# Patient Record
Sex: Female | Born: 1990 | Race: Black or African American | Hispanic: No | Marital: Single | Smoking: Never smoker
Health system: Southern US, Community
[De-identification: ages and names within clinical notes are randomized; demographics above are authoritative.]

## PROBLEM LIST (undated history)

## (undated) ENCOUNTER — Inpatient Hospital Stay (HOSPITAL_COMMUNITY): Payer: Self-pay

## (undated) DIAGNOSIS — Z8614 Personal history of Methicillin resistant Staphylococcus aureus infection: Secondary | ICD-10-CM

## (undated) DIAGNOSIS — R87629 Unspecified abnormal cytological findings in specimens from vagina: Secondary | ICD-10-CM

## (undated) DIAGNOSIS — B999 Unspecified infectious disease: Secondary | ICD-10-CM

## (undated) DIAGNOSIS — J45909 Unspecified asthma, uncomplicated: Secondary | ICD-10-CM

## (undated) DIAGNOSIS — A599 Trichomoniasis, unspecified: Secondary | ICD-10-CM

## (undated) DIAGNOSIS — R55 Syncope and collapse: Secondary | ICD-10-CM

## (undated) HISTORY — PX: ELBOW SURGERY: SHX618

## (undated) HISTORY — PX: OTHER SURGICAL HISTORY: SHX169

## (undated) HISTORY — PX: GYNECOLOGIC CRYOSURGERY: SHX857

## (undated) HISTORY — PX: FOOT SURGERY: SHX648

---
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
## 2012-03-21 ENCOUNTER — Encounter (HOSPITAL_COMMUNITY): Payer: Self-pay | Admitting: *Deleted

## 2012-03-21 ENCOUNTER — Inpatient Hospital Stay (HOSPITAL_COMMUNITY)
Admission: AD | Admit: 2012-03-21 | Discharge: 2012-03-21 | Disposition: A | Discharge status: Home / Self Care | Payer: Self-pay | Source: Ambulatory Visit | Attending: Obstetrics | Admitting: Obstetrics

## 2012-03-21 DIAGNOSIS — N912 Amenorrhea, unspecified: Secondary | ICD-10-CM | POA: Insufficient documentation

## 2012-03-21 DIAGNOSIS — Z3202 Encounter for pregnancy test, result negative: Secondary | ICD-10-CM | POA: Insufficient documentation

## 2012-03-21 LAB — URINALYSIS, ROUTINE W REFLEX MICROSCOPIC
Bilirubin Urine: NEGATIVE
Leukocytes, UA: NEGATIVE
Nitrite: NEGATIVE
Specific Gravity, Urine: 1.025 (ref 1.005–1.030)
pH: 6 (ref 5.0–8.0)

## 2012-03-21 LAB — POCT PREGNANCY, URINE: Preg Test, Ur: NEGATIVE

## 2012-03-21 NOTE — Discharge Instructions (Signed)
Condoms always for intercourse Make an appointment with Dr. Gaynell Face for additional contraception.

## 2012-03-21 NOTE — MAU Note (Signed)
Patient states she had her last Depo in July, 2012 and has not had a period since that time. States she had vomiting last night after eating shrimp and has been having abdominal pain off and on.

## 2012-03-21 NOTE — MAU Provider Note (Signed)
History     CSN: [id number]  Arrival date and time: 03/21/12 1357   First Provider Initiated Contact with Patient 03/21/12 1455      Chief Complaint  Patient presents with  . Possible Pregnancy   HPI Rachael Oliver 21 y.o. Comes to MAU with no menses for several months.  Stopped Depo in 2012 and has not yet started her menses.  Wondering if she was pregnant.  Does not use condoms.  Had vomiting yesterday after eating shrimp.  Had pancakes today and has had no vomiting today.  Having some lower abdominal tightening but is in no distress.  OB History    Grav Para Term Preterm Abortions TAB SAB Ect Mult Living   1 1 1       1       Past Medical History  Diagnosis Date  . No pertinent past medical history     Past Surgical History  Procedure Date  . Foot surgery     History reviewed. No pertinent family history.  History  Substance Use Topics  . Smoking status: Never Smoker   . Smokeless tobacco: Not on file  . Alcohol Use: No    Allergies: No Known Allergies  Prescriptions prior to admission  Medication Sig Dispense Refill  . ALBUTEROL IN Inhale 2 puffs into the lungs daily as needed. For shortness of breath.        Review of Systems  Gastrointestinal: Negative for nausea, vomiting and abdominal pain.  Genitourinary: Negative for dysuria.   Physical Exam   Blood pressure 139/72, pulse 77, temperature 98.2 F (36.8 C), temperature source Oral, resp. rate 16, height 5\' 4"  (1.626 m), weight 129 lb 12.8 oz (58.877 kg), SpO2 100.00%.  Physical Exam  Nursing note and vitals reviewed. Constitutional: She is oriented to person, place, and time. She appears well-developed and well-nourished.  HENT:  Head: Normocephalic.  Eyes: EOM are normal.  Neck: Neck supple.  Musculoskeletal: Normal range of motion.  Neurological: She is alert and oriented to person, place, and time.  Skin: Skin is warm and dry.  Psychiatric: She has a normal mood and affect.    MAU  Course  Procedures  MDM Results for orders placed during the hospital encounter of 03/21/12 (from the past 24 hour(s))  URINALYSIS, ROUTINE W REFLEX MICROSCOPIC     Status: Abnormal   Collection Time   03/21/12  2:20 PM      Component Value Range   Color, Urine YELLOW  YELLOW    APPearance HAZY (*) CLEAR    Specific Gravity, Urine 1.025  1.005 - 1.030    pH 6.0  5.0 - 8.0    Glucose, UA NEGATIVE  NEGATIVE (mg/dL)   Hgb urine dipstick NEGATIVE  NEGATIVE    Bilirubin Urine NEGATIVE  NEGATIVE    Ketones, ur NEGATIVE  NEGATIVE (mg/dL)   Protein, ur NEGATIVE  NEGATIVE (mg/dL)   Urobilinogen, UA 0.2  0.0 - 1.0 (mg/dL)   Nitrite NEGATIVE  NEGATIVE    Leukocytes, UA NEGATIVE  NEGATIVE   POCT PREGNANCY, URINE     Status: Normal   Collection Time   03/21/12  2:34 PM      Component Value Range   Preg Test, Ur NEGATIVE  NEGATIVE    Client wanted only a pregnancy test.  Is satisfied with negative results.  No vomiting today.  Assessment and Plan  Negative pregnancy test  Plan Condoms always for intercourse Make an appointment with Dr. Gaynell Face for additional  contraception.   Rachael Oliver 03/21/2012, 3:02 PM

## 2012-05-18 ENCOUNTER — Inpatient Hospital Stay (HOSPITAL_COMMUNITY)
Admission: AD | Admit: 2012-05-18 | Discharge: 2012-05-19 | Disposition: A | Discharge status: Home / Self Care | Payer: Medicaid Other | Source: Ambulatory Visit | Attending: Obstetrics | Admitting: Obstetrics

## 2012-05-18 DIAGNOSIS — R109 Unspecified abdominal pain: Secondary | ICD-10-CM

## 2012-05-18 DIAGNOSIS — B9689 Other specified bacterial agents as the cause of diseases classified elsewhere: Secondary | ICD-10-CM | POA: Insufficient documentation

## 2012-05-18 DIAGNOSIS — O26899 Other specified pregnancy related conditions, unspecified trimester: Secondary | ICD-10-CM

## 2012-05-18 DIAGNOSIS — O2390 Unspecified genitourinary tract infection in pregnancy, unspecified trimester: Secondary | ICD-10-CM | POA: Insufficient documentation

## 2012-05-18 DIAGNOSIS — A499 Bacterial infection, unspecified: Secondary | ICD-10-CM | POA: Insufficient documentation

## 2012-05-18 DIAGNOSIS — N760 Acute vaginitis: Secondary | ICD-10-CM | POA: Insufficient documentation

## 2012-05-18 HISTORY — DX: Unspecified asthma, uncomplicated: J45.909

## 2012-05-18 LAB — POCT PREGNANCY, URINE: Preg Test, Ur: POSITIVE — AB

## 2012-05-18 NOTE — MAU Note (Signed)
Pt states, " I miss my June period and now I started having a pulling sensation in my low abdomen for two days."

## 2012-05-19 ENCOUNTER — Inpatient Hospital Stay (HOSPITAL_COMMUNITY): Payer: Medicaid Other

## 2012-05-19 ENCOUNTER — Encounter (HOSPITAL_COMMUNITY): Payer: Self-pay

## 2012-05-19 LAB — WET PREP, GENITAL

## 2012-05-19 LAB — URINALYSIS, ROUTINE W REFLEX MICROSCOPIC
Glucose, UA: NEGATIVE mg/dL
Ketones, ur: 15 mg/dL — AB
Nitrite: NEGATIVE
Protein, ur: NEGATIVE mg/dL
Urobilinogen, UA: 0.2 mg/dL (ref 0.0–1.0)

## 2012-05-19 LAB — CBC
HCT: 34.8 % — ABNORMAL LOW (ref 36.0–46.0)
Hemoglobin: 11.6 g/dL — ABNORMAL LOW (ref 12.0–15.0)
MCH: 26.5 pg (ref 26.0–34.0)
MCHC: 33.3 g/dL (ref 30.0–36.0)
MCV: 79.6 fL (ref 78.0–100.0)
RBC: 4.37 MIL/uL (ref 3.87–5.11)

## 2012-05-19 MED ORDER — METRONIDAZOLE 500 MG PO TABS: 500.0000 mg | ORAL_TABLET | Freq: Two times a day (BID) | ORAL | Status: AC

## 2012-05-19 NOTE — MAU Provider Note (Signed)
History     CSN: [id number]  Arrival date and time: 05/18/12 2318   First Provider Initiated Contact with Patient 05/19/12 0022      Chief Complaint  Patient presents with  . Abdominal Pain  . Amenorrhea   HPI  Pt is [redacted]w[redacted]d and complains of lower abdominal pain that comes and goes.  The pain hurts all day.  The pt has not taken anything for pain.  The pain is 9/10 at its worst then subsides.  The pain lasts for about 3 minutes when it comes.  Pt was on Depo last year with resuming of menses in February.  She denies vaginal discharge, spotting or bleeding, constipation or diarrhea.She has a throbbing headache in the back of her head. She is sensitive to light but no nausea or vomiting, denies blurred vision.  She is hungry all the time.  She has not taken anything for the pain.  Past Medical History  Diagnosis Date  . Asthma     Past Surgical History  Procedure Date  . Foot surgery     History reviewed. No pertinent family history.  History  Substance Use Topics  . Smoking status: Never Smoker   . Smokeless tobacco: Not on file  . Alcohol Use: No    Allergies: No Known Allergies  Prescriptions prior to admission  Medication Sig Dispense Refill  . ALBUTEROL IN Inhale 2 puffs into the lungs daily as needed. For shortness of breath.        Review of Systems  Constitutional: Negative for fever and chills.  Eyes: Negative for blurred vision.  Gastrointestinal: Positive for abdominal pain. Negative for nausea and vomiting.  Genitourinary: Negative for dysuria, urgency and frequency.  Neurological: Positive for headaches.   Physical Exam   Blood pressure 100/76, pulse 94, temperature 98.7 F (37.1 C), temperature source Oral, resp. rate 16, height 5' 4.5" (1.638 m), weight 130 lb (58.968 kg), last menstrual period 03/25/2012.  Physical Exam  Nursing note and vitals reviewed. Constitutional: She is oriented to person, place, and time. She appears well-developed and  well-nourished.  HENT:  Head: Normocephalic.  Eyes: Pupils are equal, round, and reactive to light.  Neck: Normal range of motion. Neck supple.  Cardiovascular: Normal rate and regular rhythm.   Respiratory: Effort normal.  GI: Soft. Bowel sounds are normal. She exhibits no distension. There is tenderness. There is no rebound and no guarding.       Mild left lower quadrant tenderness with palpation-no rebound  Genitourinary:       Mod amount of frothy white discharge in vault; uterus soft ~6week size, nontender; adnexal without palpable enlargement or tenderness  Musculoskeletal: Normal range of motion.  Neurological: She is alert and oriented to person, place, and time.  Skin: Skin is warm and dry.  Psychiatric: She has a normal mood and affect.    MAU Course  Procedures Results for orders placed during the hospital encounter of 05/18/12 (from the past 24 hour(s))  URINALYSIS, ROUTINE W REFLEX MICROSCOPIC     Status: Abnormal   Collection Time   05/18/12 11:54 PM      Component Value Range   Color, Urine YELLOW  YELLOW   APPearance CLEAR  CLEAR   Specific Gravity, Urine >1.030 (*) 1.005 - 1.030   pH 6.0  5.0 - 8.0   Glucose, UA NEGATIVE  NEGATIVE mg/dL   Hgb urine dipstick NEGATIVE  NEGATIVE   Bilirubin Urine NEGATIVE  NEGATIVE   Ketones, ur 15 (*)  NEGATIVE mg/dL   Protein, ur NEGATIVE  NEGATIVE mg/dL   Urobilinogen, UA 0.2  0.0 - 1.0 mg/dL   Nitrite NEGATIVE  NEGATIVE   Leukocytes, UA SMALL (*) NEGATIVE  URINE MICROSCOPIC-ADD ON     Status: Abnormal   Collection Time   05/18/12 11:54 PM      Component Value Range   Squamous Epithelial / LPF RARE  RARE   WBC, UA 3-6  <3 WBC/hpf   RBC / HPF 3-6  <3 RBC/hpf   Bacteria, UA FEW (*) RARE  POCT PREGNANCY, URINE     Status: Abnormal   Collection Time   05/19/12 12:00 AM      Component Value Range   Preg Test, Ur POSITIVE (*) NEGATIVE  CBC     Status: Abnormal   Collection Time   05/19/12 12:31 AM      Component Value Range     WBC 5.5  4.0 - 10.5 K/uL   RBC 4.37  3.87 - 5.11 MIL/uL   Hemoglobin 11.6 (*) 12.0 - 15.0 g/dL   HCT 04.5 (*) 40.9 - 81.1 %   MCV 79.6  78.0 - 100.0 fL   MCH 26.5  26.0 - 34.0 pg   MCHC 33.3  30.0 - 36.0 g/dL   RDW 91.4  78.2 - 95.6 %   Platelets 216  150 - 400 K/uL  HCG, QUANTITATIVE, PREGNANCY     Status: Abnormal   Collection Time   05/19/12 12:31 AM      Component Value Range   hCG, Beta Chain, Quant, S 21308 (*) <5 mIU/mL  WET PREP, GENITAL     Status: Abnormal   Collection Time   05/19/12  1:52 AM      Component Value Range   Yeast Wet Prep HPF POC NONE SEEN  NONE SEEN   Trich, Wet Prep NONE SEEN  NONE SEEN   Clue Cells Wet Prep HPF POC FEW (*) NONE SEEN   WBC, Wet Prep HPF POC MANY (*) NONE SEEN   Technique: Transabdominal ultrasound was performed for evaluation  of the gestation as well as the maternal uterus and adnexal  regions.  Comparison: None.  Intrauterine gestational sac: Visualized/normal in shape.  Yolk sac: Identified  Embryo: Identified  Cardiac Activity: Identified  Heart Rate: 149 bpm  CRL: 7.5 mm six w five d [place] EDC: 01/07/2013  Maternal uterus/Adnexae:  Normal sonographic appearance. No subchorionic hemorrhage  identified. No free fluid.  IMPRESSION:  Single intrauterine gestation with cardiac activity documented.  Estimated age of 6 weeks 5 days by crown-rump length.  Original Report Authenticated By: Waneta Martins, M.D.    Assessment and Plan  Abd pain in pregnancy IUP [redacted]w[redacted]d Bacterial vaginosis-Flagyl 500mg  BID for 7 days F/u with Dr. Gaynell Face for [facility] care  [facility] 05/19/2012, 12:22 AM

## 2012-05-21 LAB — GC/CHLAMYDIA PROBE AMP, GENITAL: GC Probe Amp, Genital: NEGATIVE

## 2012-05-24 ENCOUNTER — Telehealth: Payer: Self-pay | Admitting: Physician Assistant

## 2012-05-24 NOTE — Telephone Encounter (Signed)
na

## 2012-07-28 ENCOUNTER — Encounter (HOSPITAL_COMMUNITY): Payer: Self-pay | Admitting: *Deleted

## 2012-07-28 ENCOUNTER — Emergency Department (HOSPITAL_COMMUNITY)
Admission: EM | Admit: 2012-07-28 | Discharge: 2012-07-28 | Disposition: A | Discharge status: Home / Self Care | Payer: Medicaid Other | Source: Home / Self Care

## 2012-07-28 DIAGNOSIS — B86 Scabies: Secondary | ICD-10-CM

## 2012-07-28 MED ORDER — PERMETHRIN 5 % EX CREA: TOPICAL_CREAM | CUTANEOUS | Status: DC

## 2012-07-28 NOTE — ED Provider Notes (Signed)
History     CSN: [id number]  Arrival date & time 07/28/12  1313   None     Chief Complaint  Patient presents with  . Pruritis    (Consider location/radiation/quality/duration/timing/severity/associated sxs/prior treatment) HPI Comments: 21 year old female that presents  with her child up from both her experiencing itching. The mother states that the father of the child was diagnosed with scabies at the urgent care center a few days ago. He has since been in contact with both the mother of the child and the child. The mother states she is scratching all over and has little small red bumps scattered over her body. Some of them were in the web spaces of the hand.   Past Medical History  Diagnosis Date  . Asthma     Past Surgical History  Procedure Date  . Foot surgery     History reviewed. No pertinent family history.  History  Substance Use Topics  . Smoking status: Never Smoker   . Smokeless tobacco: Not on file  . Alcohol Use: No    OB History    Grav Para Term Preterm Abortions TAB SAB Ect Mult Living   2 1 1       1       Review of Systems  Constitutional: Negative for fever, chills and activity change.  HENT: Negative.   Respiratory: Negative.   Cardiovascular: Negative.   Musculoskeletal: Negative.        As per HPI  Skin: Positive for rash. Negative for color change and pallor.  Neurological: Negative.     Allergies  Review of patient's allergies indicates no known allergies.  Home Medications   Current Outpatient Rx  Name Route Sig Dispense Refill  . ALBUTEROL IN Inhalation Inhale 2 puffs into the lungs daily as needed. For shortness of breath.    Marland Kitchen PERMETHRIN 5 % EX CREA  Apply from chin down, leave on for 8-14 hours, rinse. Repeat in 1 week 60 g 0    BP 113/60  Pulse 70  Temp 98.2 F (36.8 C) (Oral)  Resp 17  SpO2 100%  LMP 03/25/2012  Physical Exam  Constitutional: She is oriented to person, place, and time. She appears well-developed  and well-nourished. She appears distressed.  Neck: Normal range of motion. Neck supple.  Musculoskeletal: Normal range of motion. She exhibits no edema and no tenderness.  Neurological: She is alert and oriented to person, place, and time. No cranial nerve deficit.  Skin: Skin is warm and dry.        2 small red papules to the trunk and extremities. There are 2 burrowing-type lesions on the left hand. She is scratching during the exam.     ED Course  Procedures (including critical care time)  Labs Reviewed - No data to display No results found.   1. Scabies       MDM  elimite as directed        Hayden Rasmussen, NP 07/28/12 1441

## 2012-07-28 NOTE — ED Notes (Signed)
Pt with onset of itching no visible rash last night

## 2012-07-29 NOTE — ED Provider Notes (Signed)
Medical screening examination/treatment/procedure(s) were performed by non-physician practitioner and as supervising physician I was immediately available for consultation/collaboration.  Lucelia Lacey, M.D.   Zykerria Tanton C Gustavo Dispenza, MD 07/29/12 0829 

## 2013-06-18 ENCOUNTER — Emergency Department (HOSPITAL_COMMUNITY)
Admission: EM | Admit: 2013-06-18 | Discharge: 2013-06-18 | Disposition: A | Discharge status: Home / Self Care | Payer: Medicaid Other | Attending: Emergency Medicine | Admitting: Emergency Medicine

## 2013-06-18 ENCOUNTER — Encounter (HOSPITAL_COMMUNITY): Payer: Self-pay | Admitting: Emergency Medicine

## 2013-06-18 ENCOUNTER — Emergency Department (HOSPITAL_COMMUNITY): Payer: Medicaid Other

## 2013-06-18 DIAGNOSIS — Y929 Unspecified place or not applicable: Secondary | ICD-10-CM | POA: Insufficient documentation

## 2013-06-18 DIAGNOSIS — Z79899 Other long term (current) drug therapy: Secondary | ICD-10-CM | POA: Insufficient documentation

## 2013-06-18 DIAGNOSIS — S60051A Contusion of right little finger without damage to nail, initial encounter: Secondary | ICD-10-CM

## 2013-06-18 DIAGNOSIS — W2209XA Striking against other stationary object, initial encounter: Secondary | ICD-10-CM | POA: Insufficient documentation

## 2013-06-18 DIAGNOSIS — S6000XA Contusion of unspecified finger without damage to nail, initial encounter: Secondary | ICD-10-CM | POA: Insufficient documentation

## 2013-06-18 DIAGNOSIS — Y939 Activity, unspecified: Secondary | ICD-10-CM | POA: Insufficient documentation

## 2013-06-18 DIAGNOSIS — J45909 Unspecified asthma, uncomplicated: Secondary | ICD-10-CM | POA: Insufficient documentation

## 2013-06-18 MED ORDER — IBUPROFEN 800 MG PO TABS: 800.0000 mg | ORAL_TABLET | Freq: Three times a day (TID) | ORAL | Status: DC

## 2013-06-18 MED ORDER — IBUPROFEN 400 MG PO TABS
800.0000 mg | ORAL_TABLET | Freq: Once | ORAL | Status: AC
  Administered 2013-06-18: 800 mg via ORAL
  Filled 2013-06-18: qty 2

## 2013-06-18 NOTE — Progress Notes (Signed)
Orthopedic Tech Progress Note Patient Details:  Rachael Oliver Sep 04, 1991 [id number]  Ortho Devices Type of Ortho Device: Finger splint Ortho Device/Splint Location: RUE Ortho Device/Splint Interventions: Ordered;Application   [name] 06/18/2013, 4:44 PM

## 2013-06-18 NOTE — ED Notes (Signed)
Pt c/o right pinky finger injury after hitting on furniture

## 2013-06-18 NOTE — ED Notes (Signed)
Patient transported to X-ray 

## 2013-06-18 NOTE — ED Notes (Signed)
Pt. Hit rt. Little finger about 2-3 hours ago.

## 2013-06-18 NOTE — ED Provider Notes (Signed)
CSN: [id number]     Arrival date & time 06/18/13  1447 History  This chart was scribed for non-physician practitioner, Rachael Forth, PA-C working with Rachael Hait, MD by Rachael Oliver, ED scribe. This patient was seen in room TR11C/TR11C and the patient's care was started at 3:23 PM.   Chief Complaint  Patient presents with  . Finger Injury   The history is provided by the patient. No language interpreter was used.    HPI Comments: Rachael Oliver is a 22 y.o. female who presents to the Emergency Department complaining of sudden onset right pinky finger injury with associated swelling and bruising that started 3 hours ago after she dropped a wooden bench onto her finger. She states there is tingling in her pinky, but that she can move it.  Pt denies fever and chills as associated symptoms.    Past Medical History  Diagnosis Date  . Asthma    Past Surgical History  Procedure Laterality Date  . Foot surgery     History reviewed. No pertinent family history. History  Substance Use Topics  . Smoking status: Never Smoker   . Smokeless tobacco: Not on file  . Alcohol Use: No   OB History   Grav Para Term Preterm Abortions TAB SAB Ect Mult Living   2 1 1       1      Review of Systems  Constitutional: Negative for fever, diaphoresis, appetite change, fatigue and unexpected weight change.  HENT: Negative for mouth sores and neck stiffness.   Eyes: Negative for visual disturbance.  Respiratory: Negative for cough, chest tightness, shortness of breath and wheezing.   Cardiovascular: Negative for chest pain.  Gastrointestinal: Negative for nausea, vomiting, abdominal pain, diarrhea and constipation.  Endocrine: Negative for polydipsia, polyphagia and polyuria.  Genitourinary: Negative for dysuria, urgency, frequency and hematuria.  Musculoskeletal: Positive for arthralgias. Negative for back pain.  Skin: Negative for rash.  Allergic/Immunologic: Negative for  immunocompromised state.  Neurological: Negative for syncope, light-headedness, numbness and headaches.  Hematological: Does not bruise/bleed easily.  Psychiatric/Behavioral: Negative for sleep disturbance. The patient is not nervous/anxious.   All other systems reviewed and are negative.    Allergies  Review of patient's allergies indicates no known allergies.  Home Medications   Current Outpatient Rx  Name  Oliver  Sig  Dispense  Refill  . albuterol (PROVENTIL HFA;VENTOLIN HFA) 108 (90 BASE) MCG/ACT inhaler   Inhalation   Inhale 1 puff into the lungs every 6 (six) hours as needed for wheezing.         Marland Kitchen ibuprofen (ADVIL,MOTRIN) 800 MG tablet   Oral   Take 1 tablet (800 mg total) by mouth 3 (three) times daily.   21 tablet   0    BP 124/71  Pulse 72  Temp(Src) 98.1 F (36.7 C) (Oral)  Resp 18  SpO2 99%  Breastfeeding? Unknown  Physical Exam  Nursing note and vitals reviewed. Constitutional: She is oriented to person, place, and time. She appears well-developed and well-nourished. No distress.  HENT:  Head: Normocephalic and atraumatic.  Eyes: Conjunctivae are normal.  Neck: Normal range of motion.  Cardiovascular: Normal rate, regular rhythm, normal heart sounds and intact distal pulses.   No murmur heard. Capillary refill < 3 seconds  Pulmonary/Chest: Effort normal and breath sounds normal. No respiratory distress. She has no wheezes.  Musculoskeletal: She exhibits tenderness. She exhibits no edema.  ROM: Full ROM of PIP and mild decreased ROM of MCP secondary  to pain.  Tenderness beginning at PIP joint and extending down to MCP of right little finger.   Neurological: She is alert and oriented to person, place, and time. Coordination normal.  Sensation intact Strength 5/5 including resisted flexion and extension of the right little finger  Skin: Skin is warm and dry. She is not diaphoretic. No erythema.  No tenting of the skin Ecchymosis of the dorsal aspect of  the right little finger over extending from the MCP joint to the PCP joint  Psychiatric: She has a normal mood and affect.    ED Course   Procedures (including critical care time)  DIAGNOSTIC STUDIES: Oxygen Saturation is 99% on RA, normal by my interpretation.    COORDINATION OF CARE: 4:32 PM-Discussed treatment plan which includes splint with pt at bedside and pt agreed to plan.   Labs Reviewed - No data to display Dg Finger Little Right  06/18/2013   *RADIOLOGY REPORT*  Clinical Data: Pain post trauma  RIGHT FIFTH FINGER 2+V  Comparison: None.  Findings: Frontal, oblique, and lateral views were obtained.  There is no demonstrable fracture or dislocation.  Joint spaces appear intact.  No erosive change.  IMPRESSION: No abnormality noted.   Original Report Authenticated By: Rachael Oliver, M.D.   1. Contusion of right little finger without damage to nail, initial encounter     MDM  Rachael Oliver presents with contusion to the right little finger involving the PIP and MCP joints.  Patient X-Ray negative for obvious fracture or dislocation. I personally reviewed the imaging tests through PACS system.  I reviewed available ER/hospitalization records through the EMR.  Pain managed in ED. Pt advised to follow up with orthopedics if symptoms persist for possibility of missed fracture diagnosis. Patient given finger splint while in ED, conservative therapy recommended and discussed. Patient will be dc home & is agreeable with above plan.  I have also discussed reasons to return immediately to the ER.  Patient expresses understanding and agrees with plan.  I personally performed the services described in this documentation, which was scribed in my presence. The recorded information has been reviewed and is accurate.    Rachael Client Caelyn Route, PA-C 06/18/13 1659

## 2013-06-19 NOTE — ED Provider Notes (Signed)
Medical screening examination/treatment/procedure(s) were performed by non-physician practitioner and as supervising physician I was immediately available for consultation/collaboration.   William Hannelore Bova, MD 06/19/13 0939 

## 2013-07-15 ENCOUNTER — Emergency Department (HOSPITAL_COMMUNITY)
Admission: EM | Admit: 2013-07-15 | Discharge: 2013-07-15 | Disposition: A | Discharge status: Home / Self Care | Payer: Medicaid Other | Attending: Emergency Medicine | Admitting: Emergency Medicine

## 2013-07-15 ENCOUNTER — Encounter (HOSPITAL_COMMUNITY): Payer: Self-pay | Admitting: Emergency Medicine

## 2013-07-15 DIAGNOSIS — N3090 Cystitis, unspecified without hematuria: Secondary | ICD-10-CM | POA: Insufficient documentation

## 2013-07-15 DIAGNOSIS — N898 Other specified noninflammatory disorders of vagina: Secondary | ICD-10-CM | POA: Insufficient documentation

## 2013-07-15 DIAGNOSIS — Z79899 Other long term (current) drug therapy: Secondary | ICD-10-CM | POA: Insufficient documentation

## 2013-07-15 DIAGNOSIS — R109 Unspecified abdominal pain: Secondary | ICD-10-CM | POA: Insufficient documentation

## 2013-07-15 DIAGNOSIS — R319 Hematuria, unspecified: Secondary | ICD-10-CM | POA: Insufficient documentation

## 2013-07-15 DIAGNOSIS — J45909 Unspecified asthma, uncomplicated: Secondary | ICD-10-CM | POA: Insufficient documentation

## 2013-07-15 DIAGNOSIS — O200 Threatened abortion: Secondary | ICD-10-CM

## 2013-07-15 DIAGNOSIS — Z3201 Encounter for pregnancy test, result positive: Secondary | ICD-10-CM

## 2013-07-15 DIAGNOSIS — O9989 Other specified diseases and conditions complicating pregnancy, childbirth and the puerperium: Secondary | ICD-10-CM | POA: Insufficient documentation

## 2013-07-15 LAB — URINE MICROSCOPIC-ADD ON

## 2013-07-15 LAB — URINALYSIS, ROUTINE W REFLEX MICROSCOPIC
Protein, ur: NEGATIVE mg/dL
Urobilinogen, UA: 1 mg/dL (ref 0.0–1.0)

## 2013-07-15 LAB — POCT PREGNANCY, URINE: Preg Test, Ur: POSITIVE — AB

## 2013-07-15 LAB — WET PREP, GENITAL: Yeast Wet Prep HPF POC: NONE SEEN

## 2013-07-15 MED ORDER — CEPHALEXIN 500 MG PO CAPS: 500.0000 mg | ORAL_CAPSULE | Freq: Four times a day (QID) | ORAL | Status: DC

## 2013-07-15 NOTE — ED Provider Notes (Signed)
CSN: [id number]     Arrival date & time 07/15/13  1901 History   First MD Initiated Contact with Patient 07/15/13 2146     Chief Complaint  Patient presents with  . Hematuria  . Vaginal Discharge   (Consider location/radiation/quality/duration/timing/severity/associated sxs/prior Treatment) HPI 22 year old female G2P1102 (SVDs, no issues during pregnancy per pt) that is here with multiple complaints including intermittent lower crampy abdominal pain, blood in her urine and a small amount of dark vaginal bleeding. She reports the abdominal pain began yesterday. It is located in her lower abdomen. She reports it comes and goes. She has no abdominal pain currently.  In regards to her hematuria she reports it has also been present since yesterday. She has not had any dysuria, burning with urination or frequency. Lastly she reported some scant vaginal bleeding. This has also been present since yesterday. She states it has only consisted of a small amount of dark spotting several times in the past 2 days. She denies fevers, chills, nausea, vomiting, diarrhea, loss of appetite, weight change. LMP 06/07/13. Triage UPT was positive.  Past Medical History  Diagnosis Date  . Asthma    Past Surgical History  Procedure Laterality Date  . Foot surgery     No family history on file. History  Substance Use Topics  . Smoking status: Never Smoker   . Smokeless tobacco: Not on file  . Alcohol Use: No   OB History   Grav Para Term Preterm Abortions TAB SAB Ect Mult Living   2 1 1       1      Review of Systems  Constitutional: Negative for fever and chills.  HENT: Negative for congestion and sore throat.   Respiratory: Negative for chest tightness and shortness of breath.   Cardiovascular: Negative for chest pain and palpitations.  Gastrointestinal: Negative for nausea, vomiting, abdominal pain and diarrhea.  Genitourinary: Negative for dysuria, frequency, flank pain, vaginal discharge and vaginal  pain.  Musculoskeletal: Negative for joint swelling and gait problem.  Skin: Negative for rash.  Hematological: Does not bruise/bleed easily.  All other systems reviewed and are negative.    Allergies  Review of patient's allergies indicates no known allergies.  Home Medications   Current Outpatient Rx  Name  Route  Sig  Dispense  Refill  . albuterol (PROVENTIL HFA;VENTOLIN HFA) 108 (90 BASE) MCG/ACT inhaler   Inhalation   Inhale 1 puff into the lungs every 6 (six) hours as needed for wheezing.          BP 129/63  Pulse 87  Temp(Src) 98.7 F (37.1 C) (Oral)  Resp 16  Ht 5\' 4"  (1.626 m)  SpO2 99%  LMP 06/15/2013 Physical Exam  Vitals reviewed. Constitutional: She is oriented to person, place, and time. She appears well-developed and well-nourished.  HENT:  Right Ear: External ear normal.  Left Ear: External ear normal.  Mouth/Throat: No oropharyngeal exudate.  Eyes: Conjunctivae and EOM are normal.  Neck: Normal range of motion. Neck supple.  Cardiovascular: Normal rate, regular rhythm, normal heart sounds and intact distal pulses.  Exam reveals no gallop and no friction rub.   No murmur heard. Pulmonary/Chest: Effort normal and breath sounds normal.  Abdominal: Soft. Bowel sounds are normal. She exhibits no distension. There is no tenderness. There is no rebound and no guarding.  Genitourinary: There is no rash, tenderness or lesion on the right labia. There is no rash, tenderness or lesion on the left labia. Cervix exhibits no motion tenderness,  no discharge and no friability. Right adnexum displays no mass and no tenderness. Left adnexum displays no mass and no tenderness. No erythema or tenderness around the vagina. Vaginal discharge (scant, white) found.  Musculoskeletal: Normal range of motion. She exhibits no edema.  Lymphadenopathy:       Right: No inguinal adenopathy present.       Left: No inguinal adenopathy present.  Neurological: She is alert and oriented  to person, place, and time. No cranial nerve deficit.  Skin: Skin is warm and dry. No rash noted.  Psychiatric: She has a normal mood and affect.    ED Course  Procedures (including critical care time) Labs Review Labs Reviewed  WET PREP, GENITAL - Abnormal; Notable for the following:    Clue Cells Wet Prep HPF POC FEW (*)    WBC, Wet Prep HPF POC TOO NUMEROUS TO COUNT (*)    All other components within normal limits  URINALYSIS, ROUTINE W REFLEX MICROSCOPIC - Abnormal; Notable for the following:    Hgb urine dipstick TRACE (*)    Leukocytes, UA SMALL (*)    All other components within normal limits  URINE MICROSCOPIC-ADD ON - Abnormal; Notable for the following:    Bacteria, UA FEW (*)    All other components within normal limits  HCG, QUANTITATIVE, PREGNANCY - Abnormal; Notable for the following:    hCG, Beta Chain, Quant, S 191 (*)    All other components within normal limits  POCT PREGNANCY, URINE - Abnormal; Notable for the following:    Preg Test, Ur POSITIVE (*)    All other components within normal limits  GC/CHLAMYDIA PROBE AMP  URINE CULTURE  ABO/RH   Imaging Review No results found.  MDM   22 year old female here with crampy abdominal pain, scant vaginal bleeding and hematuria. UPT from triage was positive. She has no abdominal pain currently. Her abdominal exam is benign. Pelvic exam was benign. She does not know her blood type. Pregnancy screen ordered. Given how early she is in her pregnancy beta hCG Quant was ordered. Will make decisions regarding whether or not to obtain ultrasound imaging based on this value.  Will tx as cystitis.  11:39 PM beta Quant came back at 191. We do not feel that an ultrasound we'll review the location of her pregnancy. She had absolutely no evidence of bleeding on her pelvic exam. Her abdominal exam is completely benign. This felt at this time she is safe and stable for discharge. She indicated that she will followup with her OB/GYN.  Strict return precautions were reviewed with the patient including worsening abdominal pain and increased bleeding.  She was also counseled in regard to a diagnosis of threatened miscarriage.  Clinical Impression: 1. Positive pregnancy test   2. Cystitis   3. Threatened miscarriage in early pregnancy     Disposition: Discharge  Condition: Good  I have discussed the results, Dx and Tx plan. They expressed understanding and agree with the plan and were told to return to ED with any worsening of condition or concern.    Discharge Medication List as of 07/15/2013 11:34 PM    START taking these medications   Details  cephALEXin (KEFLEX) 500 MG capsule Take 1 capsule (500 mg total) by mouth 4 (four) times daily., Starting 07/15/2013, Until Discontinued, Print        Follow Up: Followup with your OB/GYN as discussed     Pt seen in conjunction with Dr. Wilkie Aye.  Reine Just. Beverely Pace, MD Emergency  Medicine PGY-III [phone]  Addendum: Rx for Flagyl called in to [facility] on [address]  500 mg BID x 7d #14.  Pt contacted and she is doing well.  No vaginal bleeding or abdominal pain. 4:14 PM 07/16/13    Reine Just. Beverely Pace, MD Emergency Medicine PGY-III  Oleh Genin, MD 07/16/13 [phone]

## 2013-07-15 NOTE — ED Notes (Signed)
Pt. reports vaginal discharge and hematuria onset yesterday with low abdominal cramping. Denies fever or chills.

## 2013-07-15 NOTE — ED Provider Notes (Signed)
I saw and evaluated the patient, reviewed the resident's and I agree with the findings and plan.  This is a 22 year old female who presents with vaginal discharge or bleeding. The patient is approximately [redacted] weeks pregnant with last menstrual period on August 1. She reports one-day history of vaginal bleeding. The patient denied any abdominal pain or lateralizing pain to me. She is nontoxic-appearing and her vital signs are within normal limits. Her abdominal exam is benign and her the resident's note, her pelvic exam is benign as well without evidence of bleeding or adnexal tenderness. Beta hCG was 191. I discussed with the patient that at this level, I would not expect to see an intrauterine pregnancy. However, also cannot rule out miscarriage versus ectopic pregnancy. Given the patient's reassuring exam, she will need followup for repeat beta and ultrasound. The patient was given strict return precautions if she begins having abdominal pain, worsening bleeding, or any other concerning symptoms. Patient stated understanding.  After history, exam, and medical workup I feel the patient has been appropriately medically screened and is safe for discharge home. Pertinent diagnoses were discussed with the patient. Patient was given return precautions.    Shon Baton, MD 07/15/13 [phone]

## 2013-07-15 NOTE — ED Notes (Signed)
Patient noticed yesterday at noon, a discharge, odor from vagina, hematuria, and pain associated, LMP 06/15/13, no birth control, patient is sexually active.

## 2013-07-16 LAB — URINE CULTURE: Culture: NO GROWTH

## 2013-07-16 LAB — GC/CHLAMYDIA PROBE AMP: GC Probe RNA: NEGATIVE

## 2013-07-16 NOTE — ED Provider Notes (Signed)
I saw and evaluated the patient, reviewed the resident's note and I agree with the findings and plan.  Shon Baton, MD 07/16/13 [phone]

## 2013-08-08 ENCOUNTER — Inpatient Hospital Stay (HOSPITAL_COMMUNITY)
Admission: AD | Admit: 2013-08-08 | Discharge: 2013-08-08 | Disposition: A | Discharge status: Home / Self Care | Payer: Medicaid Other | Source: Ambulatory Visit | Attending: Obstetrics | Admitting: Obstetrics

## 2013-08-08 ENCOUNTER — Inpatient Hospital Stay (HOSPITAL_COMMUNITY): Payer: Medicaid Other

## 2013-08-08 ENCOUNTER — Encounter (HOSPITAL_COMMUNITY): Payer: Self-pay

## 2013-08-08 DIAGNOSIS — O26891 Other specified pregnancy related conditions, first trimester: Secondary | ICD-10-CM

## 2013-08-08 DIAGNOSIS — N949 Unspecified condition associated with female genital organs and menstrual cycle: Secondary | ICD-10-CM | POA: Insufficient documentation

## 2013-08-08 DIAGNOSIS — O99891 Other specified diseases and conditions complicating pregnancy: Secondary | ICD-10-CM | POA: Insufficient documentation

## 2013-08-08 DIAGNOSIS — O9989 Other specified diseases and conditions complicating pregnancy, childbirth and the puerperium: Secondary | ICD-10-CM

## 2013-08-08 DIAGNOSIS — R109 Unspecified abdominal pain: Secondary | ICD-10-CM | POA: Insufficient documentation

## 2013-08-08 LAB — URINALYSIS, ROUTINE W REFLEX MICROSCOPIC
Bilirubin Urine: NEGATIVE
Hgb urine dipstick: NEGATIVE
Ketones, ur: NEGATIVE mg/dL
Protein, ur: NEGATIVE mg/dL
Urobilinogen, UA: 0.2 mg/dL (ref 0.0–1.0)

## 2013-08-08 LAB — CBC
MCH: 26.5 pg (ref 26.0–34.0)
MCHC: 33.5 g/dL (ref 30.0–36.0)
MCV: 79 fL (ref 78.0–100.0)
Platelets: 262 10*3/uL (ref 150–400)
RDW: 13.7 % (ref 11.5–15.5)
WBC: 5.1 10*3/uL (ref 4.0–10.5)

## 2013-08-08 NOTE — MAU Note (Signed)
Pt states she has lower abdominal pain (rates 9 out of 10) and feels weak.

## 2013-08-08 NOTE — MAU Note (Signed)
Patient states she has been having lower abdominal pain since last night. States she feels fatigue. Denies bleeding or discharge.

## 2013-08-08 NOTE — MAU Provider Note (Signed)
History     CSN: [id number]  Arrival date and time: 08/08/13 1537   First Provider Initiated Contact with Patient 08/08/13 1646      Chief Complaint  Patient presents with  . Abdominal Pain  . Fatigue   Abdominal Pain    Rachael Oliver is a 22 y.o. G3P1001 at [redacted]w[redacted]d who presents today with feeling weak and abdominal pain. She had a + pregnancy test on 9/8. She states that she has not been vomiting. She denies any bleeding. She states that she has intermittent 9/10 cramping. She states that the cramping started last night. She states that she last had intercourse about 2 weeks ago. She is planning on seeing Dr. Gaynell Face for [facility].   Past Medical History  Diagnosis Date  . Asthma     Past Surgical History  Procedure Laterality Date  . Foot surgery      Family History  Problem Relation Age of Onset  . Alcohol abuse Neg Hx   . Arthritis Neg Hx   . Asthma Neg Hx   . Birth defects Neg Hx   . COPD Neg Hx   . Cancer Neg Hx   . Depression Neg Hx   . Diabetes Neg Hx   . Drug abuse Neg Hx   . Early death Neg Hx   . Hearing loss Neg Hx   . Heart disease Neg Hx   . Hyperlipidemia Neg Hx   . Hypertension Neg Hx   . Kidney disease Neg Hx   . Learning disabilities Neg Hx   . Mental illness Neg Hx   . Mental retardation Neg Hx   . Miscarriages / Stillbirths Neg Hx   . Stroke Neg Hx   . Vision loss Neg Hx     History  Substance Use Topics  . Smoking status: Never Smoker   . Smokeless tobacco: Not on file  . Alcohol Use: No    Allergies: No Known Allergies  Prescriptions prior to admission  Medication Sig Dispense Refill  . albuterol (PROVENTIL HFA;VENTOLIN HFA) 108 (90 BASE) MCG/ACT inhaler Inhale 1 puff into the lungs every 6 (six) hours as needed for wheezing.      . cephALEXin (KEFLEX) 500 MG capsule Take 1 capsule (500 mg total) by mouth 4 (four) times daily.  12 capsule  0    Review of Systems  Gastrointestinal: Positive for abdominal pain.   Physical Exam    Blood pressure 107/53, pulse 74, temperature 98.3 F (36.8 C), temperature source Oral, resp. rate 16, height 5' 4.5" (1.638 m), weight 58.514 kg (129 lb), last menstrual period 06/15/2013, SpO2 100.00%.  Physical Exam  Nursing note and vitals reviewed. Constitutional: She is oriented to person, place, and time. She appears well-developed and well-nourished. No distress.  Cardiovascular: Normal rate.   Respiratory: Effort normal.  GI: Soft. There is no tenderness.  Neurological: She is alert and oriented to person, place, and time.  Skin: Skin is warm.  Psychiatric: She has a normal mood and affect.    MAU Course  Procedures  Results for orders placed during the hospital encounter of 08/08/13 (from the past 24 hour(s))  URINALYSIS, ROUTINE W REFLEX MICROSCOPIC     Status: Abnormal   Collection Time    08/08/13  4:10 PM      Result Value Range   Color, Urine YELLOW  YELLOW   APPearance HAZY (*) CLEAR   Specific Gravity, Urine 1.025  1.005 - 1.030   pH 6.5  5.0 -  8.0   Glucose, UA NEGATIVE  NEGATIVE mg/dL   Hgb urine dipstick NEGATIVE  NEGATIVE   Bilirubin Urine NEGATIVE  NEGATIVE   Ketones, ur NEGATIVE  NEGATIVE mg/dL   Protein, ur NEGATIVE  NEGATIVE mg/dL   Urobilinogen, UA 0.2  0.0 - 1.0 mg/dL   Nitrite NEGATIVE  NEGATIVE   Leukocytes, UA SMALL (*) NEGATIVE  URINE MICROSCOPIC-ADD ON     Status: Abnormal   Collection Time    08/08/13  4:10 PM      Result Value Range   Squamous Epithelial / LPF FEW (*) RARE   WBC, UA 3-6  <3 WBC/hpf   Urine-Other MUCOUS PRESENT    CBC     Status: Abnormal   Collection Time    08/08/13  4:45 PM      Result Value Range   WBC 5.1  4.0 - 10.5 K/uL   RBC 3.96  3.87 - 5.11 MIL/uL   Hemoglobin 10.5 (*) 12.0 - 15.0 g/dL   HCT 04.5 (*) 40.9 - 81.1 %   MCV 79.0  78.0 - 100.0 fL   MCH 26.5  26.0 - 34.0 pg   MCHC 33.5  30.0 - 36.0 g/dL   RDW 91.4  78.2 - 95.6 %   Platelets 262  150 - 400 K/uL  HCG, QUANTITATIVE, PREGNANCY     Status:  Abnormal   Collection Time    08/08/13  4:45 PM      Result Value Range   hCG, Beta Chain, Quant, S 95280 (*) <5 mIU/mL   US Ob Comp Less 14 Wks  08/08/2013   CLINICAL DATA:  Pelvic pain. Uncertain LMP.  EXAM: OBSTETRIC <14 WK ULTRASOUND  TECHNIQUE: Transabdominal ultrasound was performed for evaluation of the gestation as well as the maternal uterus and adnexal regions.  COMPARISON:  None.  FINDINGS: Intrauterine gestational sac: Visualized/normal in shape.  Yolk sac:  Visualized  Embryo:  Visualized  Cardiac Activity: Visualized  Heart Rate: 161 bpm  CRL:   10  mm   7 w 1 d                  [place] EDC: 03/26/14  Maternal uterus/adnexae: No abnormality identified. Both ovaries are normal in appearance.  IMPRESSION: Single living IUP measuring 7 weeks 1 day with [place] EDC of 03/26/14.  No significant maternal uterine or adnexal abnormality identified.   Electronically Signed   By: Myles Rosenthal   On: 08/08/2013 17:44    Assessment and Plan   1. Pelvic pain complicating pregnancy, antepartum, first trimester    Danger signs reviewed [facility] as soon as possible Return to MAU as needed   Tawnya Crook 08/08/2013, 4:47 PM

## 2013-09-20 ENCOUNTER — Emergency Department (HOSPITAL_COMMUNITY): Payer: Medicaid Other

## 2013-09-20 ENCOUNTER — Emergency Department (HOSPITAL_COMMUNITY)
Admission: EM | Admit: 2013-09-20 | Discharge: 2013-09-20 | Disposition: A | Discharge status: Home / Self Care | Payer: Medicaid Other | Attending: Emergency Medicine | Admitting: Emergency Medicine

## 2013-09-20 ENCOUNTER — Encounter (HOSPITAL_COMMUNITY): Payer: Self-pay | Admitting: Emergency Medicine

## 2013-09-20 DIAGNOSIS — J45909 Unspecified asthma, uncomplicated: Secondary | ICD-10-CM | POA: Insufficient documentation

## 2013-09-20 DIAGNOSIS — IMO0002 Reserved for concepts with insufficient information to code with codable children: Secondary | ICD-10-CM | POA: Insufficient documentation

## 2013-09-20 DIAGNOSIS — O9989 Other specified diseases and conditions complicating pregnancy, childbirth and the puerperium: Secondary | ICD-10-CM | POA: Insufficient documentation

## 2013-09-20 DIAGNOSIS — Z79899 Other long term (current) drug therapy: Secondary | ICD-10-CM | POA: Insufficient documentation

## 2013-09-20 DIAGNOSIS — R1310 Dysphagia, unspecified: Secondary | ICD-10-CM | POA: Insufficient documentation

## 2013-09-20 DIAGNOSIS — R319 Hematuria, unspecified: Secondary | ICD-10-CM | POA: Insufficient documentation

## 2013-09-20 DIAGNOSIS — S0990XA Unspecified injury of head, initial encounter: Secondary | ICD-10-CM | POA: Insufficient documentation

## 2013-09-20 LAB — URINE MICROSCOPIC-ADD ON

## 2013-09-20 LAB — URINALYSIS, ROUTINE W REFLEX MICROSCOPIC
Bilirubin Urine: NEGATIVE
Glucose, UA: NEGATIVE mg/dL
Specific Gravity, Urine: 1.017 (ref 1.005–1.030)
pH: 7 (ref 5.0–8.0)

## 2013-09-20 MED ORDER — ACETAMINOPHEN 325 MG PO TABS
650.0000 mg | ORAL_TABLET | Freq: Once | ORAL | Status: AC
  Administered 2013-09-20: 650 mg via ORAL
  Filled 2013-09-20: qty 2
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
## 2013-09-20 NOTE — ED Notes (Signed)
Patient transported to Ultrasound 
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
## 2013-09-20 NOTE — ED Notes (Signed)
Pelvic exam performed by Dr. Rubin Payor with lady nurse chaperone .
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
## 2013-09-20 NOTE — ED Notes (Signed)
Attempted to get FHR but pt too early in pregnancy to obtain.

## 2013-09-20 NOTE — ED Provider Notes (Signed)
6:58 PM I discussed all results with the patient, a colleague, and she was in no distress, with no new complaints. Ultrasound was largely reassuring.  I specifically instructed her in return precautions, follow up instructions. The patient left prior to receiving her paperwork.  Gerhard Munch, MD 09/20/13 [phone]

## 2013-09-21 LAB — URINE CULTURE

## 2013-10-15 ENCOUNTER — Encounter (HOSPITAL_COMMUNITY): Payer: Self-pay

## 2013-10-15 ENCOUNTER — Inpatient Hospital Stay (HOSPITAL_COMMUNITY)
Admission: AD | Admit: 2013-10-15 | Discharge: 2013-10-15 | Disposition: A | Discharge status: Home / Self Care | Payer: Medicaid Other | Source: Ambulatory Visit | Attending: Obstetrics | Admitting: Obstetrics

## 2013-10-15 DIAGNOSIS — B9689 Other specified bacterial agents as the cause of diseases classified elsewhere: Secondary | ICD-10-CM | POA: Insufficient documentation

## 2013-10-15 DIAGNOSIS — N760 Acute vaginitis: Secondary | ICD-10-CM

## 2013-10-15 DIAGNOSIS — O2390 Unspecified genitourinary tract infection in pregnancy, unspecified trimester: Secondary | ICD-10-CM | POA: Insufficient documentation

## 2013-10-15 DIAGNOSIS — A499 Bacterial infection, unspecified: Secondary | ICD-10-CM

## 2013-10-15 DIAGNOSIS — N949 Unspecified condition associated with female genital organs and menstrual cycle: Secondary | ICD-10-CM | POA: Insufficient documentation

## 2013-10-15 LAB — URINALYSIS, ROUTINE W REFLEX MICROSCOPIC
Bilirubin Urine: NEGATIVE
Glucose, UA: NEGATIVE mg/dL
Nitrite: NEGATIVE
Specific Gravity, Urine: 1.02 (ref 1.005–1.030)
Urobilinogen, UA: 0.2 mg/dL (ref 0.0–1.0)

## 2013-10-15 LAB — WET PREP, GENITAL

## 2013-10-15 LAB — URINE MICROSCOPIC-ADD ON

## 2013-10-15 MED ORDER — METRONIDAZOLE 500 MG PO TABS: 500.0000 mg | ORAL_TABLET | Freq: Two times a day (BID) | ORAL | Status: DC

## 2013-10-15 NOTE — MAU Provider Note (Signed)
History     CSN: [id number]  Arrival date and time: 10/15/13 1418   First Provider Initiated Contact with Patient 10/15/13 1637      No chief complaint on file.  HPI  Rachael Oliver is a 22 y.o. female G3P2002 at [redacted]w[redacted]d who presents with vaginal discharge; pt feels she has a yeast infection or "something". The discharge is described as white/yellow with an odor. Denies itching or burning. +fetal movement, denies bleeding.   OB History   Grav Para Term Preterm Abortions TAB SAB Ect Mult Living   3 2 2       2       Past Medical History  Diagnosis Date  . Asthma     Past Surgical History  Procedure Laterality Date  . Foot surgery      Family History  Problem Relation Age of Onset  . Alcohol abuse Neg Hx   . Arthritis Neg Hx   . Asthma Neg Hx   . Birth defects Neg Hx   . COPD Neg Hx   . Cancer Neg Hx   . Depression Neg Hx   . Diabetes Neg Hx   . Drug abuse Neg Hx   . Early death Neg Hx   . Hearing loss Neg Hx   . Heart disease Neg Hx   . Hyperlipidemia Neg Hx   . Hypertension Neg Hx   . Kidney disease Neg Hx   . Learning disabilities Neg Hx   . Mental illness Neg Hx   . Mental retardation Neg Hx   . Miscarriages / Stillbirths Neg Hx   . Stroke Neg Hx   . Vision loss Neg Hx     History  Substance Use Topics  . Smoking status: Never Smoker   . Smokeless tobacco: Not on file  . Alcohol Use: No    Allergies: No Known Allergies  Prescriptions prior to admission  Medication Sig Dispense Refill  . acetaminophen (TYLENOL) 325 MG tablet Take 325 mg by mouth every 6 (six) hours as needed.       Results for orders placed during the hospital encounter of 10/15/13 (from the past 24 hour(s))  WET PREP, GENITAL     Status: Abnormal   Collection Time    10/15/13  2:50 PM      Result Value Range   Yeast Wet Prep HPF POC NONE SEEN  NONE SEEN   Trich, Wet Prep NONE SEEN  NONE SEEN   Clue Cells Wet Prep HPF POC FEW (*) NONE SEEN   WBC, Wet Prep HPF POC  MODERATE (*) NONE SEEN  URINALYSIS, ROUTINE W REFLEX MICROSCOPIC     Status: Abnormal   Collection Time    10/15/13  2:57 PM      Result Value Range   Color, Urine YELLOW  YELLOW   APPearance CLEAR  CLEAR   Specific Gravity, Urine 1.020  1.005 - 1.030   pH 6.0  5.0 - 8.0   Glucose, UA NEGATIVE  NEGATIVE mg/dL   Hgb urine dipstick NEGATIVE  NEGATIVE   Bilirubin Urine NEGATIVE  NEGATIVE   Ketones, ur NEGATIVE  NEGATIVE mg/dL   Protein, ur NEGATIVE  NEGATIVE mg/dL   Urobilinogen, UA 0.2  0.0 - 1.0 mg/dL   Nitrite NEGATIVE  NEGATIVE   Leukocytes, UA LARGE (*) NEGATIVE  URINE MICROSCOPIC-ADD ON     Status: Abnormal   Collection Time    10/15/13  2:57 PM      Result Value Range  Squamous Epithelial / LPF MANY (*) RARE   WBC, UA 3-6  <3 WBC/hpf   RBC / HPF 0-2  <3 RBC/hpf   Bacteria, UA FEW (*) RARE    Review of Systems  Constitutional: Negative for fever and chills.  Gastrointestinal: Negative for nausea, vomiting, abdominal pain, diarrhea and constipation.  Genitourinary: Negative for dysuria, urgency, frequency and hematuria.       + vaginal discharge; yellow/white with an odor No vaginal bleeding. No dysuria.    Physical Exam   Blood pressure 114/61, pulse 71, temperature 98.6 F (37 C), temperature source Oral, resp. rate 16, last menstrual period 06/15/2013, SpO2 99.00%.  Physical Exam  Constitutional: She is oriented to person, place, and time. She appears well-developed and well-nourished. No distress.  HENT:  Head: Normocephalic.  Eyes: Pupils are equal, round, and reactive to light.  Neck: Neck supple.  Respiratory: Effort normal.  GI: Soft.  Genitourinary: Vaginal discharge found.  Speculum exam: Vagina - Small amount of creamy discharge, strong odor Cervix - + contact bleeding Bimanual exam: Cervix closed, no CMT Uterus non tender, normal size; gravid  Adnexa non tender, no masses bilaterally GC/Chlam, wet prep done Chaperone present for exam.    Neurological: She is alert and oriented to person, place, and time.  Skin: Skin is warm. She is not diaphoretic.  Psychiatric: Her behavior is normal.    MAU Course  Procedures None  MDM Wet prep GC/Chlamydia  +fht  Assessment and Plan   A: 1. BV (bacterial vaginosis)     P: Discharge home RX: Flagyl Follow up with Dr. Gaynell Face Return to MAU as needed  Rachael Hansen Kaleena Corrow, NP  10/15/2013, 4:37 PM

## 2013-10-16 LAB — URINE CULTURE: Colony Count: NO GROWTH

## 2013-10-16 LAB — GC/CHLAMYDIA PROBE AMP
CT Probe RNA: POSITIVE — AB
GC Probe RNA: NEGATIVE

## 2013-10-17 ENCOUNTER — Emergency Department (HOSPITAL_COMMUNITY)
Admission: EM | Admit: 2013-10-17 | Discharge: 2013-10-17 | Disposition: A | Discharge status: Home / Self Care | Payer: Medicaid Other | Attending: Emergency Medicine | Admitting: Emergency Medicine

## 2013-10-17 ENCOUNTER — Encounter (HOSPITAL_COMMUNITY): Payer: Self-pay | Admitting: Emergency Medicine

## 2013-10-17 DIAGNOSIS — Z792 Long term (current) use of antibiotics: Secondary | ICD-10-CM | POA: Insufficient documentation

## 2013-10-17 DIAGNOSIS — A749 Chlamydial infection, unspecified: Secondary | ICD-10-CM | POA: Insufficient documentation

## 2013-10-17 DIAGNOSIS — N898 Other specified noninflammatory disorders of vagina: Secondary | ICD-10-CM | POA: Insufficient documentation

## 2013-10-17 DIAGNOSIS — J45909 Unspecified asthma, uncomplicated: Secondary | ICD-10-CM | POA: Insufficient documentation

## 2013-10-17 MED ORDER — AZITHROMYCIN 250 MG PO TABS
1000.0000 mg | ORAL_TABLET | Freq: Once | ORAL | Status: AC
  Administered 2013-10-17: 1000 mg via ORAL
  Filled 2013-10-17: qty 4

## 2013-10-17 NOTE — ED Provider Notes (Signed)
CSN: [id number]     Arrival date & time 10/17/13  1542 History  This chart was scribed for non-physician practitioner Jaynie Crumble, PA-C, working with Gerhard Munch, MD by Dorothey Baseman, ED Scribe. This patient was seen in room TR05C/TR05C and the patient's care was started at 4:44 PM.    Chief Complaint  Patient presents with  . Exposure to STD   The history is provided by the patient. No language interpreter was used.   HPI Comments: Rachael Oliver is a 22 y.o. female who presents to the Emergency Department complaining of STD exposure including associated vaginal discharge onset a few days ago. Patient was seen at the [facility] 2 days ago for similar complaints and was discharged with Flagyl, but she states that they called her today and advised her to come to the ED to be treated for a positive test for chlamydia. Patient reports that she is currently about 17-[redacted] weeks pregnant. She denies abdominal pain.  Past Medical History  Diagnosis Date  . Asthma    Past Surgical History  Procedure Laterality Date  . Foot surgery     Family History  Problem Relation Age of Onset  . Alcohol abuse Neg Hx   . Arthritis Neg Hx   . Asthma Neg Hx   . Birth defects Neg Hx   . COPD Neg Hx   . Cancer Neg Hx   . Depression Neg Hx   . Diabetes Neg Hx   . Drug abuse Neg Hx   . Early death Neg Hx   . Hearing loss Neg Hx   . Heart disease Neg Hx   . Hyperlipidemia Neg Hx   . Hypertension Neg Hx   . Kidney disease Neg Hx   . Learning disabilities Neg Hx   . Mental illness Neg Hx   . Mental retardation Neg Hx   . Miscarriages / Stillbirths Neg Hx   . Stroke Neg Hx   . Vision loss Neg Hx    History  Substance Use Topics  . Smoking status: Never Smoker   . Smokeless tobacco: Not on file  . Alcohol Use: No   OB History   Grav Para Term Preterm Abortions TAB SAB Ect Mult Living   3 2 2       2      Review of Systems  Gastrointestinal: Negative for abdominal pain.   Genitourinary: Positive for vaginal discharge.  All other systems reviewed and are negative.    Allergies  Review of patient's allergies indicates no known allergies.  Home Medications   Current Outpatient Rx  Name  Route  Sig  Dispense  Refill  . acetaminophen (TYLENOL) 325 MG tablet   Oral   Take 325 mg by mouth every 6 (six) hours as needed.         . metroNIDAZOLE (FLAGYL) 500 MG tablet   Oral   Take 500 mg by mouth 2 (two) times daily. Started medication on 10-15-13          Triage Vitals: BP 101/50  Pulse 107  Temp(Src) 98.6 F (37 C) (Oral)  Resp 16  SpO2 98%  LMP 06/15/2013  Physical Exam  Nursing note and vitals reviewed. Constitutional: She is oriented to person, place, and time. She appears well-developed and well-nourished. No distress.  HENT:  Head: Normocephalic and atraumatic.  Eyes: Conjunctivae are normal.  Neck: Normal range of motion. Neck supple.  Pulmonary/Chest: Effort normal. No respiratory distress.  Abdominal: She exhibits no distension.  Musculoskeletal: Normal range of motion.  Neurological: She is alert and oriented to person, place, and time.  Skin: Skin is warm and dry.  Psychiatric: She has a normal mood and affect. Her behavior is normal.    ED Course  Procedures (including critical care time)  DIAGNOSTIC STUDIES: Oxygen Saturation is 98% on room air, normal by my interpretation.    COORDINATION OF CARE: 4:46 PM- Will treat the patient for chlamydia with Zithromax. Advised patient to notify her partner of her diagnosis and to abstain from intercourse for at least 1 week. Discussed treatment plan with patient at bedside and patient verbalized agreement.     Labs Review Labs Reviewed - No data to display Imaging Review No results found.  EKG Interpretation   None       MDM   1. Chlamydia infection     [facility]. Patient's cultures came back positive  for chlamydia and she was told to come to ER to get treatment. I did review her records and her cultures were positive for chlamydia infection. I this time patient denies any fever, abdominal pain. She states she continues to have vaginal discharge. She is currently taking Flagyl for BV. Patient states she's pregnant approximately 17 weeks. I did treat in emergency department with Zithromax 1 g by mouth. She will be discharged home with outpatient followup. I have instructed her to not have intercourse for a week and have her partner treated as well.    I personally performed the services described in this documentation, which was scribed in my presence. The recorded information has been reviewed and is accurate.     Lottie Mussel, PA-C 10/17/13 1733

## 2013-10-17 NOTE — ED Notes (Signed)
[facility] They called her today and told her to come back here for treatment of chlamydia.

## 2013-10-17 NOTE — ED Provider Notes (Signed)
Medical screening examination/treatment/procedure(s) were performed by non-physician practitioner and as supervising physician I was immediately available for consultation/collaboration.  EKG Interpretation   None         Gerhard Munch, MD 10/17/13 [phone]

## 2013-11-07 NOTE — L&D Delivery Note (Signed)
Delivery Note At 5:35 AM a viable female was delivered via Vaginal, Spontaneous Delivery (Presentation: ; Occiput Anterior).  APGAR: 8, 9; weight 7 lb 12.2 oz (3520 g).   Placenta status: Intact, Spontaneous.  Cord: 3 vessels with the following complications: None.  Cord pH: none  Anesthesia: Epidural  Episiotomy: None Lacerations: None Suture Repair: none Est. Blood Loss (mL): 350  Mom to postpartum.  Baby to Couplet care / Skin to Skin.  Rachael Oliver 03/15/2014, 7:16 AM

## 2013-12-02 ENCOUNTER — Inpatient Hospital Stay (HOSPITAL_COMMUNITY)
Admission: AD | Admit: 2013-12-02 | Discharge: 2013-12-02 | Disposition: A | Discharge status: Home / Self Care | Payer: Medicaid Other | Source: Ambulatory Visit | Attending: Obstetrics | Admitting: Obstetrics

## 2013-12-02 ENCOUNTER — Encounter (HOSPITAL_COMMUNITY): Payer: Self-pay | Admitting: *Deleted

## 2013-12-02 DIAGNOSIS — N909 Noninflammatory disorder of vulva and perineum, unspecified: Secondary | ICD-10-CM | POA: Insufficient documentation

## 2013-12-02 DIAGNOSIS — O2390 Unspecified genitourinary tract infection in pregnancy, unspecified trimester: Secondary | ICD-10-CM | POA: Insufficient documentation

## 2013-12-02 DIAGNOSIS — B373 Candidiasis of vulva and vagina: Secondary | ICD-10-CM

## 2013-12-02 DIAGNOSIS — B3731 Acute candidiasis of vulva and vagina: Secondary | ICD-10-CM

## 2013-12-02 LAB — URINE MICROSCOPIC-ADD ON

## 2013-12-02 LAB — WET PREP, GENITAL
Clue Cells Wet Prep HPF POC: NONE SEEN
Trich, Wet Prep: NONE SEEN

## 2013-12-02 LAB — URINALYSIS, ROUTINE W REFLEX MICROSCOPIC
Bilirubin Urine: NEGATIVE
Glucose, UA: NEGATIVE mg/dL
HGB URINE DIPSTICK: NEGATIVE
Ketones, ur: NEGATIVE mg/dL
NITRITE: NEGATIVE
Protein, ur: NEGATIVE mg/dL
Specific Gravity, Urine: 1.02 (ref 1.005–1.030)
UROBILINOGEN UA: 0.2 mg/dL (ref 0.0–1.0)
pH: 7.5 (ref 5.0–8.0)

## 2013-12-02 MED ORDER — TERCONAZOLE 0.4 % VA CREA: 1.0000 | TOPICAL_CREAM | Freq: Every day | VAGINAL | Status: DC

## 2013-12-02 MED ORDER — FLUCONAZOLE 150 MG PO TABS: 150.0000 mg | ORAL_TABLET | Freq: Every day | ORAL | Status: DC

## 2013-12-02 NOTE — Discharge Instructions (Signed)
Candidal Vulvovaginitis  Candidal vulvovaginitis is an infection of the vagina and vulva. The vulva is the skin around the opening of the vagina. This may cause itching and discomfort in and around the vagina.   HOME CARE  · Only take medicine as told by your doctor.  · Do not have sex (intercourse) until the infection is healed or as told by your doctor.  · Practice safe sex.  · Tell your sex partner about your infection.  · Do not douche or use tampons.  · Wear cotton underwear. Do not wear tight pants or panty hose.  · Eat yogurt. This may help treat and prevent yeast infections.  GET HELP RIGHT AWAY IF:   · You have a fever.  · Your problems get worse during treatment or do not get better in 3 days.  · You have discomfort, irritation, or itching in your vagina or vulva area.  · You have pain after sex.  · You start to get belly (abdominal) pain.  MAKE SURE YOU:  · Understand these instructions.  · Will watch your condition.  · Will get help right away if you are not doing well or get worse.  Document Released: 01/20/2009 Document Revised: 01/16/2012 Document Reviewed: 01/20/2009  [employer]® Patient Information ©2014 [employer].

## 2013-12-02 NOTE — MAU Note (Signed)
Patient states she has had increasing swelling in the labia and vagina about 3-4 days ago that started causing vaginal pressure last night. Denies bleeding or discharge. Reports feeilng fetal movement.  Denies S/S of the flu.

## 2013-12-02 NOTE — MAU Provider Note (Signed)
History     CSN: [id number]  Arrival date and time: 12/02/13 1416   First Provider Initiated Contact with Patient 12/02/13 1526      No chief complaint on file.  HPI This is a 23 y.o. female at 3117w2d who presents with c/o vulvar irritation and swelling for a week. Got treated for Chlamydia last week. States Dr Gaynell FaceMarshall could not see her until next week.   RN Note:  Patient states she has had increasing swelling in the labia and vagina about 3-4 days ago that started causing vaginal pressure last night. Denies bleeding or discharge. Reports feeilng fetal movement.  Denies S/S of the flu.        OB History   Grav Para Term Preterm Abortions TAB SAB Ect Mult Living   3 2 2       2       Past Medical History  Diagnosis Date  . Asthma     Past Surgical History  Procedure Laterality Date  . Foot surgery      Family History  Problem Relation Age of Onset  . Alcohol abuse Neg Hx   . Arthritis Neg Hx   . Asthma Neg Hx   . Birth defects Neg Hx   . COPD Neg Hx   . Cancer Neg Hx   . Depression Neg Hx   . Diabetes Neg Hx   . Drug abuse Neg Hx   . Early death Neg Hx   . Hearing loss Neg Hx   . Heart disease Neg Hx   . Hyperlipidemia Neg Hx   . Hypertension Neg Hx   . Kidney disease Neg Hx   . Learning disabilities Neg Hx   . Mental illness Neg Hx   . Mental retardation Neg Hx   . Miscarriages / Stillbirths Neg Hx   . Stroke Neg Hx   . Vision loss Neg Hx     History  Substance Use Topics  . Smoking status: Never Smoker   . Smokeless tobacco: Not on file  . Alcohol Use: No    Allergies: No Known Allergies  Prescriptions prior to admission  Medication Sig Dispense Refill  . albuterol (PROVENTIL HFA;VENTOLIN HFA) 108 (90 BASE) MCG/ACT inhaler Inhale 2 puffs into the lungs every 6 (six) hours as needed for wheezing or shortness of breath.      . Prenatal Vit-Fe Fumarate-FA (PRENATAL MULTIVITAMIN) TABS tablet Take 1 tablet by mouth daily at 12 noon.         Review of Systems  Constitutional: Negative for fever and chills.  Gastrointestinal: Negative for nausea, vomiting and abdominal pain.  Skin: Positive for itching and rash (vulvar redness and itching).   Physical Exam   Blood pressure 114/62, pulse 81, temperature 98.8 F (37.1 C), temperature source Oral, resp. rate 16, height 5\' 6"  (1.676 m), weight 64.048 kg (141 lb 3.2 oz), last menstrual period 06/15/2013, SpO2 99.00%.  Physical Exam  Constitutional: She is oriented to person, place, and time. She appears well-developed and well-nourished. No distress.  HENT:  Head: Normocephalic.  Cardiovascular: Normal rate.   Respiratory: Effort normal.  GI: Soft.  Genitourinary: Uterus normal. Vaginal discharge (milky white leukorrhea) found.  Marked vulvar edema and erethema  Musculoskeletal: Normal range of motion.  Neurological: She is alert and oriented to person, place, and time.  Skin: Skin is warm and dry.  Psychiatric: She has a normal mood and affect.    MAU Course  Procedures  MDM Results for orders placed  during the hospital encounter of 12/02/13 (from the past 24 hour(s))  URINALYSIS, ROUTINE W REFLEX MICROSCOPIC     Status: Abnormal   Collection Time    12/02/13  2:30 PM      Result Value Range   Color, Urine YELLOW  YELLOW   APPearance HAZY (*) CLEAR   Specific Gravity, Urine 1.020  1.005 - 1.030   pH 7.5  5.0 - 8.0   Glucose, UA NEGATIVE  NEGATIVE mg/dL   Hgb urine dipstick NEGATIVE  NEGATIVE   Bilirubin Urine NEGATIVE  NEGATIVE   Ketones, ur NEGATIVE  NEGATIVE mg/dL   Protein, ur NEGATIVE  NEGATIVE mg/dL   Urobilinogen, UA 0.2  0.0 - 1.0 mg/dL   Nitrite NEGATIVE  NEGATIVE   Leukocytes, UA LARGE (*) NEGATIVE  URINE MICROSCOPIC-ADD ON     Status: Abnormal   Collection Time    12/02/13  2:30 PM      Result Value Range   Squamous Epithelial / LPF MANY (*) RARE   WBC, UA 7-10  <3 WBC/hpf   RBC / HPF 3-6  <3 RBC/hpf   Bacteria, UA MANY (*) RARE    Urine-Other MANY YEAST    WET PREP, GENITAL     Status: Abnormal   Collection Time    12/02/13  3:21 PM      Result Value Range   Yeast Wet Prep HPF POC MANY (*) NONE SEEN   Trich, Wet Prep NONE SEEN  NONE SEEN   Clue Cells Wet Prep HPF POC NONE SEEN  NONE SEEN   WBC, Wet Prep HPF POC MANY (*) NONE SEEN    Assessment and Plan  A:  SIUP at [redacted]w[redacted]d        Yeast vulvovaginitis  P:  Discussed findings      Rx Terazol for external use and Diflucan       Keep appt with Dr Gaynell Face for next week   [facility] 12/02/2013, 3:36 PM

## 2013-12-03 LAB — URINE CULTURE

## 2014-01-16 LAB — OB RESULTS CONSOLE ABO/RH: RH Type: POSITIVE

## 2014-01-19 LAB — OB RESULTS CONSOLE RPR: RPR: NONREACTIVE

## 2014-01-19 LAB — OB RESULTS CONSOLE GC/CHLAMYDIA
Chlamydia: POSITIVE
Gonorrhea: NEGATIVE

## 2014-01-19 LAB — OB RESULTS CONSOLE RUBELLA ANTIBODY, IGM: RUBELLA: IMMUNE

## 2014-01-19 LAB — OB RESULTS CONSOLE HEPATITIS B SURFACE ANTIGEN: Hepatitis B Surface Ag: NEGATIVE

## 2014-01-19 LAB — OB RESULTS CONSOLE HIV ANTIBODY (ROUTINE TESTING): HIV: NONREACTIVE

## 2014-01-23 ENCOUNTER — Inpatient Hospital Stay (HOSPITAL_COMMUNITY): Payer: Medicaid Other

## 2014-01-23 ENCOUNTER — Inpatient Hospital Stay (HOSPITAL_COMMUNITY)
Admission: AD | Admit: 2014-01-23 | Discharge: 2014-01-25 | DRG: 778 | Disposition: A | Discharge status: Home / Self Care | Payer: Medicaid Other | Source: Ambulatory Visit | Attending: Obstetrics | Admitting: Obstetrics

## 2014-01-23 ENCOUNTER — Encounter (HOSPITAL_COMMUNITY): Payer: Self-pay | Admitting: Family

## 2014-01-23 DIAGNOSIS — S1093XA Contusion of unspecified part of neck, initial encounter: Secondary | ICD-10-CM

## 2014-01-23 DIAGNOSIS — IMO0002 Reserved for concepts with insufficient information to code with codable children: Secondary | ICD-10-CM | POA: Diagnosis present

## 2014-01-23 DIAGNOSIS — S0003XA Contusion of scalp, initial encounter: Secondary | ICD-10-CM | POA: Diagnosis present

## 2014-01-23 DIAGNOSIS — O9989 Other specified diseases and conditions complicating pregnancy, childbirth and the puerperium: Secondary | ICD-10-CM

## 2014-01-23 DIAGNOSIS — S0083XA Contusion of other part of head, initial encounter: Secondary | ICD-10-CM

## 2014-01-23 DIAGNOSIS — Y92009 Unspecified place in unspecified non-institutional (private) residence as the place of occurrence of the external cause: Secondary | ICD-10-CM

## 2014-01-23 DIAGNOSIS — O99891 Other specified diseases and conditions complicating pregnancy: Secondary | ICD-10-CM | POA: Diagnosis present

## 2014-01-23 DIAGNOSIS — O4700 False labor before 37 completed weeks of gestation, unspecified trimester: Principal | ICD-10-CM

## 2014-01-23 LAB — URINALYSIS, ROUTINE W REFLEX MICROSCOPIC
Bilirubin Urine: NEGATIVE
GLUCOSE, UA: NEGATIVE mg/dL
HGB URINE DIPSTICK: NEGATIVE
Ketones, ur: NEGATIVE mg/dL
Nitrite: NEGATIVE
PH: 6 (ref 5.0–8.0)
PROTEIN: 30 mg/dL — AB
Specific Gravity, Urine: 1.02 (ref 1.005–1.030)
Urobilinogen, UA: 1 mg/dL (ref 0.0–1.0)

## 2014-01-23 LAB — URINE MICROSCOPIC-ADD ON

## 2014-01-23 MED ORDER — ZOLPIDEM TARTRATE 5 MG PO TABS: 5.0000 mg | ORAL_TABLET | Freq: Every evening | ORAL | Status: DC | PRN

## 2014-01-23 MED ORDER — ACETAMINOPHEN 325 MG PO TABS
650.0000 mg | ORAL_TABLET | ORAL | Status: DC | PRN
  Administered 2014-01-24: 650 mg via ORAL
  Filled 2014-01-23: qty 2

## 2014-01-23 MED ORDER — TERBUTALINE SULFATE 1 MG/ML IJ SOLN
0.2500 mg | Freq: Once | INTRAMUSCULAR | Status: AC
Start: 2014-01-23 — End: 2014-01-23
  Administered 2014-01-23: 0.25 mg via SUBCUTANEOUS
  Filled 2014-01-23: qty 1

## 2014-01-23 MED ORDER — ALBUTEROL SULFATE (2.5 MG/3ML) 0.083% IN NEBU
2.5000 mg | INHALATION_SOLUTION | Freq: Four times a day (QID) | RESPIRATORY_TRACT | Status: DC | PRN
  Administered 2014-01-23: 2.5 mg via RESPIRATORY_TRACT
  Filled 2014-01-23: qty 3

## 2014-01-23 MED ORDER — ZOLPIDEM TARTRATE 5 MG PO TABS
5.0000 mg | ORAL_TABLET | Freq: Every evening | ORAL | Status: DC | PRN
  Administered 2014-01-24: 5 mg via ORAL
  Filled 2014-01-23 (×3): qty 1

## 2014-01-23 MED ORDER — MUPIROCIN CALCIUM 2 % EX CREA
TOPICAL_CREAM | Freq: Once | CUTANEOUS | Status: DC
  Filled 2014-01-23: qty 15

## 2014-01-23 MED ORDER — LACTATED RINGERS IV SOLN
INTRAVENOUS | Status: DC
  Administered 2014-01-23 – 2014-01-24 (×2): via INTRAVENOUS

## 2014-01-23 MED ORDER — ACETAMINOPHEN 325 MG PO TABS: 650.0000 mg | ORAL_TABLET | ORAL | Status: DC | PRN

## 2014-01-23 MED ORDER — TRAMADOL HCL 50 MG PO TABS: 50.0000 mg | ORAL_TABLET | Freq: Once | ORAL | Status: DC

## 2014-01-23 MED ORDER — BETAMETHASONE SOD PHOS & ACET 6 (3-3) MG/ML IJ SUSP
12.0000 mg | INTRAMUSCULAR | Status: AC
  Administered 2014-01-23 – 2014-01-24 (×2): 12 mg via INTRAMUSCULAR
  Filled 2014-01-23 (×2): qty 2

## 2014-01-23 MED ORDER — MAGNESIUM SULFATE 40 G IN LACTATED RINGERS - SIMPLE
2.0000 g/h | INTRAVENOUS | Status: DC
  Filled 2014-01-23: qty 500

## 2014-01-23 MED ORDER — PRENATAL MULTIVITAMIN CH
1.0000 | ORAL_TABLET | Freq: Every day | ORAL | Status: DC
  Administered 2014-01-23 – 2014-01-24 (×2): 1 via ORAL
  Filled 2014-01-23 (×2): qty 1

## 2014-01-23 MED ORDER — MAGNESIUM SULFATE BOLUS VIA INFUSION
4.0000 g | Freq: Once | INTRAVENOUS | Status: AC
  Administered 2014-01-23: 4 g via INTRAVENOUS
  Filled 2014-01-23: qty 500

## 2014-01-23 MED ORDER — LACTATED RINGERS IV SOLN: INTRAVENOUS | Status: DC

## 2014-01-23 MED ORDER — PRENATAL MULTIVITAMIN CH
1.0000 | ORAL_TABLET | Freq: Every day | ORAL | Status: DC
  Filled 2014-01-23: qty 1

## 2014-01-23 MED ORDER — CALCIUM CARBONATE ANTACID 500 MG PO CHEW: 2.0000 | CHEWABLE_TABLET | ORAL | Status: DC | PRN

## 2014-01-23 MED ORDER — DOCUSATE SODIUM 100 MG PO CAPS
100.0000 mg | ORAL_CAPSULE | Freq: Every day | ORAL | Status: DC
Start: 2014-01-23 — End: 2014-01-25
  Administered 2014-01-24: 100 mg via ORAL
  Filled 2014-01-23: qty 1

## 2014-01-23 MED ORDER — DOCUSATE SODIUM 100 MG PO CAPS
100.0000 mg | ORAL_CAPSULE | Freq: Every day | ORAL | Status: DC
  Filled 2014-01-23: qty 1

## 2014-01-23 MED ORDER — LACTATED RINGERS IV SOLN
INTRAVENOUS | Status: DC
  Administered 2014-01-23: 11:00:00 via INTRAVENOUS

## 2014-01-23 MED ORDER — MAGNESIUM SULFATE BOLUS VIA INFUSION
4.0000 g | Freq: Once | INTRAVENOUS | Status: DC
  Filled 2014-01-23: qty 500

## 2014-01-23 MED ORDER — CALCIUM CARBONATE ANTACID 500 MG PO CHEW
2.0000 | CHEWABLE_TABLET | ORAL | Status: DC | PRN
  Filled 2014-01-23: qty 2
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
## 2014-01-23 NOTE — Plan of Care (Signed)
Problem: Consults Goal: Birthing Suites Patient Information Press F2 to bring up selections list Outcome: Completed/Met Date Met:  01/23/14  Pt < [redacted] weeks EGA
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
## 2014-01-24 MED ORDER — SODIUM CHLORIDE 0.9 % IJ SOLN
3.0000 mL | Freq: Two times a day (BID) | INTRAMUSCULAR | Status: DC
  Administered 2014-01-24: 3 mL via INTRAVENOUS
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
## 2014-01-24 NOTE — Progress Notes (Signed)
Patient ID: Rachael PereyraKiarra S Oliver, female   DOB: [birth year], [age] y.o.   MRN: [id number] Vital signs normal Feels better Contractions stopped so the magnesium was discontinued this morning and she gets a second dose of betamethasone tonight and

## 2014-01-24 NOTE — Progress Notes (Signed)
UR completed 

## 2014-01-24 NOTE — Progress Notes (Signed)
Chaplain received referral from patient's RN. Patient was unavailable, in restroom. Will follow up at a later time.   Guy SandiferHillary D Pinecraftrusta, IowaChaplain [phone]

## 2014-01-25 DIAGNOSIS — IMO0002 Reserved for concepts with insufficient information to code with codable children: Secondary | ICD-10-CM | POA: Diagnosis present

## 2014-01-25 NOTE — Discharge Instructions (Signed)
Preterm Labor Information Preterm labor is when labor starts at less than 37 weeks of pregnancy. The normal length of a pregnancy is 39 to 41 weeks. CAUSES Often, there is no identifiable underlying cause as to why a woman goes into preterm labor. One of the most common known causes of preterm labor is infection. Infections of the uterus, cervix, vagina, amniotic sac, bladder, kidney, or even the lungs (pneumonia) can cause labor to start. Other suspected causes of preterm labor include:   Urogenital infections, such as yeast infections and bacterial vaginosis.   Uterine abnormalities (uterine shape, uterine septum, fibroids, or bleeding from the placenta).   A cervix that has been operated on (it may fail to stay closed).   Malformations in the fetus.   Multiple gestations (twins, triplets, and so on).   Breakage of the amniotic sac.  RISK FACTORS  Having a previous history of preterm labor.   Having premature rupture of membranes (PROM).   Having a placenta that covers the opening of the cervix (placenta previa).   Having a placenta that separates from the uterus (placental abruption).   Having a cervix that is too weak to hold the fetus in the uterus (incompetent cervix).   Having too much fluid in the amniotic sac (polyhydramnios).   Taking illegal drugs or smoking while pregnant.   Not gaining enough weight while pregnant.   Being younger than 18 and older than 21 years old.   Having a low socioeconomic status.   Being African American. SYMPTOMS Signs and symptoms of preterm labor include:   Menstrual-like cramps, abdominal pain, or back pain.  Uterine contractions that are regular, as frequent as six in an hour, regardless of their intensity (may be mild or painful).  Contractions that start on the top of the uterus and spread down to the lower abdomen and back.   A sense of increased pelvic pressure.   A watery or bloody mucus discharge that  comes from the vagina.  TREATMENT Depending on the length of the pregnancy and other circumstances, your health care provider may suggest bed rest. If necessary, there are medicines that can be given to stop contractions and to mature the fetal lungs. If labor happens before 34 weeks of pregnancy, a prolonged hospital stay may be recommended. Treatment depends on the condition of both you and the fetus.  WHAT SHOULD YOU DO IF YOU THINK YOU ARE IN PRETERM LABOR? Call your health care provider right away. You will need to go to the hospital to get checked immediately. HOW CAN YOU PREVENT PRETERM LABOR IN FUTURE PREGNANCIES? You should:   Stop smoking if you smoke.  Maintain healthy weight gain and avoid chemicals and drugs that are not necessary.  Be watchful for any type of infection.  Inform your health care provider if you have a known history of preterm labor. Document Released: 01/14/2004 Document Revised: 06/26/2013 Document Reviewed: 11/26/2012 Exit[employer] Patient Information 2014 [employer].    
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
## 2014-01-25 NOTE — Progress Notes (Signed)
SW notified of consult and requested to come see patient this morning.

## 2014-02-02 ENCOUNTER — Inpatient Hospital Stay (HOSPITAL_COMMUNITY)
Admission: AD | Admit: 2014-02-02 | Discharge: 2014-02-05 | DRG: 778 | Disposition: A | Discharge status: Home / Self Care | Payer: Medicaid Other | Source: Ambulatory Visit | Attending: Obstetrics | Admitting: Obstetrics

## 2014-02-02 ENCOUNTER — Encounter (HOSPITAL_COMMUNITY): Payer: Self-pay

## 2014-02-02 DIAGNOSIS — O4700 False labor before 37 completed weeks of gestation, unspecified trimester: Principal | ICD-10-CM | POA: Diagnosis present

## 2014-02-02 DIAGNOSIS — J45909 Unspecified asthma, uncomplicated: Secondary | ICD-10-CM | POA: Diagnosis present

## 2014-02-02 DIAGNOSIS — O9989 Other specified diseases and conditions complicating pregnancy, childbirth and the puerperium: Secondary | ICD-10-CM

## 2014-02-02 DIAGNOSIS — O99891 Other specified diseases and conditions complicating pregnancy: Secondary | ICD-10-CM | POA: Diagnosis present

## 2014-02-02 LAB — URINALYSIS, ROUTINE W REFLEX MICROSCOPIC
Bilirubin Urine: NEGATIVE
GLUCOSE, UA: NEGATIVE mg/dL
Hgb urine dipstick: NEGATIVE
Ketones, ur: NEGATIVE mg/dL
Nitrite: NEGATIVE
Protein, ur: NEGATIVE mg/dL
Specific Gravity, Urine: <1.005 — ABNORMAL LOW (ref 1.005–1.030)
UROBILINOGEN UA: 0.2 mg/dL (ref 0.0–1.0)
pH: 7.5 (ref 5.0–8.0)

## 2014-02-02 LAB — CBC
HCT: 28.5 % — ABNORMAL LOW (ref 36.0–46.0)
Hemoglobin: 9.1 g/dL — ABNORMAL LOW (ref 12.0–15.0)
MCH: 25 pg — ABNORMAL LOW (ref 26.0–34.0)
MCHC: 31.9 g/dL (ref 30.0–36.0)
MCV: 78.3 fL (ref 78.0–100.0)
PLATELETS: 263 10*3/uL (ref 150–400)
RBC: 3.64 MIL/uL — ABNORMAL LOW (ref 3.87–5.11)
RDW: 13 % (ref 11.5–15.5)
WBC: 10.9 10*3/uL — AB (ref 4.0–10.5)

## 2014-02-02 LAB — RAPID URINE DRUG SCREEN, HOSP PERFORMED
AMPHETAMINES: NOT DETECTED
Barbiturates: NOT DETECTED
Benzodiazepines: NOT DETECTED
Cocaine: NOT DETECTED
Opiates: NOT DETECTED
TETRAHYDROCANNABINOL: NOT DETECTED

## 2014-02-02 LAB — URINE MICROSCOPIC-ADD ON

## 2014-02-02 LAB — TYPE AND SCREEN
ABO/RH(D): B POS
Antibody Screen: NEGATIVE

## 2014-02-02 LAB — ABO/RH: ABO/RH(D): B POS

## 2014-02-02 MED ORDER — MORPHINE SULFATE 10 MG/ML IJ SOLN
10.0000 mg | Freq: Once | INTRAMUSCULAR | Status: DC
  Filled 2014-02-02: qty 1

## 2014-02-02 MED ORDER — MAGNESIUM SULFATE BOLUS VIA INFUSION
6.0000 g | Freq: Once | INTRAVENOUS | Status: AC
Start: 2014-02-02 — End: 2014-02-02
  Administered 2014-02-02: 6 g via INTRAVENOUS
  Filled 2014-02-02: qty 500

## 2014-02-02 MED ORDER — LACTATED RINGERS IV SOLN
Freq: Once | INTRAVENOUS | Status: AC
  Administered 2014-02-02: 20:00:00 via INTRAVENOUS

## 2014-02-02 MED ORDER — SODIUM CHLORIDE 0.9 % IV SOLN
2.0000 g | Freq: Four times a day (QID) | INTRAVENOUS | Status: DC
  Administered 2014-02-02 – 2014-02-04 (×7): 2 g via INTRAVENOUS
  Filled 2014-02-02 (×7): qty 2000

## 2014-02-02 MED ORDER — PRENATAL MULTIVITAMIN CH
1.0000 | ORAL_TABLET | Freq: Every day | ORAL | Status: DC
  Administered 2014-02-03 – 2014-02-04 (×2): 1 via ORAL
  Filled 2014-02-02 (×3): qty 1

## 2014-02-02 MED ORDER — MAGNESIUM SULFATE 40 G IN LACTATED RINGERS - SIMPLE
2.0000 g/h | INTRAVENOUS | Status: DC
  Administered 2014-02-03: 2 g/h via INTRAVENOUS
  Filled 2014-02-02 (×2): qty 500

## 2014-02-02 MED ORDER — CALCIUM CARBONATE ANTACID 500 MG PO CHEW
2.0000 | CHEWABLE_TABLET | ORAL | Status: DC | PRN
  Filled 2014-02-02: qty 2

## 2014-02-02 MED ORDER — PROMETHAZINE HCL 25 MG/ML IJ SOLN
25.0000 mg | Freq: Once | INTRAMUSCULAR | Status: DC
  Filled 2014-02-02: qty 1

## 2014-02-02 MED ORDER — AZITHROMYCIN 500 MG PO TABS
500.0000 mg | ORAL_TABLET | Freq: Every day | ORAL | Status: DC
  Administered 2014-02-02 – 2014-02-04 (×3): 500 mg via ORAL
  Filled 2014-02-02 (×4): qty 1

## 2014-02-02 MED ORDER — ACETAMINOPHEN 325 MG PO TABS
650.0000 mg | ORAL_TABLET | ORAL | Status: DC | PRN
  Administered 2014-02-04: 650 mg via ORAL
  Filled 2014-02-02: qty 2

## 2014-02-02 MED ORDER — DOCUSATE SODIUM 100 MG PO CAPS
100.0000 mg | ORAL_CAPSULE | Freq: Every day | ORAL | Status: DC
  Administered 2014-02-04: 100 mg via ORAL
  Filled 2014-02-02 (×5): qty 1

## 2014-02-02 MED ORDER — ZOLPIDEM TARTRATE 5 MG PO TABS
5.0000 mg | ORAL_TABLET | Freq: Every evening | ORAL | Status: DC | PRN
  Filled 2014-02-02: qty 1

## 2014-02-02 NOTE — MAU Note (Signed)
[facility] Will go to room [facility]

## 2014-02-02 NOTE — MAU Note (Signed)
Pt presents via EMS complaining of contractions every 2-3 minutes. Denies vaginal bleeding or discharge or leaking of fluid. States she was admitted for preterm labor last week
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
## 2014-02-02 NOTE — Plan of Care (Signed)
Problem: Consults Goal: Birthing Suites Patient Information Press F2 to bring up selections list Outcome: Completed/Met Date Met:  02/02/14  Pt < [redacted] weeks EGA

## 2014-02-03 LAB — RPR: RPR Ser Ql: NONREACTIVE

## 2014-02-03 MED ORDER — LACTATED RINGERS IV SOLN
INTRAVENOUS | Status: DC
  Administered 2014-02-03 – 2014-02-04 (×3): via INTRAVENOUS
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
## 2014-02-03 NOTE — Progress Notes (Signed)
Ur chart review completed.  

## 2014-02-04 MED ORDER — NIFEDIPINE 10 MG PO CAPS
10.0000 mg | ORAL_CAPSULE | Freq: Four times a day (QID) | ORAL | Status: DC
  Administered 2014-02-04 – 2014-02-05 (×5): 10 mg via ORAL
  Filled 2014-02-04 (×5): qty 1

## 2014-02-04 MED ORDER — SODIUM CHLORIDE 0.9 % IJ SOLN: 3.0000 mL | INTRAMUSCULAR | Status: DC | PRN

## 2014-02-04 MED ORDER — SODIUM CHLORIDE 0.9 % IJ SOLN
3.0000 mL | Freq: Two times a day (BID) | INTRAMUSCULAR | Status: DC
  Administered 2014-02-04: 3 mL via INTRAVENOUS

## 2014-02-04 NOTE — Progress Notes (Signed)
Pt awakened from sleep with cramps that she describes as a 9 while texting and starting to eat her breakfast.  Pt just emptied her bladder.  Toco applied
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
## 2014-02-04 NOTE — Progress Notes (Signed)
Patient ID: Rachael PereyraKiarra S Dugar, female   DOB: [birth year], 23 y.o.   MRN: [id number] Vital signs normal Patient says she's not contracting Magnesium and ampicillin was discontinued today and started on Procardia 10 mg by mouth every 6 hours

## 2014-02-05 NOTE — Discharge Instructions (Signed)
Discharge instructions   You can wash your hair  Shower  Eat what you want  Drink what you want  See me in 6 weeks  Your ankles are going to swell more in the next 2 weeks than when pregnant  No sex for 6 weeks   Electa Sterry A, MD 02/05/2014

## 2014-02-05 NOTE — Discharge Summary (Signed)
  Patient is [redacted] weeks pregnant on was admitted with premature contractions which stop magnesium sulfate her magnesium was discontinued yesterday she was started on Procardia 10 mg 6 hours she has not had any contractions she'll be discharged today to see me in one week she has received betamethasone x2 and also a course of antibiotics

## 2014-02-05 NOTE — Progress Notes (Signed)
Pt given discharge instructions and reviewed PTL s/s and when to return to MD or MAU.  Pt verlbalizes understanding.

## 2014-02-25 ENCOUNTER — Encounter (HOSPITAL_COMMUNITY): Payer: Self-pay | Admitting: *Deleted

## 2014-02-25 ENCOUNTER — Inpatient Hospital Stay (HOSPITAL_COMMUNITY)
Admission: AD | Admit: 2014-02-25 | Discharge: 2014-02-25 | Disposition: A | Discharge status: Home / Self Care | Payer: Medicaid Other | Source: Ambulatory Visit | Attending: Obstetrics | Admitting: Obstetrics

## 2014-02-25 DIAGNOSIS — O4700 False labor before 37 completed weeks of gestation, unspecified trimester: Secondary | ICD-10-CM | POA: Insufficient documentation

## 2014-02-25 DIAGNOSIS — IMO0002 Reserved for concepts with insufficient information to code with codable children: Secondary | ICD-10-CM

## 2014-02-25 LAB — OB RESULTS CONSOLE GC/CHLAMYDIA
CHLAMYDIA, DNA PROBE: NEGATIVE
Gonorrhea: NEGATIVE

## 2014-02-25 LAB — OB RESULTS CONSOLE GBS: GBS: POSITIVE

## 2014-02-25 NOTE — Progress Notes (Signed)
Pt instructed to return to MAU if her "water breaks" if she has bleeding like a period and if she feels baby is not moving like it was moving before, and if she starts feeling more pain.
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
## 2014-02-25 NOTE — MAU Note (Signed)
Pt states she has been having pain for about 1 1/2hours. Pt denies leakage of fluid.

## 2014-02-25 NOTE — MAU Note (Signed)
Patient states she is having contractions every 2 minutes. Was in the office today at 1400 and was not dilated. Denies bleeding or leaking. Reports good fetal movement.

## 2014-02-25 NOTE — Discharge Instructions (Signed)

## 2014-03-15 ENCOUNTER — Inpatient Hospital Stay (HOSPITAL_COMMUNITY): Payer: Medicaid Other | Admitting: Anesthesiology

## 2014-03-15 ENCOUNTER — Encounter (HOSPITAL_COMMUNITY): Payer: Medicaid Other | Admitting: Anesthesiology

## 2014-03-15 ENCOUNTER — Inpatient Hospital Stay (HOSPITAL_COMMUNITY)
Admission: AD | Admit: 2014-03-15 | Discharge: 2014-03-17 | DRG: 775 | Disposition: A | Discharge status: Home / Self Care | Payer: Medicaid Other | Source: Ambulatory Visit | Attending: Obstetrics | Admitting: Obstetrics

## 2014-03-15 ENCOUNTER — Encounter (HOSPITAL_COMMUNITY): Payer: Self-pay | Admitting: *Deleted

## 2014-03-15 DIAGNOSIS — IMO0002 Reserved for concepts with insufficient information to code with codable children: Secondary | ICD-10-CM

## 2014-03-15 DIAGNOSIS — O9989 Other specified diseases and conditions complicating pregnancy, childbirth and the puerperium: Principal | ICD-10-CM

## 2014-03-15 DIAGNOSIS — O9903 Anemia complicating the puerperium: Secondary | ICD-10-CM | POA: Diagnosis not present

## 2014-03-15 DIAGNOSIS — O99892 Other specified diseases and conditions complicating childbirth: Principal | ICD-10-CM | POA: Diagnosis present

## 2014-03-15 DIAGNOSIS — D649 Anemia, unspecified: Secondary | ICD-10-CM | POA: Diagnosis present

## 2014-03-15 DIAGNOSIS — Z22330 Carrier of Group B streptococcus: Secondary | ICD-10-CM

## 2014-03-15 LAB — CBC
HCT: 27.9 % — ABNORMAL LOW (ref 36.0–46.0)
HEMOGLOBIN: 8.9 g/dL — AB (ref 12.0–15.0)
MCH: 23.9 pg — ABNORMAL LOW (ref 26.0–34.0)
MCHC: 31.9 g/dL (ref 30.0–36.0)
MCV: 74.8 fL — AB (ref 78.0–100.0)
Platelets: 266 10*3/uL (ref 150–400)
RBC: 3.73 MIL/uL — AB (ref 3.87–5.11)
RDW: 13.6 % (ref 11.5–15.5)
WBC: 8.9 10*3/uL (ref 4.0–10.5)

## 2014-03-15 LAB — RPR

## 2014-03-15 MED ORDER — DIBUCAINE 1 % RE OINT: 1.0000 "application " | TOPICAL_OINTMENT | RECTAL | Status: DC | PRN

## 2014-03-15 MED ORDER — LIDOCAINE HCL (PF) 1 % IJ SOLN
30.0000 mL | INTRAMUSCULAR | Status: AC | PRN
  Administered 2014-03-15: 5 mL via SUBCUTANEOUS
  Filled 2014-03-15: qty 30

## 2014-03-15 MED ORDER — SODIUM CHLORIDE 0.9 % IV SOLN
2.0000 g | Freq: Once | INTRAVENOUS | Status: AC
  Administered 2014-03-15: 2 g via INTRAVENOUS
  Filled 2014-03-15: qty 2000

## 2014-03-15 MED ORDER — FLEET ENEMA 7-19 GM/118ML RE ENEM: 1.0000 | ENEMA | RECTAL | Status: DC | PRN

## 2014-03-15 MED ORDER — OXYTOCIN 40 UNITS IN LACTATED RINGERS INFUSION - SIMPLE MED: 62.5000 mL/h | INTRAVENOUS | Status: DC | PRN

## 2014-03-15 MED ORDER — BENZOCAINE-MENTHOL 20-0.5 % EX AERO: 1.0000 "application " | INHALATION_SPRAY | CUTANEOUS | Status: DC | PRN

## 2014-03-15 MED ORDER — EPHEDRINE 5 MG/ML INJ
10.0000 mg | INTRAVENOUS | Status: DC | PRN
  Filled 2014-03-15: qty 4
  Filled 2014-03-15: qty 2

## 2014-03-15 MED ORDER — ONDANSETRON HCL 4 MG/2ML IJ SOLN
4.0000 mg | INTRAMUSCULAR | Status: DC | PRN
Start: 2014-03-15 — End: 2014-03-17

## 2014-03-15 MED ORDER — PHENYLEPHRINE 40 MCG/ML (10ML) SYRINGE FOR IV PUSH (FOR BLOOD PRESSURE SUPPORT)
80.0000 ug | PREFILLED_SYRINGE | INTRAVENOUS | Status: DC | PRN
  Filled 2014-03-15: qty 2
  Filled 2014-03-15: qty 10

## 2014-03-15 MED ORDER — SODIUM BICARBONATE 8.4 % IV SOLN
INTRAVENOUS | Status: DC | PRN
  Administered 2014-03-15: 5 mL via EPIDURAL

## 2014-03-15 MED ORDER — LACTATED RINGERS IV SOLN: 500.0000 mL | Freq: Once | INTRAVENOUS | Status: DC

## 2014-03-15 MED ORDER — WITCH HAZEL-GLYCERIN EX PADS: 1.0000 "application " | MEDICATED_PAD | CUTANEOUS | Status: DC | PRN

## 2014-03-15 MED ORDER — ACETAMINOPHEN 325 MG PO TABS: 650.0000 mg | ORAL_TABLET | ORAL | Status: DC | PRN

## 2014-03-15 MED ORDER — LACTATED RINGERS IV SOLN
500.0000 mL | INTRAVENOUS | Status: DC | PRN
  Administered 2014-03-15: 1000 mL via INTRAVENOUS

## 2014-03-15 MED ORDER — OXYCODONE-ACETAMINOPHEN 5-325 MG PO TABS
1.0000 | ORAL_TABLET | ORAL | Status: DC | PRN
Start: 2014-03-15 — End: 2014-03-17
  Administered 2014-03-15 – 2014-03-16 (×3): 1 via ORAL
  Filled 2014-03-15: qty 2
  Filled 2014-03-15: qty 1

## 2014-03-15 MED ORDER — OXYTOCIN BOLUS FROM INFUSION
500.0000 mL | INTRAVENOUS | Status: DC
  Administered 2014-03-15: 500 mL via INTRAVENOUS

## 2014-03-15 MED ORDER — OXYTOCIN 40 UNITS IN LACTATED RINGERS INFUSION - SIMPLE MED
62.5000 mL/h | INTRAVENOUS | Status: DC
  Administered 2014-03-15: 62.5 mL/h via INTRAVENOUS
  Filled 2014-03-15: qty 1000

## 2014-03-15 MED ORDER — PRENATAL MULTIVITAMIN CH
1.0000 | ORAL_TABLET | Freq: Every day | ORAL | Status: DC
  Administered 2014-03-15 – 2014-03-17 (×3): 1 via ORAL
  Filled 2014-03-15 (×3): qty 1

## 2014-03-15 MED ORDER — SENNOSIDES-DOCUSATE SODIUM 8.6-50 MG PO TABS
2.0000 | ORAL_TABLET | ORAL | Status: DC
  Administered 2014-03-15 – 2014-03-16 (×2): 2 via ORAL
  Filled 2014-03-15 (×2): qty 2

## 2014-03-15 MED ORDER — SIMETHICONE 80 MG PO CHEW
80.0000 mg | CHEWABLE_TABLET | ORAL | Status: DC | PRN
Start: 2014-03-15 — End: 2014-03-17

## 2014-03-15 MED ORDER — ZOLPIDEM TARTRATE 5 MG PO TABS: 5.0000 mg | ORAL_TABLET | Freq: Every evening | ORAL | Status: DC | PRN

## 2014-03-15 MED ORDER — DIPHENHYDRAMINE HCL 25 MG PO CAPS: 25.0000 mg | ORAL_CAPSULE | Freq: Four times a day (QID) | ORAL | Status: DC | PRN

## 2014-03-15 MED ORDER — OXYCODONE-ACETAMINOPHEN 5-325 MG PO TABS
1.0000 | ORAL_TABLET | ORAL | Status: DC | PRN
  Administered 2014-03-15: 1 via ORAL
  Filled 2014-03-15: qty 1

## 2014-03-15 MED ORDER — IBUPROFEN 600 MG PO TABS
600.0000 mg | ORAL_TABLET | Freq: Four times a day (QID) | ORAL | Status: DC
  Administered 2014-03-15 – 2014-03-17 (×9): 600 mg via ORAL
  Filled 2014-03-15 (×10): qty 1

## 2014-03-15 MED ORDER — LANOLIN HYDROUS EX OINT: TOPICAL_OINTMENT | CUTANEOUS | Status: DC | PRN

## 2014-03-15 MED ORDER — DIPHENHYDRAMINE HCL 50 MG/ML IJ SOLN
12.5000 mg | INTRAMUSCULAR | Status: DC | PRN
  Administered 2014-03-15: 12.5 mg via INTRAVENOUS
  Filled 2014-03-15: qty 1

## 2014-03-15 MED ORDER — PHENYLEPHRINE 40 MCG/ML (10ML) SYRINGE FOR IV PUSH (FOR BLOOD PRESSURE SUPPORT)
80.0000 ug | PREFILLED_SYRINGE | INTRAVENOUS | Status: DC | PRN
  Filled 2014-03-15: qty 2

## 2014-03-15 MED ORDER — MEDROXYPROGESTERONE ACETATE 150 MG/ML IM SUSP
150.0000 mg | INTRAMUSCULAR | Status: DC | PRN
  Filled 2014-03-15: qty 1

## 2014-03-15 MED ORDER — IBUPROFEN 600 MG PO TABS
600.0000 mg | ORAL_TABLET | Freq: Four times a day (QID) | ORAL | Status: DC | PRN
  Administered 2014-03-15: 600 mg via ORAL
  Filled 2014-03-15: qty 1

## 2014-03-15 MED ORDER — EPHEDRINE 5 MG/ML INJ
10.0000 mg | INTRAVENOUS | Status: DC | PRN
  Filled 2014-03-15: qty 2

## 2014-03-15 MED ORDER — ONDANSETRON HCL 4 MG PO TABS: 4.0000 mg | ORAL_TABLET | ORAL | Status: DC | PRN

## 2014-03-15 MED ORDER — FENTANYL 2.5 MCG/ML BUPIVACAINE 1/10 % EPIDURAL INFUSION (WH - ANES)
14.0000 mL/h | INTRAMUSCULAR | Status: DC | PRN
  Administered 2014-03-15: 14 mL/h via EPIDURAL
  Filled 2014-03-15: qty 125

## 2014-03-15 MED ORDER — ONDANSETRON HCL 4 MG/2ML IJ SOLN: 4.0000 mg | Freq: Four times a day (QID) | INTRAMUSCULAR | Status: DC | PRN

## 2014-03-15 MED ORDER — CITRIC ACID-SODIUM CITRATE 334-500 MG/5ML PO SOLN: 30.0000 mL | ORAL | Status: DC | PRN

## 2014-03-15 MED ORDER — LACTATED RINGERS IV SOLN: INTRAVENOUS | Status: DC

## 2014-03-15 MED ORDER — TETANUS-DIPHTH-ACELL PERTUSSIS 5-2.5-18.5 LF-MCG/0.5 IM SUSP
0.5000 mL | Freq: Once | INTRAMUSCULAR | Status: AC
  Administered 2014-03-16: 0.5 mL via INTRAMUSCULAR

## 2014-03-15 NOTE — Anesthesia Procedure Notes (Signed)
Epidural Patient location during procedure: OB Start time: 03/15/2014 5:13 AM End time: 03/15/2014 5:20 AM  Staffing Anesthesiologist: Jarreau Callanan, CHRIS Performed by: anesthesiologist   Preanesthetic Checklist Completed: patient identified, surgical consent, pre-op evaluation, timeout performed, IV checked, risks and benefits discussed and monitors and equipment checked  Epidural Patient position: sitting Prep: site prepped and draped and DuraPrep Patient monitoring: heart rate, cardiac monitor, continuous pulse ox and blood pressure Approach: midline Location: L3-L4 Injection technique: LOR saline  Needle:  Needle type: Tuohy  Needle gauge: 17 G Needle length: 9 cm Needle insertion depth: 5 cm Catheter type: closed end flexible Catheter size: 19 Gauge Catheter at skin depth: 12 cm Test dose: Other  Assessment Events: blood not aspirated, injection not painful, no injection resistance, negative IV test and no paresthesia  Additional Notes H+P and labs checked, risks and benefits discussed with the patient, consent obtained, procedure tolerated well and without complications.  Reason for block:procedure for pain

## 2014-03-15 NOTE — Progress Notes (Signed)
Vedia PereyraKiarra S Sokoloski is a 23 y.o. [id number] at 919w0d by LMP admitted for active labor  Subjective:   Objective: BP 110/65  Pulse 80  Temp(Src) 98.6 F (37 C) (Oral)  Resp 18  Ht 5\' 4"  (1.626 m)  Wt 154 lb (69.854 kg)  BMI 26.42 kg/m2  LMP 06/15/2013      FHT:  FHR: 150 bpm, variability: moderate,  accelerations:  Present,  decelerations:  Present variable UC:   regular, every 2-3 minutes SVE:   Dilation: 5 Effacement (%): 80 Station: -2 Exam by:: Avery DennisonBenji Stanley RN  Labs: Lab Results  Component Value Date   WBC 8.9 03/15/2014   HGB 8.9* 03/15/2014   HCT 27.9* 03/15/2014   MCV 74.8* 03/15/2014   PLT 266 03/15/2014    Assessment / Plan: Spontaneous labor, progressing normally  Labor: Progressing normally Preeclampsia:  n/a Fetal Wellbeing:  Category I Pain Control:  Epidural I/D:  n/a Anticipated MOD:  NSVD  Brock Badharles A Emmalena Canny 03/15/2014, 5:50 AM

## 2014-03-15 NOTE — H&P (Signed)
Rachael Oliver is a 23 y.o. female presenting for UC's. History OB History   Grav Para Term Preterm Abortions TAB SAB Ect Mult Living   3 2 1 1      2      Past Medical History  Diagnosis Date  . Asthma     as a child   Past Surgical History  Procedure Laterality Date  . Foot surgery     Family History: family history is negative for Alcohol abuse, Arthritis, Asthma, Birth defects, COPD, Cancer, Depression, Diabetes, Drug abuse, Early death, Hearing loss, Heart disease, Hyperlipidemia, Hypertension, Kidney disease, Learning disabilities, Mental illness, Mental retardation, Miscarriages / Stillbirths, Stroke, and Vision loss. Social History:  reports that she has never smoked. She has never used smokeless tobacco. She reports that she does not drink alcohol or use illicit drugs.   Prenatal Transfer Tool  Maternal Diabetes: No Genetic Screening: Normal Maternal Ultrasounds/Referrals: Normal Fetal Ultrasounds or other Referrals:  None Maternal Substance Abuse:  No Significant Maternal Medications:  None Significant Maternal Lab Results:  Lab values include: Group B Strep positive Other Comments:  None  ROS  Dilation: 5 Effacement (%): 80 Station: -2 Exam by:: Avery DennisonBenji Stanley RN Blood pressure 110/65, pulse 80, temperature 98.6 F (37 C), temperature source Oral, resp. rate 18, height 5\' 4"  (1.626 m), weight 154 lb (69.854 kg), last menstrual period 06/15/2013. Exam Physical Exam  Prenatal labs: ABO, Rh: --/--/B POS, B POS (03/29 1830) Antibody: NEG (03/29 1830) Rubella: Immune (03/15 0000) RPR: NON REACTIVE (03/29 1830)  HBsAg: Negative (03/15 0000)  HIV: Non-reactive (03/15 0000)  GBS: Positive (04/21 0000)   Assessment/Plan: 39 weeks.  Active labor.  Admit.   Brock Badharles A Harper 03/15/2014, 5:48 AM

## 2014-03-15 NOTE — Anesthesia Postprocedure Evaluation (Signed)
  Anesthesia Post-op Note  Patient: Rachael Oliver  Procedure(s) Performed: * No procedures listed *  Patient Location: Women's Unit  Anesthesia Type:Epidural  Level of Consciousness: awake and alert   Airway and Oxygen Therapy: Patient Spontanous Breathing  Post-op Pain: mild  Post-op Assessment: Post-op Vital signs reviewed, Patent Airway, No signs of Nausea or vomiting, Pain level controlled, No headache, No residual numbness and No residual motor weakness  Post-op Vital Signs: Reviewed and stable  Last Vitals:  Filed Vitals:   03/15/14 1130  BP: 116/73  Pulse: 73  Temp: 36.6 C  Resp: 18    Complications: No apparent anesthesia complications

## 2014-03-15 NOTE — MAU Note (Signed)
Contractions every min for last hour. Denies bleeding.  Baby moving well per pt.  Denies leaking. 2 cm at last visit

## 2014-03-15 NOTE — Anesthesia Preprocedure Evaluation (Signed)
Anesthesia Evaluation  Patient identified by MRN, date of birth, ID band Patient awake    Reviewed: Allergy & Precautions, H&P , NPO status , Patient's Chart, lab work & pertinent test results  History of Anesthesia Complications Negative for: history of anesthetic complications  Airway Mallampati: II TM Distance: >3 FB Neck ROM: Full    Dental  (+) Teeth Intact   Pulmonary asthma ,          Cardiovascular negative cardio ROS  Rhythm:Regular     Neuro/Psych negative neurological ROS     GI/Hepatic negative GI ROS, Neg liver ROS,   Endo/Other  negative endocrine ROS  Renal/GU negative Renal ROS     Musculoskeletal negative musculoskeletal ROS (+)   Abdominal   Peds  Hematology  (+) anemia ,   Anesthesia Other Findings   Reproductive/Obstetrics (+) Pregnancy                           Anesthesia Physical Anesthesia Plan  ASA: II  Anesthesia Plan: Epidural   Post-op Pain Management:    Induction:   Airway Management Planned:   Additional Equipment: None  Intra-op Plan:   Post-operative Plan:   Informed Consent: I have reviewed the patients History and Physical, chart, labs and discussed the procedure including the risks, benefits and alternatives for the proposed anesthesia with the patient or authorized representative who has indicated his/her understanding and acceptance.   Dental advisory given  Plan Discussed with: Anesthesiologist  Anesthesia Plan Comments:         Anesthesia Quick Evaluation

## 2014-03-16 LAB — CBC
HCT: 27.6 % — ABNORMAL LOW (ref 36.0–46.0)
Hemoglobin: 8.6 g/dL — ABNORMAL LOW (ref 12.0–15.0)
MCH: 23.4 pg — ABNORMAL LOW (ref 26.0–34.0)
MCHC: 31.2 g/dL (ref 30.0–36.0)
MCV: 75 fL — ABNORMAL LOW (ref 78.0–100.0)
PLATELETS: 272 10*3/uL (ref 150–400)
RBC: 3.68 MIL/uL — ABNORMAL LOW (ref 3.87–5.11)
RDW: 13.7 % (ref 11.5–15.5)
WBC: 9 10*3/uL (ref 4.0–10.5)

## 2014-03-16 NOTE — Progress Notes (Signed)
Clinical Social Work Department PSYCHOSOCIAL ASSESSMENT - MATERNAL/CHILD 03/16/2014  Patient:  Rachael Oliver,Rachael Oliver  Account Number:  [id number]  Admit Date:  03/15/2014  Childs Name:   [place] Christina-Rachael Oliver    Clinical Social Worker:  Tilly Pernice, LCSW   Date/Time:  03/16/2014 11:00 AM  Date Referred:  03/16/2014   Referral source  NICU     Referred reason  NICU  NICU   Other referral source:    I:  FAMILY / HOME ENVIRONMENT Child'Oliver legal guardian:  PARENT  Guardian - Name Guardian - Age Guardian - Address  Rachael Oliver,Rachael Oliver [address] [place], [place] [address]  Rachael Oliver, Rachael Oliver     Other household support members/support persons Other support:   Maternal grand parents    II  PSYCHOSOCIAL DATA Information Source:    Financial and Community Resources Employment:   Financial resources:  Medicaid If Medicaid - County:   Other  WIC  Food Stamps   School / Grade:   Maternity Care Coordinator / Child Services Coordination / Early Interventions:  Cultural issues impacting care:    III  STRENGTHS Strengths  Supportive family/friends  Home prepared for Child (including basic supplies)  Adequate Resources   Strength comment:    IV  RISK FACTORS AND CURRENT PROBLEMS Current Problem:       V  SOCIAL WORK ASSESSMENT Met with mother who was pleasant and receptive to social work intervention.  However, she was guarded.  She is a single parent with two other dependents ages 4 and 1.  She currently reside with maternal grandparents.  Both she and FOB are unemployed.  Informed that although he is currently unemployed, he provides financial support.   Mother denies any hx of mental illness or substance abuse.  Informed that she has spoken with the NICU team and the baby is doing well.  She reports no acute social concerns at this time.      VI SOCIAL WORK PLAN Social Work Plan  Psychosocial Support/Ongoing Assessment of Needs   Type of pt/family education:   If child  protective services report - county:   If child protective services report - date:   Information/referral to community resources comment:   Other social work plan:     

## 2014-03-16 NOTE — Addendum Note (Signed)
Addendum created 03/16/14 [id number] by Corky Soxhris Jrue Yambao, MD   Modules edited: Anesthesia Events, Anesthesia Medication Administration

## 2014-03-16 NOTE — Progress Notes (Signed)
Post Partum Day 1 Subjective: no complaints  Objective: Blood pressure 120/81, pulse 98, temperature 97.8 F (36.6 C), temperature source Oral, resp. rate 16, height 5\' 4"  (1.626 m), weight 154 lb (69.854 kg), last menstrual period 06/15/2013, SpO2 100.00%, unknown if currently breastfeeding.  Physical Exam:  General: alert and no distress Lochia: appropriate Uterine Fundus: firm Incision: none DVT Evaluation: No evidence of DVT seen on physical exam.   Recent Labs  03/15/14 0415  HGB 8.9*  HCT 27.9*    Assessment/Plan: Anemia.  Clinically stable.  Iron Rx. Plan for discharge tomorrow   LOS: 1 day   Brock BadCharles A Harper 03/16/2014, 5:40 AM

## 2014-03-17 NOTE — Progress Notes (Signed)
Pt is discharged in the care of friend with [facility].  Escort. Denies any pain or discomfort. Infant to remain in NICU.Discharge instructions with Rx were given to pt. Questions were asked and answered.. No excessive vaginal bleeding.

## 2014-03-17 NOTE — Lactation Note (Addendum)
This note was copied from the chart of Rachael Oliver Pund. Lactation Consultation Note      Follow up consult with this mom and term NICU baby, with diagnosis of hypoglycemia. The baby is not bottle feeding well, and is not tolerating gerber formula - spitting. Mom and baby are at 52 hours post partum now, and mom's milk is transitioning in. Shse expressed 20 mls this morning, a very good amount for 52 hours pp.  NNP Rosie FateSommer Souther spoke to mom, with me present, and made a plan for mom to breast feed on cue today, and [place] [facility] with [employer] tonight, and if one touches are stable, [place] with be discharged home with mom. I faxed WIC mom;s and baby's information, and [facility]. Hand expression taught to mom. She return demonstrated with good technique Her milk is transitioning in, and I will do pre and post weights with mom and baby today.  Patient Name: Rachael Oliver Alioto WUJWJ'XToday's Date: 03/17/2014 Reason for consult: Follow-up assessment;NICU baby   Maternal Data    Feeding    LATCH Score/Interventions                      Lactation Tools Discussed/Used Tools: Pump Breast pump type: Double-Electric Breast Pump WIC Program: Yes ([facility] Pump Review: Setup, frequency, and cleaning;Milk Storage;Other (comment) (part care, hand expresion, importance of pumping/breast feeding)   Consult Status Consult Status: Follow-up Date: 03/18/14 Follow-up type: In-patient    Alfred LevinsChristine Anne Jesscia Imm 03/17/2014, 9:47 AM

## 2014-03-17 NOTE — Discharge Instructions (Signed)
Discharge instructions   You can wash your hair  Shower  Eat what you want  Drink what you want  See me in 6 weeks  Your ankles are going to swell more in the next 2 weeks than when pregnant  No sex for 6 weeks   Rachael CosierBernard A Obed Samek, MD 03/17/2014

## 2014-03-17 NOTE — Discharge Summary (Signed)
Obstetric Discharge Summary Reason for Admission: onset of labor Prenatal Procedures: none Intrapartum Procedures: spontaneous vaginal delivery Postpartum Procedures: none Complications-Operative and Postpartum: none Hemoglobin  Date Value Ref Range Status  03/16/2014 8.6* 12.0 - 15.0 g/dL Final     HCT  Date Value Ref Range Status  03/16/2014 27.6* 36.0 - 46.0 % Final    Physical Exam:  General: alert Lochia: appropriate Uterine Fundus: firm Incision: healing well DVT Evaluation: No evidence of DVT seen on physical exam.  Discharge Diagnoses: Term Pregnancy-delivered  Discharge Information: Date: 03/17/2014 Activity: pelvic rest Diet: routine Medications: Percocet Condition: improved Instructions: refer to practice specific booklet Discharge to: home Follow-up Information   Follow up with Kathreen CosierMARSHALL,Katoria Yetman A, MD.   Specialty:  Obstetrics and Gynecology   Contact information:   [address] [place] [id number] [phone]       Newborn Data: Live born female  Birth Weight: 7 lb 12.2 oz (3520 g) APGAR: 8, 9  Home with mother.  Kathreen CosierBernard A Madalyn Legner 03/17/2014, 6:58 AM

## 2014-03-17 NOTE — Progress Notes (Signed)
Ur chart review completed.  

## 2014-03-19 ENCOUNTER — Ambulatory Visit: Payer: Self-pay

## 2014-03-19 NOTE — Lactation Note (Signed)
This note was copied from the chart of Rachael Oliver Engebretsen. Lactation Consultation NoteFollow up consuslt with this mom of a term NICU baby, while at her baby's bedside. Mom was pumping one breast at a time, due to the discomfort of engorgement. I got ice for her and applied it ot her breasts, and then reviewed with her the importance of breast care and pumping both breast at the  same time.  I showed the baby's nurse how to use th pre and post weight scale, so this information could be collected today on baby and mom. Mom knows to call for lactation help as needed. Mom is rooming in with her baby tonight  Patient Name: Rachael Oliver Frank [id number]'WToday's Date: 03/19/2014     Maternal Data    Feeding    LATCH Score/Interventions                      Lactation Tools Discussed/Used     Consult Status      Alfred LevinsChristine Anne Karis Rilling 03/19/2014, 11:18 AM

## 2014-08-11 ENCOUNTER — Inpatient Hospital Stay (HOSPITAL_COMMUNITY)
Admission: AD | Admit: 2014-08-11 | Discharge: 2014-08-11 | Disposition: A | Discharge status: Home / Self Care | Payer: Medicaid Other | Source: Ambulatory Visit | Attending: Obstetrics | Admitting: Obstetrics

## 2014-08-11 ENCOUNTER — Encounter (HOSPITAL_COMMUNITY): Payer: Self-pay | Admitting: *Deleted

## 2014-08-11 DIAGNOSIS — N390 Urinary tract infection, site not specified: Secondary | ICD-10-CM

## 2014-08-11 DIAGNOSIS — R300 Dysuria: Secondary | ICD-10-CM | POA: Diagnosis present

## 2014-08-11 LAB — URINALYSIS, ROUTINE W REFLEX MICROSCOPIC
BILIRUBIN URINE: NEGATIVE
Glucose, UA: NEGATIVE mg/dL
Ketones, ur: NEGATIVE mg/dL
NITRITE: NEGATIVE
PROTEIN: 100 mg/dL — AB
Specific Gravity, Urine: 1.015 (ref 1.005–1.030)
Urobilinogen, UA: 0.2 mg/dL (ref 0.0–1.0)
pH: 7 (ref 5.0–8.0)

## 2014-08-11 LAB — URINE MICROSCOPIC-ADD ON

## 2014-08-11 LAB — POCT PREGNANCY, URINE: PREG TEST UR: NEGATIVE

## 2014-08-11 MED ORDER — SULFAMETHOXAZOLE-TRIMETHOPRIM 800-160 MG PO TABS: 1.0000 | ORAL_TABLET | Freq: Two times a day (BID) | ORAL | Status: DC

## 2014-08-11 MED ORDER — SULFAMETHOXAZOLE-TRIMETHOPRIM 800-160 MG PO TABS: 1.0000 | ORAL_TABLET | Freq: Two times a day (BID) | ORAL | Status: AC

## 2014-08-11 NOTE — MAU Provider Note (Signed)
History     CSN: [id number]  Arrival date and time: 08/11/14 7829   First Provider Initiated Contact with Patient 08/11/14 1844      Chief Complaint  Patient presents with  . Dysuria  . Hematuria   HPI  [name] is a 23 y.o. female who presents with UTI symptoms; dysuria and hematuria. Symptoms started today. She denies fever or chills.  OB History   Grav Para Term Preterm Abortions TAB SAB Ect Mult Living   3 3 2 1      3       Past Medical History  Diagnosis Date  . Asthma     as a child    Past Surgical History  Procedure Laterality Date  . Foot surgery      Family History  Problem Relation Age of Onset  . Alcohol abuse Neg Hx   . Arthritis Neg Hx   . Asthma Neg Hx   . Birth defects Neg Hx   . COPD Neg Hx   . Cancer Neg Hx   . Depression Neg Hx   . Diabetes Neg Hx   . Drug abuse Neg Hx   . Early death Neg Hx   . Hearing loss Neg Hx   . Heart disease Neg Hx   . Hyperlipidemia Neg Hx   . Hypertension Neg Hx   . Kidney disease Neg Hx   . Learning disabilities Neg Hx   . Mental illness Neg Hx   . Mental retardation Neg Hx   . Miscarriages / Stillbirths Neg Hx   . Stroke Neg Hx   . Vision loss Neg Hx     History  Substance Use Topics  . Smoking status: Never Smoker   . Smokeless tobacco: Never Used  . Alcohol Use: No    Allergies: No Known Allergies  Prescriptions prior to admission  Medication Sig Dispense Refill  . Prenatal Vit-Fe Fumarate-FA (PRENATAL MULTIVITAMIN) TABS tablet Take 1 tablet by mouth daily at 12 noon.       Results for orders placed during the hospital encounter of 08/11/14 (from the past 48 hour(s))  URINALYSIS, ROUTINE W REFLEX MICROSCOPIC     Status: Abnormal   Collection Time    08/11/14  6:11 PM      Result Value Ref Range   Color, Urine AMBER (*) YELLOW   Comment: BIOCHEMICALS MAY BE AFFECTED BY COLOR   APPearance CLOUDY (*) CLEAR   Specific Gravity, Urine 1.015  1.005 - 1.030   pH 7.0  5.0 - 8.0   Glucose, UA NEGATIVE  NEGATIVE mg/dL   Hgb urine dipstick LARGE (*) NEGATIVE   Bilirubin Urine NEGATIVE  NEGATIVE   Ketones, ur NEGATIVE  NEGATIVE mg/dL   Protein, ur 562 (*) NEGATIVE mg/dL   Urobilinogen, UA 0.2  0.0 - 1.0 mg/dL   Nitrite NEGATIVE  NEGATIVE   Leukocytes, UA LARGE (*) NEGATIVE  URINE MICROSCOPIC-ADD ON     Status: Abnormal   Collection Time    08/11/14  6:11 PM      Result Value Ref Range   Squamous Epithelial / LPF FEW (*) RARE   WBC, UA 21-50  <3 WBC/hpf   RBC / HPF 21-50  <3 RBC/hpf  POCT PREGNANCY, URINE     Status: None   Collection Time    08/11/14  6:24 PM      Result Value Ref Range   Preg Test, Ur NEGATIVE  NEGATIVE   Comment:  THE SENSITIVITY OF THIS     METHODOLOGY IS >24 mIU/mL     Review of Systems  Constitutional: Negative for fever and chills.  Gastrointestinal: Negative for nausea, vomiting and abdominal pain.  Genitourinary: Positive for dysuria, urgency, frequency, hematuria and flank pain.       No vaginal discharge.     Musculoskeletal: Negative for back pain.   Physical Exam   Blood pressure 125/79, pulse 104, temperature 98.4 F (36.9 C), temperature source Oral, resp. rate 18, height 5\' 5"  (1.651 m), weight 56.7 kg (125 lb), SpO2 100.00%, unknown if currently breastfeeding.  Physical Exam  Constitutional: She is oriented to person, place, and time. She appears well-developed and well-nourished. No distress.  HENT:  Head: Normocephalic.  Eyes: Pupils are equal, round, and reactive to light.  Neck: Neck supple.  Respiratory: Effort normal.  GI: Normal appearance. There is no CVA tenderness.  Musculoskeletal: Normal range of motion.  Neurological: She is alert and oriented to person, place, and time.  Skin: Skin is warm. She is not diaphoretic.  Psychiatric: Her behavior is normal.    MAU Course  Procedures None  MDM UA Upt  Urine culture pending  Assessment and Plan   A: 1. UTI (lower urinary tract  infection)    P: Discharge home in stable condition RX: bactrim Return to MAU as needed, if symptoms worsen Pyelo precautions discussed  Follow up with Dr. Gaynell FaceMarshall as scheduled   Iona HansenJennifer Irene Javien Tesch, NP 08/11/2014 7:05 PM

## 2014-08-11 NOTE — MAU Note (Signed)
Patient presents to MAU with blood in urine and pain with urination that started today.

## 2014-08-12 LAB — URINE CULTURE: SPECIAL REQUESTS: NORMAL

## 2014-08-22 ENCOUNTER — Emergency Department (HOSPITAL_COMMUNITY)
Admission: EM | Admit: 2014-08-22 | Discharge: 2014-08-23 | Disposition: A | Discharge status: Home / Self Care | Payer: Medicaid Other | Attending: Emergency Medicine | Admitting: Emergency Medicine

## 2014-08-22 ENCOUNTER — Encounter (HOSPITAL_COMMUNITY): Payer: Self-pay | Admitting: Emergency Medicine

## 2014-08-22 DIAGNOSIS — Z79899 Other long term (current) drug therapy: Secondary | ICD-10-CM | POA: Diagnosis not present

## 2014-08-22 DIAGNOSIS — J45909 Unspecified asthma, uncomplicated: Secondary | ICD-10-CM | POA: Diagnosis not present

## 2014-08-22 DIAGNOSIS — M549 Dorsalgia, unspecified: Secondary | ICD-10-CM | POA: Diagnosis present

## 2014-08-22 DIAGNOSIS — Z3202 Encounter for pregnancy test, result negative: Secondary | ICD-10-CM | POA: Insufficient documentation

## 2014-08-22 DIAGNOSIS — R300 Dysuria: Secondary | ICD-10-CM | POA: Diagnosis not present

## 2014-08-22 DIAGNOSIS — Z87440 Personal history of urinary (tract) infections: Secondary | ICD-10-CM | POA: Insufficient documentation

## 2014-08-22 DIAGNOSIS — N739 Female pelvic inflammatory disease, unspecified: Secondary | ICD-10-CM | POA: Insufficient documentation

## 2014-08-22 DIAGNOSIS — R3915 Urgency of urination: Secondary | ICD-10-CM | POA: Insufficient documentation

## 2014-08-22 DIAGNOSIS — R350 Frequency of micturition: Secondary | ICD-10-CM | POA: Diagnosis not present

## 2014-08-22 DIAGNOSIS — N730 Acute parametritis and pelvic cellulitis: Secondary | ICD-10-CM

## 2014-08-22 LAB — CBC WITH DIFFERENTIAL/PLATELET
BASOS ABS: 0 10*3/uL (ref 0.0–0.1)
BASOS PCT: 0 % (ref 0–1)
EOS ABS: 0 10*3/uL (ref 0.0–0.7)
Eosinophils Relative: 0 % (ref 0–5)
HCT: 36.8 % (ref 36.0–46.0)
Hemoglobin: 11.9 g/dL — ABNORMAL LOW (ref 12.0–15.0)
Lymphocytes Relative: 24 % (ref 12–46)
Lymphs Abs: 1.6 10*3/uL (ref 0.7–4.0)
MCH: 25.3 pg — AB (ref 26.0–34.0)
MCHC: 32.3 g/dL (ref 30.0–36.0)
MCV: 78.3 fL (ref 78.0–100.0)
Monocytes Absolute: 0.6 10*3/uL (ref 0.1–1.0)
Monocytes Relative: 9 % (ref 3–12)
Neutro Abs: 4.6 10*3/uL (ref 1.7–7.7)
Neutrophils Relative %: 67 % (ref 43–77)
PLATELETS: 313 10*3/uL (ref 150–400)
RBC: 4.7 MIL/uL (ref 3.87–5.11)
RDW: 13.6 % (ref 11.5–15.5)
WBC: 6.9 10*3/uL (ref 4.0–10.5)

## 2014-08-22 LAB — URINALYSIS, ROUTINE W REFLEX MICROSCOPIC
Bilirubin Urine: NEGATIVE
Glucose, UA: NEGATIVE mg/dL
Ketones, ur: NEGATIVE mg/dL
Nitrite: NEGATIVE
PH: 5.5 (ref 5.0–8.0)
Protein, ur: NEGATIVE mg/dL
SPECIFIC GRAVITY, URINE: 1.013 (ref 1.005–1.030)
Urobilinogen, UA: 0.2 mg/dL (ref 0.0–1.0)

## 2014-08-22 LAB — WET PREP, GENITAL
Trich, Wet Prep: NONE SEEN
YEAST WET PREP: NONE SEEN

## 2014-08-22 LAB — COMPREHENSIVE METABOLIC PANEL WITH GFR
ALT: 15 U/L (ref 0–35)
AST: 20 U/L (ref 0–37)
Albumin: 4 g/dL (ref 3.5–5.2)
Alkaline Phosphatase: 95 U/L (ref 39–117)
Anion gap: 12 (ref 5–15)
BUN: 10 mg/dL (ref 6–23)
CO2: 25 meq/L (ref 19–32)
Calcium: 9.4 mg/dL (ref 8.4–10.5)
Chloride: 98 meq/L (ref 96–112)
Creatinine, Ser: 1.05 mg/dL (ref 0.50–1.10)
GFR calc Af Amer: 87 mL/min — ABNORMAL LOW
GFR calc non Af Amer: 75 mL/min — ABNORMAL LOW
Glucose, Bld: 86 mg/dL (ref 70–99)
Potassium: 3.5 meq/L — ABNORMAL LOW (ref 3.7–5.3)
Sodium: 135 meq/L — ABNORMAL LOW (ref 137–147)
Total Bilirubin: 0.4 mg/dL (ref 0.3–1.2)
Total Protein: 8.3 g/dL (ref 6.0–8.3)

## 2014-08-22 LAB — URINE MICROSCOPIC-ADD ON

## 2014-08-22 LAB — PREGNANCY, URINE: Preg Test, Ur: NEGATIVE

## 2014-08-22 MED ORDER — CEFTRIAXONE SODIUM 1 G IJ SOLR
1.0000 g | Freq: Once | INTRAMUSCULAR | Status: AC
  Administered 2014-08-22: 1 g via INTRAMUSCULAR
  Filled 2014-08-22: qty 10

## 2014-08-22 MED ORDER — OXYCODONE-ACETAMINOPHEN 5-325 MG PO TABS
1.0000 | ORAL_TABLET | Freq: Once | ORAL | Status: AC
  Administered 2014-08-22: 1 via ORAL
  Filled 2014-08-22: qty 1

## 2014-08-22 MED ORDER — DOXYCYCLINE HYCLATE 100 MG PO CAPS: 100.0000 mg | ORAL_CAPSULE | Freq: Two times a day (BID) | ORAL | Status: DC

## 2014-08-22 MED ORDER — TRAMADOL HCL 50 MG PO TABS: 50.0000 mg | ORAL_TABLET | Freq: Four times a day (QID) | ORAL | Status: DC | PRN

## 2014-08-22 MED ORDER — LIDOCAINE HCL 2 % IJ SOLN
3.0000 mL | Freq: Once | INTRAMUSCULAR | Status: AC
  Administered 2014-08-22: 60 mg
  Filled 2014-08-22: qty 20

## 2014-08-22 MED ORDER — DEXTROSE 5 % IV SOLN: 1.0000 g | Freq: Once | INTRAVENOUS | Status: DC

## 2014-08-22 MED ORDER — AZITHROMYCIN 250 MG PO TABS
1000.0000 mg | ORAL_TABLET | Freq: Once | ORAL | Status: AC
  Administered 2014-08-22: 1000 mg via ORAL
  Filled 2014-08-22: qty 4

## 2014-08-22 MED ORDER — POTASSIUM CHLORIDE CRYS ER 20 MEQ PO TBCR
40.0000 meq | EXTENDED_RELEASE_TABLET | Freq: Once | ORAL | Status: AC
  Administered 2014-08-22: 40 meq via ORAL
  Filled 2014-08-22: qty 2

## 2014-08-22 NOTE — ED Provider Notes (Signed)
CSN: [id number]     Arrival date & time 08/22/14  2021 History   First MD Initiated Contact with Patient 08/22/14 2146     Chief Complaint  Patient presents with  . Back Pain     (Consider location/radiation/quality/duration/timing/severity/associated sxs/prior Treatment) HPI  23 year old female with history of asthma presents complaining of back pain. 11 days ago patient developed some dysuria. She was seen by her primary care Dr. and was diagnosed with having urinary tract infection. She was placed on Bactrim which she takes for 7 days has finished a full course. She has been doing better but states for the past 3 days she reports having back pain which radiates to her low abdomen. Pain is described as a sharp and aching sensation, worsening with movement, laying down, taking a deep breath or cough. Pain is been persistent, 8/10. Reports occasional urinary frequency and urgency. Reports having chills. No fever, nausea, vomiting, diarrhea, chest pain, productive cough, hematuria, hematochezia or melena. No pain with sexual activity. Patient is a G3 P3.  Past Medical History  Diagnosis Date  . Asthma     as a child   Past Surgical History  Procedure Laterality Date  . Foot surgery     Family History  Problem Relation Age of Onset  . Alcohol abuse Neg Hx   . Arthritis Neg Hx   . Asthma Neg Hx   . Birth defects Neg Hx   . COPD Neg Hx   . Cancer Neg Hx   . Depression Neg Hx   . Diabetes Neg Hx   . Drug abuse Neg Hx   . Early death Neg Hx   . Hearing loss Neg Hx   . Heart disease Neg Hx   . Hyperlipidemia Neg Hx   . Hypertension Neg Hx   . Kidney disease Neg Hx   . Learning disabilities Neg Hx   . Mental illness Neg Hx   . Mental retardation Neg Hx   . Miscarriages / Stillbirths Neg Hx   . Stroke Neg Hx   . Vision loss Neg Hx    History  Substance Use Topics  . Smoking status: Never Smoker   . Smokeless tobacco: Never Used  . Alcohol Use: No   OB History   Grav Para  Term Preterm Abortions TAB SAB Ect Mult Living   3 3 2 1      3      Review of Systems  All other systems reviewed and are negative.     Allergies  Review of patient's allergies indicates no known allergies.  Home Medications   Prior to Admission medications   Medication Sig Start Date End Date Taking? Authorizing Provider  albuterol (PROVENTIL HFA;VENTOLIN HFA) 108 (90 BASE) MCG/ACT inhaler Inhale 2 puffs into the lungs every 6 (six) hours as needed for wheezing or shortness of breath.    Historical Provider, MD   BP 112/74  Pulse 106  Temp(Src) 98.4 F (36.9 C)  Resp 18  SpO2 100%  Breastfeeding? No Physical Exam  Nursing note and vitals reviewed. Constitutional: She is oriented to person, place, and time. She appears well-developed and well-nourished. No distress.  HENT:  Head: Normocephalic and atraumatic.  Eyes: Conjunctivae are normal.  Neck: Normal range of motion. Neck supple.  Cardiovascular: Normal rate and regular rhythm.   Pulmonary/Chest: Effort normal and breath sounds normal. She exhibits no tenderness.  Abdominal: Soft. There is tenderness (tenderness to left lower quadrant and left adnexal region on  palpation without guarding or rebound tenderness abdomen otherwise soft and nondistended).  Genitourinary: Uterus normal. There is no rash or lesion on the right labia. There is no rash or lesion on the left labia. Cervix exhibits motion tenderness and friability. Cervix exhibits no discharge. Right adnexum displays tenderness. Right adnexum displays no mass. Left adnexum displays tenderness. Left adnexum displays no mass. No erythema, tenderness or bleeding around the vagina. Vaginal discharge found.  Chaperone present:  Left CVA tenderness on percussion.   Lymphadenopathy:       Right: Inguinal adenopathy present.       Left: Inguinal adenopathy present.  Neurological: She is alert and oriented to person, place, and time.    ED Course  Procedures  (including critical care time)  Patient here with left flank pain and left low abdominal pain. UA is significant for urinary tract infection. Suspect possible pyelonephritis. She is tachycardic but otherwise afebrile with stable blood pressure and does not appear toxic. Low suspicion for kidney stones.  11:02 PM On pelvic examination, patient has moderate vaginal discharge, cervical os is closed but friable. Patient had bilateral adnexal tenderness with cervical motion tenderness concerning for PID. Patient has received a gram of Rocephin for UTI, will give 1 g of Zithromax by mouth and we'll discharge with doxycycline for 14 days. Return precaution discussed.  Pt currently not breast feeding.   Labs Review Labs Reviewed  WET PREP, GENITAL - Abnormal; Notable for the following:    Clue Cells Wet Prep HPF POC FEW (*)    WBC, Wet Prep HPF POC MANY (*)    All other components within normal limits  CBC WITH DIFFERENTIAL - Abnormal; Notable for the following:    Hemoglobin 11.9 (*)    MCH 25.3 (*)    All other components within normal limits  COMPREHENSIVE METABOLIC PANEL - Abnormal; Notable for the following:    Sodium 135 (*)    Potassium 3.5 (*)    GFR calc non Af Amer 75 (*)    GFR calc Af Amer 87 (*)    All other components within normal limits  URINALYSIS, ROUTINE W REFLEX MICROSCOPIC - Abnormal; Notable for the following:    APPearance TURBID (*)    Hgb urine dipstick SMALL (*)    Leukocytes, UA LARGE (*)    All other components within normal limits  URINE MICROSCOPIC-ADD ON - Abnormal; Notable for the following:    Squamous Epithelial / LPF MANY (*)    Bacteria, UA FEW (*)    All other components within normal limits  GC/CHLAMYDIA PROBE AMP  PREGNANCY, URINE    Imaging Review No results found.   EKG Interpretation None      MDM   Final diagnoses:  PID (acute pelvic inflammatory disease)    BP 102/59  Pulse 84  Temp(Src) 98.4 F (36.9 C)  Resp 16  SpO2 99%   Breastfeeding? No  I have reviewed nursing notes and vital signs. I personally reviewed the imaging tests through PACS system  I reviewed available ER/hospitalization records thought the EMR     Fayrene HelperBowie Garnett Rekowski, [place]-C 08/22/14 2341

## 2014-08-22 NOTE — Discharge Instructions (Signed)
Pelvic Inflammatory Disease °Pelvic inflammatory disease (PID) refers to an infection in some or all of the female organs. The infection can be in the uterus, ovaries, fallopian tubes, or the surrounding tissues in the pelvis. PID can cause abdominal or pelvic pain that comes on suddenly (acute pelvic pain). PID is a serious infection because it can lead to lasting (chronic) pelvic pain or the inability to have children (infertile).  °CAUSES  °The infection is often caused by the normal bacteria found in the vaginal tissues. PID may also be caused by an infection that is spread during sexual contact. PID can also occur following:  °· The birth of a baby.   °· A miscarriage.   °· An abortion.   °· Major pelvic surgery.   °· The use of an intrauterine device (IUD).   °· A sexual assault.   °RISK FACTORS °Certain factors can put a person at higher risk for PID, such as: °· Being younger than 25 years. °· Being sexually active at a young age. °· Using nonbarrier contraception. °· Having multiple sexual partners. °· Having sex with someone who has symptoms of a genital infection. °· Using oral contraception. °Other times, certain behaviors can increase the possibility of getting PID, such as: °· Having sex during your period. °· Using a vaginal douche. °· Having an intrauterine device (IUD) in place. °SYMPTOMS  °· Abdominal or pelvic pain.   °· Fever.   °· Chills.   °· Abnormal vaginal discharge. °· Abnormal uterine bleeding.   °· Unusual pain shortly after finishing your period. °DIAGNOSIS  °Your caregiver will choose some of the following methods to make a diagnosis, such as:  °· Performing a physical exam and history. A pelvic exam typically reveals a very tender uterus and surrounding pelvis.   °· Ordering laboratory tests including a pregnancy test, blood tests, and urine test.  °· Ordering cultures of the vagina and cervix to check for a sexually transmitted infection (STI). °· Performing an ultrasound.    °· Performing a laparoscopic procedure to look inside the pelvis.   °TREATMENT  °· Antibiotic medicines may be prescribed and taken by mouth.   °· Sexual partners may be treated when the infection is caused by a sexually transmitted disease (STD).   °· Hospitalization may be needed to give antibiotics intravenously. °· Surgery may be needed, but this is rare. °It may take weeks until you are completely well. If you are diagnosed with PID, you should also be checked for human immunodeficiency virus (HIV).   °HOME CARE INSTRUCTIONS  °· If given, take your antibiotics as directed. Finish the medicine even if you start to feel better.   °· Only take over-the-counter or prescription medicines for pain, discomfort, or fever as directed by your caregiver.   °· Do not have sexual intercourse until treatment is completed or as directed by your caregiver. If PID is confirmed, your recent sexual partner(s) will need treatment.   °· Keep your follow-up appointments. °SEEK MEDICAL CARE IF:  °· You have increased or abnormal vaginal discharge.   °· You need prescription medicine for your pain.   °· You vomit.   °· You cannot take your medicines.   °· Your partner has an STD.   °SEEK IMMEDIATE MEDICAL CARE IF:  °· You have a fever.   °· You have increased abdominal or pelvic pain.   °· You have chills.   °· You have pain when you urinate.   °· You are not better after 72 hours following treatment.   °MAKE SURE YOU:  °· Understand these instructions. °· Will watch your condition. °· Will get help right away if you are not doing well or get worse. °  Document Released: 10/24/2005 Document Revised: 02/18/2013 Document Reviewed: 10/20/2011 °[employer]® Patient Information ©2015 [employer]. This information is not intended to replace advice given to you by your health care provider. Make sure you discuss any questions you have with your health care provider. ° °

## 2014-08-22 NOTE — ED Notes (Signed)
Pt. reports mid back pain onset this evening worse with movement and deep inspiration .

## 2014-08-23 NOTE — ED Provider Notes (Signed)
Medical screening examination/treatment/procedure(s) were performed by non-physician practitioner and as supervising physician I was immediately available for consultation/collaboration.  Megan Docherty, MD 08/23/14 0038 

## 2014-08-25 LAB — GC/CHLAMYDIA PROBE AMP
CT Probe RNA: NEGATIVE
GC Probe RNA: NEGATIVE

## 2014-09-08 ENCOUNTER — Encounter (HOSPITAL_COMMUNITY): Payer: Self-pay | Admitting: Emergency Medicine

## 2014-11-23 ENCOUNTER — Encounter (HOSPITAL_COMMUNITY): Payer: Self-pay | Admitting: Emergency Medicine

## 2014-11-23 ENCOUNTER — Observation Stay (HOSPITAL_COMMUNITY)
Admission: EM | Admit: 2014-11-23 | Discharge: 2014-11-24 | Disposition: A | Discharge status: Home / Self Care | Payer: Medicaid Other | Attending: Cardiovascular Disease | Admitting: Cardiovascular Disease

## 2014-11-23 ENCOUNTER — Emergency Department (HOSPITAL_COMMUNITY): Payer: Medicaid Other

## 2014-11-23 DIAGNOSIS — J45909 Unspecified asthma, uncomplicated: Secondary | ICD-10-CM | POA: Diagnosis not present

## 2014-11-23 DIAGNOSIS — R42 Dizziness and giddiness: Secondary | ICD-10-CM | POA: Insufficient documentation

## 2014-11-23 DIAGNOSIS — I4891 Unspecified atrial fibrillation: Secondary | ICD-10-CM

## 2014-11-23 DIAGNOSIS — R55 Syncope and collapse: Principal | ICD-10-CM

## 2014-11-23 LAB — CBC WITH DIFFERENTIAL/PLATELET
Basophils Absolute: 0 10*3/uL (ref 0.0–0.1)
Basophils Relative: 1 % (ref 0–1)
EOS PCT: 1 % (ref 0–5)
Eosinophils Absolute: 0 10*3/uL (ref 0.0–0.7)
HCT: 41 % (ref 36.0–46.0)
HEMOGLOBIN: 13.3 g/dL (ref 12.0–15.0)
LYMPHS PCT: 42 % (ref 12–46)
Lymphs Abs: 1.7 10*3/uL (ref 0.7–4.0)
MCH: 25.8 pg — ABNORMAL LOW (ref 26.0–34.0)
MCHC: 32.4 g/dL (ref 30.0–36.0)
MCV: 79.5 fL (ref 78.0–100.0)
MONOS PCT: 7 % (ref 3–12)
Monocytes Absolute: 0.3 10*3/uL (ref 0.1–1.0)
NEUTROS ABS: 2.1 10*3/uL (ref 1.7–7.7)
Neutrophils Relative %: 49 % (ref 43–77)
PLATELETS: 241 10*3/uL (ref 150–400)
RBC: 5.16 MIL/uL — ABNORMAL HIGH (ref 3.87–5.11)
RDW: 13.2 % (ref 11.5–15.5)
WBC: 4.1 10*3/uL (ref 4.0–10.5)

## 2014-11-23 LAB — URINALYSIS, ROUTINE W REFLEX MICROSCOPIC
BILIRUBIN URINE: NEGATIVE
Glucose, UA: NEGATIVE mg/dL
HGB URINE DIPSTICK: NEGATIVE
KETONES UR: NEGATIVE mg/dL
Leukocytes, UA: NEGATIVE
NITRITE: NEGATIVE
PH: 6.5 (ref 5.0–8.0)
Protein, ur: NEGATIVE mg/dL
Specific Gravity, Urine: 1.023 (ref 1.005–1.030)
UROBILINOGEN UA: 1 mg/dL (ref 0.0–1.0)

## 2014-11-23 LAB — PREGNANCY, URINE: Preg Test, Ur: NEGATIVE

## 2014-11-23 MED ORDER — SODIUM CHLORIDE 0.9 % IV BOLUS (SEPSIS)
1000.0000 mL | Freq: Once | INTRAVENOUS | Status: AC
  Administered 2014-11-23: 1000 mL via INTRAVENOUS

## 2014-11-23 NOTE — ED Notes (Signed)
Patient transported to X-ray 

## 2014-11-23 NOTE — ED Notes (Addendum)
Pt was at work, began feeling dizzy and weak while she was up, walking around. Had episode of cold chills. States her employer sent her home and she had syncopal ep while walking home but states she did not "fall that hard" and "felt herself about to pass out" before it happened. states her head is hurting but denies hitting her head. EMS bp was 84 palpable while sitting then 120/80 after 250 ml bolus. Pt alert, oriented, nad

## 2014-11-23 NOTE — ED Provider Notes (Signed)
CSN: [id number]     Arrival date & time 11/23/14  2142 History   First MD Initiated Contact with Patient 11/23/14 2206     Chief Complaint  Patient presents with  . Dizziness     (Consider location/radiation/quality/duration/timing/severity/associated sxs/prior Treatment) HPI Comments: Patient is a 24 year old female past medical history significant for asthma presenting to the emergency department for evaluation of 2 syncopal episodes. Patient states she was at work when she felt dizzy, weak all over, had episodes of cold chills and sensation she is going to pass out. Denies any precipitating headache, shortness of breath, chest pain. She denies hitting her head. No modifying factors identified. Patient with continued dizziness and generalized weakness. No early familial cardiac history. She is on the Depakote shot, does not get withdrawal bleeding.  Patient is a 24 y.o. female presenting with dizziness.  Dizziness   Past Medical History  Diagnosis Date  . Asthma     as a child   Past Surgical History  Procedure Laterality Date  . Foot surgery     Family History  Problem Relation Age of Onset  . Alcohol abuse Neg Hx   . Arthritis Neg Hx   . Asthma Neg Hx   . Birth defects Neg Hx   . COPD Neg Hx   . Cancer Neg Hx   . Depression Neg Hx   . Diabetes Neg Hx   . Drug abuse Neg Hx   . Early death Neg Hx   . Hearing loss Neg Hx   . Heart disease Neg Hx   . Hyperlipidemia Neg Hx   . Hypertension Neg Hx   . Kidney disease Neg Hx   . Learning disabilities Neg Hx   . Mental illness Neg Hx   . Mental retardation Neg Hx   . Miscarriages / Stillbirths Neg Hx   . Stroke Neg Hx   . Vision loss Neg Hx    History  Substance Use Topics  . Smoking status: Never Smoker   . Smokeless tobacco: Never Used  . Alcohol Use: No   OB History    Gravida Para Term Preterm AB TAB SAB Ectopic Multiple Living   Review of Systems  Neurological: Positive for dizziness,  syncope, weakness and light-headedness.  All other systems reviewed and are negative.     Allergies  Review of patient's allergies indicates no known allergies.  Home Medications   Prior to Admission medications   Medication Sig Start Date End Date Taking? Authorizing Provider  albuterol (PROVENTIL HFA;VENTOLIN HFA) 108 (90 BASE) MCG/ACT inhaler Inhale 2 puffs into the lungs every 6 (six) hours as needed for wheezing or shortness of breath.    Historical Provider, MD  doxycycline (VIBRAMYCIN) 100 MG capsule Take 1 capsule (100 mg total) by mouth 2 (two) times daily. One po bid x 7 days 08/22/14   Fayrene Helper, PA-C  traMADol (ULTRAM) 50 MG tablet Take 1 tablet (50 mg total) by mouth every 6 (six) hours as needed. 08/22/14   Fayrene Helper, PA-C   BP 93/46 mmHg  Pulse 80  Temp(Src) 98.1 F (36.7 C) (Oral)  Resp 19  SpO2 97%  Breastfeeding? Yes Physical Exam  Constitutional: She is oriented to person, place, and time. She appears well-developed and well-nourished. No distress.  HENT:  Head: Normocephalic and atraumatic.  Right Ear: External ear normal.  Left Ear: External ear normal.  Nose: Nose normal.  Mouth/Throat: Oropharynx is clear and moist. No oropharyngeal exudate.  Eyes: Conjunctivae and EOM are normal. Pupils are equal, round, and reactive to light.  Neck: Normal range of motion. Neck supple.  Cardiovascular: Normal rate, regular rhythm, normal heart sounds and intact distal pulses.   Pulmonary/Chest: Effort normal and breath sounds normal. No respiratory distress.  Abdominal: Soft. There is no tenderness.  Musculoskeletal: Normal range of motion. She exhibits no edema.  Neurological: She is alert and oriented to person, place, and time. She has normal strength. No cranial nerve deficit. Gait normal. GCS eye subscore is 4. GCS verbal subscore is 5. GCS motor subscore is 6.  Sensation grossly intact.  No pronator drift.  Bilateral heel-knee-shin intact.  Skin: Skin is warm  and dry. She is not diaphoretic.  Nursing note and vitals reviewed.   ED Course  Procedures (including critical care time) Medications  heparin injection 5,000 Units (not administered)  sodium chloride 0.9 % injection 3 mL (3 mLs Intravenous Given 11/24/14 0328)  0.9 %  sodium chloride infusion ( Intravenous New Bag/Given 11/24/14 0327)  acetaminophen (TYLENOL) tablet 650 mg (not administered)    Or  acetaminophen (TYLENOL) suppository 650 mg (not administered)  sodium chloride 0.9 % bolus 1,000 mL (0 mLs Intravenous Stopped 11/24/14 0328)    Labs Review Labs Reviewed  BASIC METABOLIC PANEL - Abnormal; Notable for the following:    GFR calc non Af Amer 85 (*)    All other components within normal limits  CBC WITH DIFFERENTIAL - Abnormal; Notable for the following:    RBC 5.16 (*)    MCH 25.8 (*)    All other components within normal limits  PREGNANCY, URINE  URINALYSIS, ROUTINE W REFLEX MICROSCOPIC    Imaging Review Dg Chest 2 View  11/23/2014   CLINICAL DATA:  Shortness of breath, dizziness.  History of asthma.  EXAM: CHEST  2 VIEW  COMPARISON:  Chest radiograph December 01, 2010  FINDINGS: Cardiomediastinal silhouette is unremarkable. The lungs are clear without pleural effusions or focal consolidations. Trachea projects midline and there is no pneumothorax. Soft tissue planes and included osseous structures are non-suspicious. Bilateral nipple piercings.  IMPRESSION: Normal chest.   Electronically Signed   By: Awilda Metroourtnay  Bloomer   On: 11/23/2014 23:13     EKG Interpretation   Date/Time:  Sunday November 23 2014 22:35:44 EST Ventricular Rate:  76 PR Interval:    QRS Duration: 71 QT Interval:  397 QTC Calculation: 446 R Axis:   84 Text Interpretation:  Atrial fibrillation Borderline T wave abnormalities  no significant change since 2012 Confirmed by GOLDSTON  MD, SCOTT ([id number])  on 11/23/2014 11:28:20 PM      MDM   Final diagnoses:  Syncope and collapse  Atrial  fibrillation, unspecified    Filed Vitals:   11/24/14 0432  BP: 93/46  Pulse: 80  Temp:   Resp: 19     Afebrile, NAD, non-toxic appearing, AAOx4.  No neurofocal deficits on examination. I have reviewed nursing notes, vital signs, and all appropriate lab and imaging results for this patient. Patient with new onset A. fib, cardiology was consult and will admit the patient for observation overnight. Patient d/w with Dr. Criss AlvineGoldston, agrees with plan.    Jeannetta EllisJennifer L Bellina Tokarczyk, PA-C 11/24/14 0457  Audree CamelScott T Goldston, MD 11/27/14 [phone]

## 2014-11-24 DIAGNOSIS — R42 Dizziness and giddiness: Secondary | ICD-10-CM

## 2014-11-24 LAB — BASIC METABOLIC PANEL
Anion gap: 9 (ref 5–15)
BUN: 10 mg/dL (ref 6–23)
CHLORIDE: 106 meq/L (ref 96–112)
CO2: 23 mmol/L (ref 19–32)
CREATININE: 0.95 mg/dL (ref 0.50–1.10)
Calcium: 9.3 mg/dL (ref 8.4–10.5)
GFR, EST NON AFRICAN AMERICAN: 85 mL/min — AB (ref >90)
GLUCOSE: 82 mg/dL (ref 70–99)
Potassium: 3.6 mmol/L (ref 3.5–5.1)
SODIUM: 138 mmol/L (ref 135–145)

## 2014-11-24 MED ORDER — SODIUM CHLORIDE 0.9 % IV SOLN
INTRAVENOUS | Status: DC
Start: 2014-11-24 — End: 2014-11-24
  Administered 2014-11-24: 03:00:00 via INTRAVENOUS

## 2014-11-24 MED ORDER — SODIUM CHLORIDE 0.9 % IJ SOLN
3.0000 mL | Freq: Two times a day (BID) | INTRAMUSCULAR | Status: DC
Start: 2014-11-24 — End: 2014-11-24
  Administered 2014-11-24: 3 mL via INTRAVENOUS

## 2014-11-24 MED ORDER — ACETAMINOPHEN 325 MG PO TABS: 650.0000 mg | ORAL_TABLET | Freq: Four times a day (QID) | ORAL | Status: DC | PRN

## 2014-11-24 MED ORDER — HEPARIN SODIUM (PORCINE) 5000 UNIT/ML IJ SOLN
5000.0000 [IU] | Freq: Three times a day (TID) | INTRAMUSCULAR | Status: DC
  Filled 2014-11-24: qty 1

## 2014-11-24 MED ORDER — ACETAMINOPHEN 650 MG RE SUPP: 650.0000 mg | Freq: Four times a day (QID) | RECTAL | Status: DC | PRN

## 2014-11-24 NOTE — Progress Notes (Signed)
  Echocardiogram 2D Echocardiogram has been performed.  Rachael Oliver 11/24/2014, 8:59 AM

## 2014-11-24 NOTE — ED Notes (Signed)
Pt is in ECHO

## 2014-11-24 NOTE — ED Notes (Signed)
Pt lying in bed. Friend at bedside. No pain. Breakfast ordered, did not like. Offered snacks from department, did not want. Lunch tray ordered.

## 2014-11-24 NOTE — H&P (Signed)
Referring Physician:  [name] is an 24 y.o. female.                       Chief Complaint: Dizziness  HPI:  24 year old female with past medical history significant for asthma presenting to the emergency department for evaluation of 2 syncopal episodes. Patient states she was at work when she felt dizzy, weak all over, had episodes of cold chills and sensation she is going to pass out. Denies any precipitating headache, shortness of breath, chest pain. She denies hitting her head. No modifying factors identified. Patient with continued dizziness and generalized weakness. No early familial cardiac history. She is on the Depakote shot, does not get withdrawal bleeding. EKG shows junctional rhythm.  Past Medical History  Diagnosis Date  . Asthma     as a child      Past Surgical History  Procedure Laterality Date  . Foot surgery      Family History  Problem Relation Age of Onset  . Alcohol abuse Neg Hx   . Arthritis Neg Hx   . Asthma Neg Hx   . Birth defects Neg Hx   . COPD Neg Hx   . Cancer Neg Hx   . Depression Neg Hx   . Diabetes Neg Hx   . Drug abuse Neg Hx   . Early death Neg Hx   . Hearing loss Neg Hx   . Heart disease Neg Hx   . Hyperlipidemia Neg Hx   . Hypertension Neg Hx   . Kidney disease Neg Hx   . Learning disabilities Neg Hx   . Mental illness Neg Hx   . Mental retardation Neg Hx   . Miscarriages / Stillbirths Neg Hx   . Stroke Neg Hx   . Vision loss Neg Hx    Social History:  reports that she has never smoked. She has never used smokeless tobacco. She reports that she does not drink alcohol or use illicit drugs.  Allergies: No Known Allergies   (Not in a hospital admission)  Results for orders placed or performed during the hospital encounter of 11/23/14 (from the past 48 hour(s))  Pregnancy, urine     Status: None   Collection Time: 11/23/14 10:45 PM  Result Value Ref Range   Preg Test, Ur NEGATIVE NEGATIVE    Comment:        THE SENSITIVITY  OF THIS METHODOLOGY IS >20 mIU/mL.   Urinalysis, Routine w reflex microscopic     Status: None   Collection Time: 11/23/14 10:45 PM  Result Value Ref Range   Color, Urine YELLOW YELLOW   APPearance CLEAR CLEAR   Specific Gravity, Urine 1.023 1.005 - 1.030   pH 6.5 5.0 - 8.0   Glucose, UA NEGATIVE NEGATIVE mg/dL   Hgb urine dipstick NEGATIVE NEGATIVE   Bilirubin Urine NEGATIVE NEGATIVE   Ketones, ur NEGATIVE NEGATIVE mg/dL   Protein, ur NEGATIVE NEGATIVE mg/dL   Urobilinogen, UA 1.0 0.0 - 1.0 mg/dL   Nitrite NEGATIVE NEGATIVE   Leukocytes, UA NEGATIVE NEGATIVE    Comment: MICROSCOPIC NOT DONE ON URINES WITH NEGATIVE PROTEIN, BLOOD, LEUKOCYTES, NITRITE, OR GLUCOSE <1000 mg/dL.  Basic metabolic panel     Status: Abnormal   Collection Time: 11/23/14 11:27 PM  Result Value Ref Range   Sodium 138 135 - 145 mmol/L    Comment: Please note change in reference range.   Potassium 3.6 3.5 - 5.1 mmol/L    Comment: Please  note change in reference range.   Chloride 106 96 - 112 mEq/L   CO2 23 19 - 32 mmol/L   Glucose, Bld 82 70 - 99 mg/dL   BUN 10 6 - 23 mg/dL   Creatinine, Ser 0.95 0.50 - 1.10 mg/dL   Calcium 9.3 8.4 - 10.5 mg/dL   GFR calc non Af Amer 85 (L) >90 mL/min   GFR calc Af Amer >90 >90 mL/min    Comment: (NOTE) The eGFR has been calculated using the CKD EPI equation. This calculation has not been validated in all clinical situations. eGFR's persistently <90 mL/min signify possible Chronic Kidney Disease.    Anion gap 9 5 - 15  CBC with Differential     Status: Abnormal   Collection Time: 11/23/14 11:27 PM  Result Value Ref Range   WBC 4.1 4.0 - 10.5 K/uL   RBC 5.16 (H) 3.87 - 5.11 MIL/uL   Hemoglobin 13.3 12.0 - 15.0 g/dL   HCT 41.0 36.0 - 46.0 %   MCV 79.5 78.0 - 100.0 fL   MCH 25.8 (L) 26.0 - 34.0 pg   MCHC 32.4 30.0 - 36.0 g/dL   RDW 13.2 11.5 - 15.5 %   Platelets 241 150 - 400 K/uL   Neutrophils Relative % 49 43 - 77 %   Neutro Abs 2.1 1.7 - 7.7 K/uL    Lymphocytes Relative 42 12 - 46 %   Lymphs Abs 1.7 0.7 - 4.0 K/uL   Monocytes Relative 7 3 - 12 %   Monocytes Absolute 0.3 0.1 - 1.0 K/uL   Eosinophils Relative 1 0 - 5 %   Eosinophils Absolute 0.0 0.0 - 0.7 K/uL   Basophils Relative 1 0 - 1 %   Basophils Absolute 0.0 0.0 - 0.1 K/uL   Dg Chest 2 View  11/23/2014   CLINICAL DATA:  Shortness of breath, dizziness.  History of asthma.  EXAM: CHEST  2 VIEW  COMPARISON:  Chest radiograph December 01, 2010  FINDINGS: Cardiomediastinal silhouette is unremarkable. The lungs are clear without pleural effusions or focal consolidations. Trachea projects midline and there is no pneumothorax. Soft tissue planes and included osseous structures are non-suspicious. Bilateral nipple piercings.  IMPRESSION: Normal chest.   Electronically Signed   By: Elon Alas   On: 11/23/2014 23:13    Review Of Systems Neurological: Positive for dizziness, syncope, weakness and light-headedness.  All other systems reviewed and are negative.  Blood pressure 110/66, pulse 78, temperature 98.1 F (36.7 C), temperature source Oral, resp. rate 14, SpO2 99 %, currently breastfeeding. Physical Exam  Constitutional: She appears well-developed and well-nourished. No distress.  HENT: Head: Normocephalic and atraumatic. Right Ear: External ear normal. Left Ear: External ear normal. Nose: Nose normal. Oropharynx is clear and moist. No oropharyngeal exudate.  Eyes: Owens Shark, Conjunctivae and EOM are normal. Pupils are equal, round, and reactive to light.  Neck: Normal range of motion. Neck supple.  Cardiovascular: Normal rate, regular rhythm, normal heart sounds and intact distal pulses.  Pulmonary/Chest: Effort normal and breath sounds normal. No respiratory distress.  Abdominal: Soft. There is no tenderness.  Musculoskeletal: Normal range of motion. She exhibits no edema.  Neurological: She is alert and oriented to person, place, and time. She has normal strength. No cranial  nerve deficit. Gait normal. Sensation grossly intact. No pronator drift. Bilateral heel-knee-shin intact.  Skin: Skin is warm and dry. She is not diaphoretic.  Nursing note and vitals reviewed.  Assessment/Plan Dizziness Junctional rhythm Possible early dehydration  Place in observation, Monitor, IV fluids.  Birdie Riddle, MD  11/24/2014, 12:19 AM

## 2014-11-24 NOTE — ED Notes (Signed)
Notified MD of lowed BP, no new orders.

## 2014-11-24 NOTE — Discharge Summary (Signed)
Physician Discharge Summary  Patient ID: Rachael PereyraKiarra Oliver Oliver MRN: [id number] DOB/AGE: [birth year] 24 y.o.  Admit date: 11/23/2014 Discharge date: 11/24/2014  Admission Diagnoses: Dizziness  Discharge Diagnoses:  Principle Problem: * Dizziness * Possible early dehydration Junctional rhythm  Discharged Condition: good  Hospital Course: 24 year old female with past medical history significant for asthma presented to the emergency department for evaluation of 2 syncopal episodes. Patient stated she was at work when she felt dizzy, weak all over, had episodes of cold chills and sensation she is going to pass out. Denied any precipitating headache, shortness of breath, chest pain. She denied hitting her head. No modifying factors identified. No family history of cardiac disease. She is on Depo-provera shot and does not get withdrawal bleeding. Monitor showed junction rhythm with hear rate of 80'Oliver. Echocardiogram showed normal LV systolic function. She was advised to eat and drink extra fluids regularly as she has tendency to skip food and fluids on several occasions. She will be followed by me in 1 month and by primary care doctor as arranged.  Consults: cardiology  Significant Diagnostic Studies: labs: Unremarkable CBC and BMET. Negative urine pregnancy test.  Treatments: IV hydration  Discharge Exam: Blood pressure 95/55, pulse 78, temperature 98.1 F (36.7 C), temperature source Oral, resp. rate 25, SpO2 99 %, currently breastfeeding. Physical Exam  Constitutional: She appears well-developed and well-nourished. No distress.  HENT: Head: Normocephalic and atraumatic. Oropharynx is clear and moist.   Eyes: Brown, Conjunctivae and EOM are normal. Pupils are equal, round, and reactive to light.  Neck: Normal range of motion. Neck supple.  Cardiovascular: Normal rate, regular rhythm, normal heart sounds and intact distal pulses.  Pulmonary/Chest: Effort normal and breath sounds normal. No  respiratory distress.  Abdominal: Soft. There is no tenderness.  Musculoskeletal: Normal range of motion. She exhibits no edema.  Neurological: She is alert and oriented to person, place, and time. She has normal strength. No cranial nerve deficit. Gait normal. Sensation grossly intact.   Skin: Skin is warm and dry. She is not diaphoretic.  Nursing note and vitals reviewed  Disposition: 01-Home or Self Care     Medication List    STOP taking these medications        albuterol 108 (90 BASE) MCG/ACT inhaler  Commonly known as:  PROVENTIL HFA;VENTOLIN HFA     doxycycline 100 MG capsule  Commonly known as:  VIBRAMYCIN     traMADol 50 MG tablet  Commonly known as:  ULTRAM           Follow-up Information    Follow up with MARSHALL,BERNARD A, MD. Schedule an appointment as soon as possible for a visit in 1 month.   Specialty:  Obstetrics and Gynecology   Contact information:   [address] [place] [address] [phone]       [facility], MD. Schedule an appointment as soon as possible for a visit in 1 month.   Specialty:  Cardiology   Why:  re-check   Contact information:   [address] [place] [address] [phone]       Signed: Ricki RodriguezKADAKIA,Rachael Oliver 11/24/2014, 12:58 PM

## 2014-11-24 NOTE — ED Notes (Signed)
Meal tray provided for patient, that is heart healthy. Reports she doesn't like. Offered [place] sandwich and snacks, refused.

## 2014-12-03 ENCOUNTER — Emergency Department (HOSPITAL_COMMUNITY)
Admission: EM | Admit: 2014-12-03 | Discharge: 2014-12-03 | Disposition: A | Discharge status: Home / Self Care | Payer: Medicaid Other | Source: Home / Self Care | Attending: Family Medicine | Admitting: Family Medicine

## 2014-12-03 ENCOUNTER — Encounter (HOSPITAL_COMMUNITY): Payer: Self-pay | Admitting: Emergency Medicine

## 2014-12-03 DIAGNOSIS — T148 Other injury of unspecified body region: Secondary | ICD-10-CM

## 2014-12-03 DIAGNOSIS — W57XXXA Bitten or stung by nonvenomous insect and other nonvenomous arthropods, initial encounter: Secondary | ICD-10-CM

## 2014-12-03 MED ORDER — TRIAMCINOLONE ACETONIDE 0.5 % EX OINT: 1.0000 "application " | TOPICAL_OINTMENT | Freq: Two times a day (BID) | CUTANEOUS | Status: DC

## 2014-12-03 NOTE — ED Provider Notes (Signed)
Rachael Oliver is a 24 y.o. female who presents to Urgent Care today for rash. Patient has a pruritic rash on her trunk and lower extremities present for 3 days. No new soaps detergents cosmetics shampoos etc. No new medications. She recently spent the night at somebody else's house but denies any is friends with a similar rash. No one at her house has a similar rash. She has not tried any medications yet to treat her symptoms.   Past Medical History  Diagnosis Date  . Asthma     as a child   Past Surgical History  Procedure Laterality Date  . Foot surgery     History  Substance Use Topics  . Smoking status: Never Smoker   . Smokeless tobacco: Never Used  . Alcohol Use: No   ROS as above Medications: No current facility-administered medications for this encounter.   Current Outpatient Prescriptions  Medication Sig Dispense Refill  . triamcinolone ointment (KENALOG) 0.5 % Apply 1 application topically 2 (two) times daily. 60 g 1   No Known Allergies   Exam:  BP 114/61 mmHg  Pulse 70  Temp(Src) 98.1 F (36.7 C) (Oral)  Resp 16  SpO2 98%  Breastfeeding? No Gen: Well NAD HEENT: EOMI,  MMM Lungs: Normal work of breathing. CTABL Heart: RRR no MRG Abd: NABS, Soft. Nondistended, Nontender Exts: Brisk capillary refill, warm and well perfused.  Skin: Erythematous pruritic blanchable nontender maculopapular lesions on trunk and lower extremities  No results found for this or any previous visit (from the past 24 hour(s)). No results found.  Assessment and Plan: 24 y.o. female with probable bedbug bites. Treat with triamcinolone cream and search her house for bedbugs.  Discussed warning signs or symptoms. Please see discharge instructions. Patient expresses understanding.     Rodolph BongEvan S Corey, MD 12/03/14 [phone]

## 2014-12-03 NOTE — ED Notes (Signed)
C/o rash onset 3 days Rash on arms, legs, feet and back Denies fevers, chills Alert, no signs of acute distress.

## 2014-12-03 NOTE — Discharge Instructions (Signed)
Thank you for coming in today. Use triamcinolone ointment as needed.  Search your house for bedbugs Return or go to the emergency room if you get worse.   Bedbugs Bedbugs are tiny bugs that live in and around beds. During the day, they hide in mattresses and other places near beds. They come out at night and bite people lying in bed. They need blood to live and grow. Bedbugs can be found in beds anywhere. Usually, they are found in places where many people come and go (hotels, shelters, hospitals). It does not matter whether the place is dirty or clean. Getting bitten by bedbugs rarely causes a medical problem. The biggest problem can be getting rid of them. This often takes the work of a Oncologist. CAUSES  Less use of pesticides. Bedbugs were common before the 1950s. Then, strong pesticides such as DDT nearly wiped them out. Today, these pesticides are not used because they harm the environment and can cause health problems.  More travel. Besides mattresses, bedbugs can also live in clothing and luggage. They can come along as people travel from place to place. Bedbugs are more common in certain parts of the world. When people travel to those areas, the bugs can come home with them.  Presence of birds and bats. Bedbugs often infest birds and bats. If you have these animals in or near your home, bedbugs may infest your house, too. SYMPTOMS It does not hurt to be bitten by a bedbug. You will probably not wake up when you are bitten. Bedbugs usually bite areas of the skin that are not covered. Symptoms may show when you wake up, or they may take a day or more to show up. Symptoms may include:  Small red bumps on the skin. These might be lined up in a row or clustered in a group.  A darker red dot in the middle of red bumps.  Blisters on the skin. There may be swelling and very bad itching. These may be signs of an allergic reaction. This does not happen often. DIAGNOSIS Bedbug bites  might look and feel like other types of insect bites. The bugs do not stay on the body like ticks or lice. They bite, drop off, and crawl away to hide. Your caregiver will probably:  Ask about your symptoms.  Ask about your recent activities and travel.  Check your skin for bedbug bites.  Ask you to check at home for signs of bedbugs. You should look for:  Spots or stains on the bed or nearby. This could be from bedbugs that were crushed or from their eggs or waste.  Bedbugs themselves. They are reddish-brown, oval, and flat. They do not fly. They are about the size of an apple seed.  Places to look for bedbugs include:  Beds. Check mattresses, headboards, box springs, and bed frames.  On drapes and curtains near the bed.  Under carpeting in the bedroom.  Behind electrical outlets.  Behind any wallpaper that is peeling.  Inside luggage. TREATMENT Most bedbug bites do not need treatment. They usually go away on their own in a few days. The bites are not dangerous. However, treatment may be needed if you have scratched so much that your skin has become infected. You may also need treatment if you are allergic to bedbug bites. Treatment options include:  A drug that stops swelling and itching (corticosteroid). Usually, a cream is rubbed on the skin. If you have a bad rash, you may  be given a corticosteroid pill.  Oral antihistamines. These are pills to help control itching.  Antibiotic medicines. An antibiotic may be prescribed for infected skin. HOME CARE INSTRUCTIONS   Take any medicine prescribed by your caregiver for your bites. Follow the directions carefully.  Consider wearing pajamas with long sleeves and pant legs.  Your bedroom may need to be treated. A pest control expert should make sure the bedbugs are gone. You may need to throw away mattresses or luggage. Ask the pest control expert what you can do to keep the bedbugs from coming back. Common suggestions  include:  Putting a plastic cover over your mattress.  Washing and drying your clothes and bedding in hot water and a hot dryer. The temperature should be hotter than 120 F (48.9 C). Bedbugs are killed by high temperatures.  Vacuuming carefully all around your bed. Vacuum in all cracks and crevices where the bugs might hide. Do this often.  Carefully checking all used furniture, bedding, or clothes that you bring into your house.  Eliminating bird nests and bat roosts.  If you get bedbug bites when traveling, check all your possessions carefully before bringing them into your house. If you find any bugs on clothes or in your luggage, consider throwing those items away. SEEK MEDICAL CARE IF:  You have red bug bites that keep coming back.  You have red bug bites that itch badly.  You have bug bites that cause a skin rash.  You have scratch marks that are red and sore. SEEK IMMEDIATE MEDICAL CARE IF: You have a fever. Document Released: 11/26/2010 Document Revised: 01/16/2012 Document Reviewed: 11/26/2010 [facility] Patient Information 2015 [employer] [place]. This information is not intended to replace advice given to you by your health care provider. Make sure you discuss any questions you have with your health care provider.

## 2015-02-13 ENCOUNTER — Emergency Department (HOSPITAL_COMMUNITY)
Admission: EM | Admit: 2015-02-13 | Discharge: 2015-02-14 | Disposition: A | Discharge status: Home / Self Care | Payer: Medicaid Other | Attending: Emergency Medicine | Admitting: Emergency Medicine

## 2015-02-13 ENCOUNTER — Encounter (HOSPITAL_COMMUNITY): Payer: Self-pay | Admitting: Emergency Medicine

## 2015-02-13 DIAGNOSIS — Y9389 Activity, other specified: Secondary | ICD-10-CM | POA: Insufficient documentation

## 2015-02-13 DIAGNOSIS — Z792 Long term (current) use of antibiotics: Secondary | ICD-10-CM | POA: Insufficient documentation

## 2015-02-13 DIAGNOSIS — J45909 Unspecified asthma, uncomplicated: Secondary | ICD-10-CM | POA: Insufficient documentation

## 2015-02-13 DIAGNOSIS — S6992XA Unspecified injury of left wrist, hand and finger(s), initial encounter: Secondary | ICD-10-CM | POA: Diagnosis present

## 2015-02-13 DIAGNOSIS — Y9289 Other specified places as the place of occurrence of the external cause: Secondary | ICD-10-CM | POA: Diagnosis not present

## 2015-02-13 DIAGNOSIS — Z79899 Other long term (current) drug therapy: Secondary | ICD-10-CM | POA: Diagnosis not present

## 2015-02-13 DIAGNOSIS — S61412A Laceration without foreign body of left hand, initial encounter: Secondary | ICD-10-CM | POA: Diagnosis not present

## 2015-02-13 DIAGNOSIS — Y998 Other external cause status: Secondary | ICD-10-CM | POA: Insufficient documentation

## 2015-02-13 MED ORDER — LIDOCAINE-EPINEPHRINE (PF) 2 %-1:200000 IJ SOLN
INTRAMUSCULAR | Status: AC
  Administered 2015-02-13: 10 mL
  Filled 2015-02-13: qty 20

## 2015-02-13 MED ORDER — AMOXICILLIN-POT CLAVULANATE 875-125 MG PO TABS
1.0000 | ORAL_TABLET | Freq: Once | ORAL | Status: AC
  Administered 2015-02-13: 1 via ORAL
  Filled 2015-02-13: qty 1

## 2015-02-13 MED ORDER — IBUPROFEN 800 MG PO TABS
800.0000 mg | ORAL_TABLET | Freq: Once | ORAL | Status: AC
  Administered 2015-02-14: 800 mg via ORAL
  Filled 2015-02-13: qty 1

## 2015-02-13 MED ORDER — IBUPROFEN 800 MG PO TABS: 800.0000 mg | ORAL_TABLET | Freq: Three times a day (TID) | ORAL | Status: DC

## 2015-02-13 MED ORDER — AMOXICILLIN-POT CLAVULANATE 875-125 MG PO TABS: 1.0000 | ORAL_TABLET | Freq: Two times a day (BID) | ORAL | Status: DC

## 2015-02-13 MED ORDER — LIDOCAINE-EPINEPHRINE (PF) 2 %-1:200000 IJ SOLN
10.0000 mL | Freq: Once | INTRAMUSCULAR | Status: AC
  Administered 2015-02-13: 10 mL
  Filled 2015-02-13: qty 10

## 2015-02-13 NOTE — ED Notes (Signed)
Patient's hand is currently soaking
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
## 2015-02-13 NOTE — Discharge Instructions (Signed)
Human Bite Human bite wounds tend to become infected, even when they seem minor at first. Bite wounds of the hand can be serious because the tendons and joints are close to the skin. Infection can develop very rapidly, even in a matter of hours.  DIAGNOSIS  Your caregiver will most likely:  Take a detailed history of the bite injury.  Perform a wound exam.  Take your medical history. Blood tests or X-rays may be performed. Sometimes, infected bite wounds are cultured and sent to a lab to identify the infectious bacteria. TREATMENT  Medical treatment will depend on the location of the bite as well as the patient's medical history. Treatment may include:  Wound care, such as cleaning and flushing the wound with saline solution, bandaging, and elevating the affected area.  Antibiotic medicine.  Tetanus immunization.  Leaving the wound open to heal. This is often done with human bites due to the high risk of infection. However, in certain cases, wound closure with stitches, wound adhesive, skin adhesive strips, or staples may be used. Infected bites that are left untreated may require intravenous (IV) antibiotics and surgical treatment in the hospital. HOME CARE INSTRUCTIONS  Follow your caregiver's instructions for wound care.  Take all medicines as directed.  If your caregiver prescribes antibiotics, take them as directed. Finish them even if you start to feel better.  Follow up with your caregiver for further exams or immunizations as directed. You may need a tetanus shot if:  You cannot remember when you had your last tetanus shot.  You have never had a tetanus shot.  The injury broke your skin. If you get a tetanus shot, your arm may swell, get red, and feel warm to the touch. This is common and not a problem. If you need a tetanus shot and you choose not to have one, there is a rare chance of getting tetanus. Sickness from tetanus can be serious. SEEK IMMEDIATE MEDICAL CARE  IF:  You have increased pain, swelling, or redness around the bite wound.  You have chills.  You have a fever.  You have pus draining from the wound.  You have red streaks on the skin coming from the wound.  You have pain with movement or trouble moving the injured part.  You are not improving, or you are getting worse.  You have any other questions or concerns. MAKE SURE YOU:  Understand these instructions.  Will watch your condition.  Will get help right away if you are not doing well or get worse. Document Released: 12/01/2004 Document Revised: 01/16/2012 Document Reviewed: 06/15/2011 [facility] Patient Information 2015 [place], [place]. This information is not intended to replace advice given to you by your health care provider. Make sure you discuss any questions you have with your health care provider. Laceration Care, Adult A laceration is a cut or lesion that goes through all layers of the skin and into the tissue just beneath the skin. TREATMENT  Some lacerations may not require closure. Some lacerations may not be able to be closed due to an increased risk of infection. It is important to see your caregiver as soon as possible after an injury to minimize the risk of infection and maximize the opportunity for successful closure. If closure is appropriate, pain medicines may be given, if needed. The wound will be cleaned to help prevent infection. Your caregiver will use stitches (sutures), staples, wound glue (adhesive), or skin adhesive strips to repair the laceration. These tools bring the skin edges  together to allow for faster healing and a better cosmetic outcome. However, all wounds will heal with a scar. Once the wound has healed, scarring can be minimized by covering the wound with sunscreen during the day for 1 full year. HOME CARE INSTRUCTIONS  For sutures or staples:  Keep the wound clean and dry.  If you were given a bandage (dressing), you should change it at  least once a day. Also, change the dressing if it becomes wet or dirty, or as directed by your caregiver.  Wash the wound with soap and water 2 times a day. Rinse the wound off with water to remove all soap. Pat the wound dry with a clean towel.  After cleaning, apply a thin layer of the antibiotic ointment as recommended by your caregiver. This will help prevent infection and keep the dressing from sticking.  You may shower as usual after the first 24 hours. Do not soak the wound in water until the sutures are removed.  Only take over-the-counter or prescription medicines for pain, discomfort, or fever as directed by your caregiver.  Get your sutures or staples removed as directed by your caregiver. For skin adhesive strips:  Keep the wound clean and dry.  Do not get the skin adhesive strips wet. You may bathe carefully, using caution to keep the wound dry.  If the wound gets wet, pat it dry with a clean towel.  Skin adhesive strips will fall off on their own. You may trim the strips as the wound heals. Do not remove skin adhesive strips that are still stuck to the wound. They will fall off in time. For wound adhesive:  You may briefly wet your wound in the shower or bath. Do not soak or scrub the wound. Do not swim. Avoid periods of heavy perspiration until the skin adhesive has fallen off on its own. After showering or bathing, gently pat the wound dry with a clean towel.  Do not apply liquid medicine, cream medicine, or ointment medicine to your wound while the skin adhesive is in place. This may loosen the film before your wound is healed.  If a dressing is placed over the wound, be careful not to apply tape directly over the skin adhesive. This may cause the adhesive to be pulled off before the wound is healed.  Avoid prolonged exposure to sunlight or tanning lamps while the skin adhesive is in place. Exposure to ultraviolet light in the first year will darken the scar.  The skin  adhesive will usually remain in place for 5 to 10 days, then naturally fall off the skin. Do not pick at the adhesive film. You may need a tetanus shot if:  You cannot remember when you had your last tetanus shot.  You have never had a tetanus shot. If you get a tetanus shot, your arm may swell, get red, and feel warm to the touch. This is common and not a problem. If you need a tetanus shot and you choose not to have one, there is a rare chance of getting tetanus. Sickness from tetanus can be serious. SEEK MEDICAL CARE IF:   You have redness, swelling, or increasing pain in the wound.  You see a red line that goes away from the wound.  You have yellowish-white fluid (pus) coming from the wound.  You have a fever.  You notice a bad smell coming from the wound or dressing.  Your wound breaks open before or after sutures have been removed.  You notice something coming out of the wound such as wood or glass.  Your wound is on your hand or foot and you cannot move a finger or toe. SEEK IMMEDIATE MEDICAL CARE IF:   Your pain is not controlled with prescribed medicine.  You have severe swelling around the wound causing pain and numbness or a change in color in your arm, hand, leg, or foot.  Your wound splits open and starts bleeding.  You have worsening numbness, weakness, or loss of function of any joint around or beyond the wound.  You develop painful lumps near the wound or on the skin anywhere on your body. MAKE SURE YOU:   Understand these instructions.  Will watch your condition.  Will get help right away if you are not doing well or get worse. Document Released: 10/24/2005 Document Revised: 01/16/2012 Document Reviewed: 04/19/2011 [facility] Patient Information 2015 [place], [place]. This information is not intended to replace advice given to you by your health care provider. Make sure you discuss any questions you have with your health care provider.

## 2015-02-13 NOTE — ED Notes (Signed)
Bed: [id number] Expected date:  Expected time:  Means of arrival:  Comments: Laceration EMS
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
## 2015-02-14 MED ORDER — BACITRACIN ZINC 500 UNIT/GM EX OINT
1.0000 "application " | TOPICAL_OINTMENT | Freq: Once | CUTANEOUS | Status: AC
  Administered 2015-02-14: 1 via TOPICAL
  Filled 2015-02-14: qty 0.9

## 2015-04-29 ENCOUNTER — Encounter (HOSPITAL_COMMUNITY): Payer: Self-pay | Admitting: Emergency Medicine

## 2015-04-29 ENCOUNTER — Emergency Department (HOSPITAL_COMMUNITY): Payer: Medicaid Other

## 2015-04-29 ENCOUNTER — Emergency Department (HOSPITAL_COMMUNITY)
Admission: EM | Admit: 2015-04-29 | Discharge: 2015-04-29 | Disposition: A | Discharge status: Home / Self Care | Payer: Medicaid Other | Attending: Emergency Medicine | Admitting: Emergency Medicine

## 2015-04-29 DIAGNOSIS — J45909 Unspecified asthma, uncomplicated: Secondary | ICD-10-CM | POA: Diagnosis not present

## 2015-04-29 DIAGNOSIS — Z791 Long term (current) use of non-steroidal anti-inflammatories (NSAID): Secondary | ICD-10-CM | POA: Insufficient documentation

## 2015-04-29 DIAGNOSIS — R55 Syncope and collapse: Secondary | ICD-10-CM | POA: Insufficient documentation

## 2015-04-29 DIAGNOSIS — Z3202 Encounter for pregnancy test, result negative: Secondary | ICD-10-CM | POA: Insufficient documentation

## 2015-04-29 DIAGNOSIS — Z7952 Long term (current) use of systemic steroids: Secondary | ICD-10-CM | POA: Diagnosis not present

## 2015-04-29 DIAGNOSIS — Z792 Long term (current) use of antibiotics: Secondary | ICD-10-CM | POA: Diagnosis not present

## 2015-04-29 LAB — URINALYSIS, ROUTINE W REFLEX MICROSCOPIC
BILIRUBIN URINE: NEGATIVE
Glucose, UA: NEGATIVE mg/dL
Hgb urine dipstick: NEGATIVE
Ketones, ur: NEGATIVE mg/dL
Nitrite: NEGATIVE
PH: 6.5 (ref 5.0–8.0)
PROTEIN: NEGATIVE mg/dL
Specific Gravity, Urine: 1.017 (ref 1.005–1.030)
UROBILINOGEN UA: 1 mg/dL (ref 0.0–1.0)

## 2015-04-29 LAB — POC URINE PREG, ED: PREG TEST UR: NEGATIVE

## 2015-04-29 LAB — BASIC METABOLIC PANEL
Anion gap: 11 (ref 5–15)
BUN: 6 mg/dL (ref 6–20)
CO2: 19 mmol/L — ABNORMAL LOW (ref 22–32)
Calcium: 8.7 mg/dL — ABNORMAL LOW (ref 8.9–10.3)
Chloride: 111 mmol/L (ref 101–111)
Creatinine, Ser: 0.97 mg/dL (ref 0.44–1.00)
Glucose, Bld: 66 mg/dL (ref 65–99)
Potassium: 4 mmol/L (ref 3.5–5.1)
Sodium: 141 mmol/L (ref 135–145)

## 2015-04-29 LAB — URINE MICROSCOPIC-ADD ON

## 2015-04-29 LAB — CBC WITH DIFFERENTIAL/PLATELET
BASOS PCT: 1 % (ref 0–1)
Basophils Absolute: 0 10*3/uL (ref 0.0–0.1)
EOS ABS: 0 10*3/uL (ref 0.0–0.7)
EOS PCT: 1 % (ref 0–5)
HCT: 36 % (ref 36.0–46.0)
Hemoglobin: 11.7 g/dL — ABNORMAL LOW (ref 12.0–15.0)
Lymphocytes Relative: 37 % (ref 12–46)
Lymphs Abs: 1.3 10*3/uL (ref 0.7–4.0)
MCH: 26.2 pg (ref 26.0–34.0)
MCHC: 32.5 g/dL (ref 30.0–36.0)
MCV: 80.7 fL (ref 78.0–100.0)
Monocytes Absolute: 0.6 10*3/uL (ref 0.1–1.0)
Monocytes Relative: 17 % — ABNORMAL HIGH (ref 3–12)
NEUTROS PCT: 44 % (ref 43–77)
Neutro Abs: 1.6 10*3/uL — ABNORMAL LOW (ref 1.7–7.7)
Platelets: 188 10*3/uL (ref 150–400)
RBC: 4.46 MIL/uL (ref 3.87–5.11)
RDW: 13.4 % (ref 11.5–15.5)
WBC: 3.6 10*3/uL — AB (ref 4.0–10.5)

## 2015-04-29 LAB — TROPONIN I: Troponin I: <0.03 ng/mL (ref <0.031)

## 2015-04-29 MED ORDER — SODIUM CHLORIDE 0.9 % IV SOLN
1000.0000 mL | INTRAVENOUS | Status: DC
Start: 2015-04-29 — End: 2015-04-30
  Administered 2015-04-29: 1000 mL via INTRAVENOUS

## 2015-04-29 NOTE — ED Notes (Signed)
Pt unable to provide sample at this time.

## 2015-04-29 NOTE — ED Notes (Signed)
Patient transported to X-ray 

## 2015-04-29 NOTE — ED Notes (Signed)
PT unable to void at the moment. Laughing and talking on her cell phone.

## 2015-04-29 NOTE — ED Notes (Signed)
Pt ambulates to restroom with RN supervision, denies dizziness, states feels okay on her feet. Ambulates independently and with a steady gait.

## 2015-04-29 NOTE — ED Notes (Signed)
MD at bedside. 

## 2015-04-29 NOTE — ED Provider Notes (Signed)
CSN: [id number]     Arrival date & time 04/29/15  1829 History   First MD Initiated Contact with Patient 04/29/15 1834     Chief Complaint  Patient presents with  . Loss of Consciousness     (Consider location/radiation/quality/duration/timing/severity/associated sxs/prior Treatment) HPI Patient presents after 2 episodes of syncope versus near syncope. Patient acknowledges history of prior syncope, and cardiology follow-up with monitoring for 2 months, without clear diagnosis. However, she states that over the past months she has been well. Today the patient had 2 episodes of prodromal syncope, neither time with substantial fall, nor subsequent trauma. There was no chest pain either before or after either episode. Currently the patient has no complaints. No recent medication changes, diet changes. Patient did start a new job today, working in [employer], which she describes as being warm.  Past Medical History  Diagnosis Date  . Asthma     as a child   Past Surgical History  Procedure Laterality Date  . Foot surgery     Family History  Problem Relation Age of Onset  . Alcohol abuse Neg Hx   . Arthritis Neg Hx   . Asthma Neg Hx   . Birth defects Neg Hx   . COPD Neg Hx   . Cancer Neg Hx   . Depression Neg Hx   . Diabetes Neg Hx   . Drug abuse Neg Hx   . Early death Neg Hx   . Hearing loss Neg Hx   . Heart disease Neg Hx   . Hyperlipidemia Neg Hx   . Hypertension Neg Hx   . Kidney disease Neg Hx   . Learning disabilities Neg Hx   . Mental illness Neg Hx   . Mental retardation Neg Hx   . Miscarriages / Stillbirths Neg Hx   . Stroke Neg Hx   . Vision loss Neg Hx    History  Substance Use Topics  . Smoking status: Never Smoker   . Smokeless tobacco: Never Used  . Alcohol Use: No   OB History    Gravida Para Term Preterm AB TAB SAB Ectopic Multiple Living   Review of Systems  Constitutional:       Per HPI, otherwise negative  HENT:        Per HPI, otherwise negative  Respiratory:       Per HPI, otherwise negative  Cardiovascular:       Per HPI, otherwise negative  Gastrointestinal: Negative for vomiting.  Endocrine:       Negative aside from HPI  Genitourinary:       Neg aside from HPI   Musculoskeletal:       Per HPI, otherwise negative  Skin: Negative.   Neurological: Positive for syncope.      Allergies  Review of patient's allergies indicates no known allergies.  Home Medications   Prior to Admission medications   Medication Sig Start Date End Date Taking? Authorizing Provider  amoxicillin-clavulanate (AUGMENTIN) 875-125 MG per tablet Take 1 tablet by mouth 2 (two) times daily. One po bid x 7 days 02/13/15   Arby Barrette, MD  ibuprofen (ADVIL,MOTRIN) 800 MG tablet Take 1 tablet (800 mg total) by mouth 3 (three) times daily. 02/13/15   Arby Barrette, MD  triamcinolone ointment (KENALOG) 0.5 % Apply 1 application topically 2 (two) times daily. Patient not taking: Reported on 02/13/2015 12/03/14   Rodolph Bong, MD  BP 111/71 mmHg  Pulse 80  Temp(Src) 98.7 F (37.1 C) (Oral)  Resp 19  SpO2 100% Physical Exam  Constitutional: She is oriented to person, place, and time. She appears well-developed and well-nourished. No distress.  HENT:  Head: Normocephalic and atraumatic.  Eyes: Conjunctivae and EOM are normal.  Cardiovascular: Normal rate and regular rhythm.   Pulmonary/Chest: Effort normal and breath sounds normal. No stridor. No respiratory distress.  Abdominal: She exhibits no distension.  Musculoskeletal: She exhibits no edema.  Neurological: She is alert and oriented to person, place, and time. No cranial nerve deficit.  Skin: Skin is warm and dry.  Psychiatric: She has a normal mood and affect.  Nursing note and vitals reviewed.   ED Course  Procedures (including critical care time) Labs Review Labs Reviewed  CBC WITH DIFFERENTIAL/PLATELET  COMPREHENSIVE METABOLIC PANEL  TROPONIN I   URINALYSIS, ROUTINE W REFLEX MICROSCOPIC (NOT AT [facility])  POC URINE PREG, ED    Imaging Review Dg Chest 2 View  04/29/2015   CLINICAL DATA:  Patient with syncope.  EXAM: CHEST  2 VIEW  COMPARISON:  Chest radiograph 11/23/2014  FINDINGS: Stable cardiac and mediastinal contours. Lungs are clear. No pleural effusion or pneumothorax. Regional skeleton is unremarkable.  IMPRESSION: No acute cardiopulmonary process.   Electronically Signed   By: Annia Belt M.D.   On: 04/29/2015 20:03     EKG Interpretation   Date/Time:  Wednesday April 29 2015 18:30:21 EDT Ventricular Rate:  80 PR Interval:  204 QRS Duration: 84 QT Interval:  548 QTC Calculation: 632 R Axis:   85 Text Interpretation:  Sinus rhythm Atrial premature complex Borderline  prolonged PR interval Borderline T abnormalities, inferior leads Prolonged  QT interval Sinus rhythm T wave abnormality Abnormal ekg Confirmed by  Gerhard Munch  MD ([id number]) on 04/29/2015 6:54:37 PM     Pulse ox 100% room air normal Cardiac 80 sinus normal  9:34 PM  On repeat exam the patient is calm, speaking clearly, vital signs are stable. We discussed all findings at length, including reassuring labs, vital signs are We discussed the need follow-up with cardiology for additional cardiac monitoring, as well as with primary care.  MDM   Final diagnoses:  Syncope and collapse   well-appearing thin young female presents after an episode of syncope, and another episode of what sounds like near syncope. Patient has multiple prior similar episodes. Here the patient is hemodynamically stable, awake, alert. No evidence for ongoing arrhythmia, nor ischemia. Patient's labs, vital signs reassuring. With low risk profile for persistent dangerous arrhythmia, and with patient already having an relationship with cardiology she was appropriate for discharge with further evaluation, management to occur tomorrow as an outpatient.  Gerhard Munch, MD 04/29/15  [phone]

## 2015-04-29 NOTE — Discharge Instructions (Signed)
As discussed, your evaluation today has been largely reassuring.  But, it is important that you monitor your condition carefully, and do not hesitate to return to the ED if you develop new, or concerning changes in your condition.  Otherwise, please follow-up with your physician for appropriate ongoing care.    Cardiac Event Monitoring A cardiac event monitor is a small recording device used to help detect abnormal heart rhythms (arrhythmias). The monitor is used to record heart rhythm when noticeable symptoms such as the following occur:  Fast heartbeats (palpitations), such as heart racing or fluttering.  Dizziness.  Fainting or light-headedness.  Unexplained weakness. The monitor is wired to two electrodes placed on your chest. Electrodes are flat, sticky disks that attach to your skin. The monitor can be worn for up to 30 days. You will wear the monitor at all times, except when bathing.  HOW TO USE YOUR CARDIAC EVENT MONITOR A technician will prepare your chest for the electrode placement. The technician will show you how to place the electrodes, how to work the monitor, and how to replace the batteries. Take time to practice using the monitor before you leave the office. Make sure you understand how to send the information from the monitor to your health care provider. This requires a telephone with a landline, not a cell phone. You need to:  Wear your monitor at all times, except when you are in water:  Do not get the monitor wet.  Take the monitor off when bathing. Do not swim or use a hot tub with it on.  Keep your skin clean. Do not put body lotion or moisturizer on your chest.  Change the electrodes daily or any time they stop sticking to your skin. You might need to use tape to keep them on.  It is possible that your skin under the electrodes could become irritated. To keep this from happening, try to put the electrodes in slightly different places on your chest. However,  they must remain in the area under your left breast and in the upper right section of your chest.  Make sure the monitor is safely clipped to your clothing or in a location close to your body that your health care provider recommends.  Press the button to record when you feel symptoms of heart trouble, such as dizziness, weakness, light-headedness, palpitations, thumping, shortness of breath, unexplained weakness, or a fluttering or racing heart. The monitor is always on and records what happened slightly before you pressed the button, so do not worry about being too late to get good information.  Keep a diary of your activities, such as walking, doing chores, and taking medicine. It is especially important to note what you were doing when you pushed the button to record your symptoms. This will help your health care provider determine what might be contributing to your symptoms. The information stored in your monitor will be reviewed by your health care provider alongside your diary entries.  Send the recorded information as recommended by your health care provider. It is important to understand that it will take some time for your health care provider to process the results.  Change the batteries as recommended by your health care provider. SEEK IMMEDIATE MEDICAL CARE IF:   You have chest pain.  You have extreme difficulty breathing or shortness of breath.  You develop a very fast heartbeat that persists.  You develop dizziness that does not go away.  You faint or constantly feel you are  about to faint. Document Released: 08/02/2008 Document Revised: 03/10/2014 Document Reviewed: 04/22/2013 [employer] Patient Information 2015 [place], [place]. This information is not intended to replace advice given to you by your health care provider. Make sure you discuss any questions you have with your health care provider.

## 2015-04-29 NOTE — ED Notes (Signed)
TO ED via [facility] from work-- had 2 syncopal episodes at 1715-- while at work. Pt states completely lost consciousness, woke up, and passed out again. Alert, oriented on arrival-- pt had a similar episode 3 months ago, had holter monitor on with no ectopy noted.

## 2015-05-15 ENCOUNTER — Ambulatory Visit (INDEPENDENT_AMBULATORY_CARE_PROVIDER_SITE_OTHER): Payer: Medicaid Other | Admitting: Obstetrics

## 2015-05-15 ENCOUNTER — Encounter: Payer: Self-pay | Admitting: Obstetrics

## 2015-05-15 VITALS — BP 107/76 | HR 68 | Temp 97.9°F | Ht 64.0 in | Wt 120.8 lb

## 2015-05-15 DIAGNOSIS — R87612 Low grade squamous intraepithelial lesion on cytologic smear of cervix (LGSIL): Secondary | ICD-10-CM

## 2015-05-15 DIAGNOSIS — Z01812 Encounter for preprocedural laboratory examination: Secondary | ICD-10-CM

## 2015-05-15 LAB — POCT URINE PREGNANCY: Preg Test, Ur: NEGATIVE

## 2015-05-15 NOTE — Progress Notes (Signed)
Colposcopy Procedure Note  Indications: Pap smear 2 months ago showed: low-grade squamous intraepithelial neoplasia (LGSIL - encompassing HPV,mild dysplasia,CIN I). The prior pap showed no abnormalities.  Prior cervical/vaginal disease: normal exam without visible pathology. Prior cervical treatment: no treatment.  Procedure Details  The risks and benefits of the procedure and Written informed consent obtained.  A time-out was performed confirming the patient, procedure and allergy status  Speculum placed in vagina and excellent visualization of cervix achieved, cervix swabbed x 3 with acetic acid solution.  Findings: Cervix: no visible lesions, no mosaicism, no punctation and no abnormal vasculature; SCJ visualized 360 degrees without lesions, endocervical curettage performed, cervical biopsies taken at 6 and 12 o'clock, specimen labelled and sent to pathology and hemostasis achieved with silver nitrate.   Vaginal inspection: normal without visible lesions. Vulvar colposcopy: vulvar colposcopy not performed.   Physical Exam   Specimens: ECC and Cervical Biopsies  Complications: none.  Plan: Specimens labelled and sent to Pathology. Will base further treatment on Pathology findings. Post biopsy instructions given to patient. Return to discuss Pathology results in 2 weeks.

## 2015-05-15 NOTE — Addendum Note (Signed)
Addended by: Henriette CombsHATTON, Maleea Camilo L on: 05/15/2015 03:33 PM   Modules accepted: Orders

## 2015-05-21 LAB — SURESWAB, VAGINOSIS/VAGINITIS PLUS
ATOPOBIUM VAGINAE: NOT DETECTED Log (cells/mL)
C. TROPICALIS, DNA: NOT DETECTED
C. albicans, DNA: DETECTED — AB
C. glabrata, DNA: NOT DETECTED
C. parapsilosis, DNA: NOT DETECTED
C. trachomatis RNA, TMA: NOT DETECTED
Gardnerella vaginalis: 6.6 Log (cells/mL)
LACTOBACILLUS SPECIES: NOT DETECTED Log (cells/mL)
MEGASPHAERA SPECIES: DETECTED Log (cells/mL)
N. gonorrhoeae RNA, TMA: NOT DETECTED
T. vaginalis RNA, QL TMA: NOT DETECTED

## 2015-05-22 ENCOUNTER — Other Ambulatory Visit: Payer: Self-pay | Admitting: Obstetrics

## 2015-05-22 DIAGNOSIS — B373 Candidiasis of vulva and vagina: Secondary | ICD-10-CM

## 2015-05-22 DIAGNOSIS — B3731 Acute candidiasis of vulva and vagina: Secondary | ICD-10-CM

## 2015-05-22 MED ORDER — FLUCONAZOLE 150 MG PO TABS: 150.0000 mg | ORAL_TABLET | Freq: Once | ORAL | Status: DC

## 2015-06-01 ENCOUNTER — Ambulatory Visit (INDEPENDENT_AMBULATORY_CARE_PROVIDER_SITE_OTHER): Payer: Medicaid Other | Admitting: Obstetrics

## 2015-06-01 ENCOUNTER — Encounter: Payer: Self-pay | Admitting: Obstetrics

## 2015-06-01 VITALS — BP 110/72 | HR 80 | Wt 119.0 lb

## 2015-06-01 DIAGNOSIS — R896 Abnormal cytological findings in specimens from other organs, systems and tissues: Secondary | ICD-10-CM

## 2015-06-01 DIAGNOSIS — N760 Acute vaginitis: Secondary | ICD-10-CM | POA: Diagnosis not present

## 2015-06-01 DIAGNOSIS — IMO0002 Reserved for concepts with insufficient information to code with codable children: Secondary | ICD-10-CM

## 2015-06-01 DIAGNOSIS — A499 Bacterial infection, unspecified: Secondary | ICD-10-CM

## 2015-06-01 DIAGNOSIS — B9689 Other specified bacterial agents as the cause of diseases classified elsewhere: Secondary | ICD-10-CM

## 2015-06-01 MED ORDER — TINIDAZOLE 500 MG PO TABS: 1000.0000 mg | ORAL_TABLET | Freq: Every day | ORAL | Status: DC

## 2015-06-01 NOTE — Progress Notes (Signed)
Patient ID: Rachael Oliver, female   DOB: February 16, 1991, 24 y.o.   MRN: [id number]  Chief Complaint  Patient presents with  . Follow-up    colpo results    HPI Rachael Oliver is a 24 y.o. female.  Presents for colposcopy results and management.  HPI  Past Medical History  Diagnosis Date  . Asthma     as a child    Past Surgical History  Procedure Laterality Date  . Foot surgery      Family History  Problem Relation Age of Onset  . Alcohol abuse Neg Hx   . Arthritis Neg Hx   . Asthma Neg Hx   . Birth defects Neg Hx   . COPD Neg Hx   . Cancer Neg Hx   . Depression Neg Hx   . Diabetes Neg Hx   . Drug abuse Neg Hx   . Early death Neg Hx   . Hearing loss Neg Hx   . Heart disease Neg Hx   . Hyperlipidemia Neg Hx   . Hypertension Neg Hx   . Kidney disease Neg Hx   . Learning disabilities Neg Hx   . Mental illness Neg Hx   . Mental retardation Neg Hx   . Miscarriages / Stillbirths Neg Hx   . Stroke Neg Hx   . Vision loss Neg Hx     Social History History  Substance Use Topics  . Smoking status: Never Smoker   . Smokeless tobacco: Never Used  . Alcohol Use: No    No Known Allergies  Current Outpatient Prescriptions  Medication Sig Dispense Refill  . amoxicillin-clavulanate (AUGMENTIN) 875-125 MG per tablet Take 1 tablet by mouth 2 (two) times daily. One po bid x 7 days (Patient not taking: Reported on 05/15/2015) 14 tablet 0  . fluconazole (DIFLUCAN) 150 MG tablet Take 1 tablet (150 mg total) by mouth once. (Patient not taking: Reported on 06/01/2015) 1 tablet 2  . ibuprofen (ADVIL,MOTRIN) 800 MG tablet Take 1 tablet (800 mg total) by mouth 3 (three) times daily. (Patient not taking: Reported on 05/15/2015) 21 tablet 0  . tinidazole (TINDAMAX) 500 MG tablet Take 2 tablets (1,000 mg total) by mouth daily with breakfast. 10 tablet 2  . triamcinolone ointment (KENALOG) 0.5 % Apply 1 application topically 2 (two) times daily. (Patient not taking: Reported on 02/13/2015) 60 g 1    No current facility-administered medications for this visit.    Review of Systems Review of Systems Constitutional: negative for fatigue and weight loss Respiratory: negative for cough and wheezing Cardiovascular: negative for chest pain, fatigue and palpitations Gastrointestinal: negative for abdominal pain and change in bowel habits Genitourinary:negative Integument/breast: negative for nipple discharge Musculoskeletal:negative for myalgias Neurological: negative for gait problems and tremors Behavioral/Psych: negative for abusive relationship, depression Endocrine: negative for temperature intolerance     Blood pressure 110/72, pulse 80, weight 119 lb (53.978 kg), not currently breastfeeding.  Physical Exam Physical Exam : Deferred                                                                                          100% of  10 min visit spent on counseling and coordination of care.   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   Meds ordered this encounter  Medications  . tinidazole (TINDAMAX) 500 MG tablet    Sig: Take 2 tablets (1,000 mg total) by mouth daily with breakfast.    Dispense:  10 tablet    Refill:  2

## 2015-06-02 ENCOUNTER — Emergency Department (HOSPITAL_COMMUNITY)
Admission: EM | Admit: 2015-06-02 | Discharge: 2015-06-03 | Disposition: A | Discharge status: Home / Self Care | Payer: Medicaid Other | Attending: Emergency Medicine | Admitting: Emergency Medicine

## 2015-06-02 ENCOUNTER — Encounter (HOSPITAL_COMMUNITY): Payer: Self-pay | Admitting: Emergency Medicine

## 2015-06-02 DIAGNOSIS — S0012XA Contusion of left eyelid and periocular area, initial encounter: Secondary | ICD-10-CM | POA: Insufficient documentation

## 2015-06-02 DIAGNOSIS — Y9302 Activity, running: Secondary | ICD-10-CM | POA: Diagnosis not present

## 2015-06-02 DIAGNOSIS — Y998 Other external cause status: Secondary | ICD-10-CM | POA: Insufficient documentation

## 2015-06-02 DIAGNOSIS — W2209XA Striking against other stationary object, initial encounter: Secondary | ICD-10-CM | POA: Diagnosis not present

## 2015-06-02 DIAGNOSIS — J45909 Unspecified asthma, uncomplicated: Secondary | ICD-10-CM | POA: Insufficient documentation

## 2015-06-02 DIAGNOSIS — Y9289 Other specified places as the place of occurrence of the external cause: Secondary | ICD-10-CM | POA: Insufficient documentation

## 2015-06-02 DIAGNOSIS — S0083XA Contusion of other part of head, initial encounter: Secondary | ICD-10-CM

## 2015-06-02 DIAGNOSIS — S0993XA Unspecified injury of face, initial encounter: Secondary | ICD-10-CM | POA: Diagnosis present

## 2015-06-02 NOTE — ED Notes (Signed)
Pt. accidentally hit her face against a door this evening , presents with swelling at left upper cheek .

## 2015-06-02 NOTE — ED Provider Notes (Signed)
CSN: [id number]     Arrival date & time 06/02/15  2310 History   This chart was scribed for Langston Masker, PA-C working with Mancel Bale, MD by Elveria Rising, ED Scribe. This patient was seen in room TR10C/TR10C and the patient's care was started at 11:34 PM.   Chief Complaint  Patient presents with  . Facial Injury   The history is provided by the patient. No language interpreter was used.   HPI Comments: Rachael Oliver is a 24 y.o. female who presents to the Emergency Department complaining facial injury sustained when running into a door tonight. Patient reports sore pain and swelling to left cheek, just below the eye. Patient reports briefly blurred vision after striking her face, but denies any loss of conscoiusness. Patient has a dermal piercing at the site; she is afraid it has migrated as result of the impact. Patient denies being assaulted or danger at home.  Patient reports updated Tetanus last year when having her child.   Past Medical History  Diagnosis Date  . Asthma     as a child   Past Surgical History  Procedure Laterality Date  . Foot surgery     Family History  Problem Relation Age of Onset  . Alcohol abuse Neg Hx   . Arthritis Neg Hx   . Asthma Neg Hx   . Birth defects Neg Hx   . COPD Neg Hx   . Cancer Neg Hx   . Depression Neg Hx   . Diabetes Neg Hx   . Drug abuse Neg Hx   . Early death Neg Hx   . Hearing loss Neg Hx   . Heart disease Neg Hx   . Hyperlipidemia Neg Hx   . Hypertension Neg Hx   . Kidney disease Neg Hx   . Learning disabilities Neg Hx   . Mental illness Neg Hx   . Mental retardation Neg Hx   . Miscarriages / Stillbirths Neg Hx   . Stroke Neg Hx   . Vision loss Neg Hx    History  Substance Use Topics  . Smoking status: Never Smoker   . Smokeless tobacco: Never Used  . Alcohol Use: No   OB History    Gravida Para Term Preterm AB TAB SAB Ectopic Multiple Living   Review of Systems  Constitutional: Negative for  fever.  HENT: Positive for facial swelling.   Skin: Positive for color change.  Neurological: Negative for light-headedness and headaches.  All other systems reviewed and are negative.     Allergies  Review of patient's allergies indicates no known allergies.  Home Medications   Prior to Admission medications   Medication Sig Start Date End Date Taking? Authorizing Provider  amoxicillin-clavulanate (AUGMENTIN) 875-125 MG per tablet Take 1 tablet by mouth 2 (two) times daily. One po bid x 7 days Patient not taking: Reported on 05/15/2015 02/13/15   Arby Barrette, MD  fluconazole (DIFLUCAN) 150 MG tablet Take 1 tablet (150 mg total) by mouth once. Patient not taking: Reported on 06/01/2015 05/22/15   Brock Bad, MD  ibuprofen (ADVIL,MOTRIN) 800 MG tablet Take 1 tablet (800 mg total) by mouth 3 (three) times daily. Patient not taking: Reported on 05/15/2015 02/13/15   Arby Barrette, MD  tinidazole [facility]) 500 MG tablet Take 2 tablets (1,000 mg total) by mouth daily with breakfast. 06/01/15   Brock Bad, MD  triamcinolone ointment (  KENALOG) 0.5 % Apply 1 application topically 2 (two) times daily. Patient not taking: Reported on 02/13/2015 12/03/14   Rodolph Bong, MD   Triage Vitals: BP 126/75 mmHg  Pulse 88  Temp(Src) 99.3 F (37.4 C) (Oral)  Resp 16  SpO2 98% Physical Exam  Constitutional: She is oriented to person, place, and time. She appears well-developed and well-nourished. No distress.  HENT:  Head: Normocephalic and atraumatic.   Bruising under left eye.  Palpable end of peircing  Eyes: EOM are normal.  Neck: Neck supple. No tracheal deviation present.  Cardiovascular: Normal rate.   Pulmonary/Chest: Effort normal. No respiratory distress.  Musculoskeletal: Normal range of motion.  Neurological: She is alert and oriented to person, place, and time.  Skin: Skin is warm and dry.  Psychiatric: She has a normal mood and affect. Her behavior is normal.  Nursing note and  vitals reviewed.   ED Course  Procedures (including critical care time)  COORDINATION OF CARE: 11:42 PM- Discussed treatment plan with patient at bedside and patient agreed to plan.   Labs Review Labs Reviewed - No data to display  Imaging Review Ct Maxillofacial Wo Cm  06/03/2015   CLINICAL DATA:  Struck face on a door.  EXAM: CT MAXILLOFACIAL WITHOUT CONTRAST  TECHNIQUE: Multidetector CT imaging of the maxillofacial structures was performed. Multiplanar CT image reconstructions were also generated. A small metallic BB was placed on the right temple in order to reliably differentiate right from left.  COMPARISON:  01/1915  FINDINGS: There is no facial fracture. Orbits are intact. Orbital floors are intact. Maxillary sinuses are intact. Pterygoid plates and zygomatic arches are intact.  There is mild soft tissue swelling in the anterior left temporal region. There is a small cosmetic piercing in the area. No hematoma is evident. No underlying bony injury.  IMPRESSION: Negative for fracture or significant hematoma. Mild soft tissue swelling in the left anterior temporal region.   Electronically Signed   By: Ellery Plunk M.D.   On: 06/03/2015 00:38     EKG Interpretation None      MDM   Final diagnoses:  Contusion of face, initial encounter    augmentin  Hydrocodone Pt counseled on wound care and possible infection due to injury and peircing   I personally performed the services in this documentation, which was scribed in my presence.  The recorded information has been reviewed and considered.   Barnet Pall. Elson Areas, PA-C 06/03/15 0118  Elson Areas, PA-C 06/03/15 1610  Mancel Bale, MD 06/05/15 [phone]

## 2015-06-03 ENCOUNTER — Emergency Department (HOSPITAL_COMMUNITY): Payer: Medicaid Other

## 2015-06-03 MED ORDER — AMOXICILLIN-POT CLAVULANATE 875-125 MG PO TABS: 1.0000 | ORAL_TABLET | Freq: Two times a day (BID) | ORAL | Status: DC

## 2015-06-03 MED ORDER — HYDROCODONE-ACETAMINOPHEN 5-325 MG PO TABS: 2.0000 | ORAL_TABLET | ORAL | Status: DC | PRN

## 2015-06-03 NOTE — Discharge Instructions (Signed)
Contusion °A contusion is a deep bruise. Contusions are the result of an injury that caused bleeding under the skin. The contusion may turn blue, purple, or yellow. Minor injuries will give you a painless contusion, but more severe contusions may stay painful and swollen for a few weeks.  °CAUSES  °A contusion is usually caused by a blow, trauma, or direct force to an area of the body. °SYMPTOMS  °· Swelling and redness of the injured area. °· Bruising of the injured area. °· Tenderness and soreness of the injured area. °· Pain. °DIAGNOSIS  °The diagnosis can be made by taking a history and physical exam. An X-ray, CT scan, or MRI may be needed to determine if there were any associated injuries, such as fractures. °TREATMENT  °Specific treatment will depend on what area of the body was injured. In general, the best treatment for a contusion is resting, icing, elevating, and applying cold compresses to the injured area. Over-the-counter medicines may also be recommended for pain control. Ask your caregiver what the best treatment is for your contusion. °HOME CARE INSTRUCTIONS  °· Put ice on the injured area. °¨ Put ice in a plastic bag. °¨ Place a towel between your skin and the bag. °¨ Leave the ice on for 15-20 minutes, 3-4 times a day, or as directed by your health care provider. °· Only take over-the-counter or prescription medicines for pain, discomfort, or fever as directed by your caregiver. Your caregiver may recommend avoiding anti-inflammatory medicines (aspirin, ibuprofen, and naproxen) for 48 hours because these medicines may increase bruising. °· Rest the injured area. °· If possible, elevate the injured area to reduce swelling. °SEEK IMMEDIATE MEDICAL CARE IF:  °· You have increased bruising or swelling. °· You have pain that is getting worse. °· Your swelling or pain is not relieved with medicines. °MAKE SURE YOU:  °· Understand these instructions. °· Will watch your condition. °· Will get help right  away if you are not doing well or get worse. °Document Released: 08/03/2005 Document Revised: 10/29/2013 Document Reviewed: 08/29/2011 °ExitCare® Patient Information ©2015 [employer]. This information is not intended to replace advice given to you by your health care provider. Make sure you discuss any questions you have with your health care provider. ° °

## 2015-06-05 ENCOUNTER — Telehealth: Payer: Self-pay | Admitting: *Deleted

## 2015-06-05 NOTE — Telephone Encounter (Signed)
Per Dr. Clearance Coots: Patient to have a cyro approximately 2 weeks from her last appointment. Patient has been scheduled for 06-19-15 @ 9:30 am. Patient advised no unprotected intercourse. Patient verbalized understanding.

## 2015-06-16 ENCOUNTER — Telehealth: Payer: Self-pay | Admitting: *Deleted

## 2015-06-16 NOTE — Telephone Encounter (Signed)
Patient wanted to know if there was anything that she need to make sure she did not do before she had her procedure on Friday. Patient advised to make sure she she continues to not have unprotected intercourse. Patient advised to take Ibuprofen 800 mg once by mouth before coming in for procedure. Patient advised it is okay [place] about daily activities and to eat as normal. Patient verbalized understanding.

## 2015-06-18 ENCOUNTER — Telehealth: Payer: Self-pay | Admitting: *Deleted

## 2015-06-18 NOTE — Telephone Encounter (Signed)
Patient contacted the office stating she needs to cancel her appointment schedule for 06/19/15. Patient requesting to reschedule. Attempted to contact the patient to reschedule. Left message for patient to call the office.

## 2015-06-19 ENCOUNTER — Encounter: Payer: Self-pay | Admitting: Obstetrics

## 2015-06-19 ENCOUNTER — Other Ambulatory Visit: Payer: Self-pay | Admitting: Obstetrics

## 2015-06-19 ENCOUNTER — Ambulatory Visit (INDEPENDENT_AMBULATORY_CARE_PROVIDER_SITE_OTHER): Payer: Medicaid Other | Admitting: Obstetrics

## 2015-06-19 VITALS — BP 104/57 | HR 76 | Temp 98.3°F | Ht 65.0 in | Wt 121.0 lb

## 2015-06-19 DIAGNOSIS — Z01812 Encounter for preprocedural laboratory examination: Secondary | ICD-10-CM | POA: Diagnosis not present

## 2015-06-19 DIAGNOSIS — R896 Abnormal cytological findings in specimens from other organs, systems and tissues: Secondary | ICD-10-CM | POA: Diagnosis not present

## 2015-06-19 DIAGNOSIS — IMO0002 Reserved for concepts with insufficient information to code with codable children: Secondary | ICD-10-CM

## 2015-06-19 LAB — POCT URINE PREGNANCY: Preg Test, Ur: NEGATIVE

## 2015-06-19 NOTE — Progress Notes (Signed)
RATIONALE:      The reason for the procedure is CIN 2-3 PROCEDURE: After placement of the Grave's speculum, the appropriate cryoprobe was selected.  KY jelly was applied to the cryoprobe.  The probe was then applied to the cervix until an ice ball had formed.  This ice ball was then allowed to remain in place a three min..  The cryoprobe was removed and the cervix allowed to thaw for 5 minutes.  The cryoprobe then reapplied and the cervix frozen for another 3 minutes. INSTRUCTIONS: The cryoprobe was then removed.  The patient was given a preprinted instruction list and is instructed to return to the office in 2 weeks for a follow up examination.   Coral Ceo MD 06-19-15

## 2015-06-24 LAB — SURESWAB, VAGINOSIS/VAGINITIS PLUS
Atopobium vaginae: NOT DETECTED Log (cells/mL)
C. PARAPSILOSIS, DNA: NOT DETECTED
C. TRACHOMATIS RNA, TMA: NOT DETECTED
C. albicans, DNA: NOT DETECTED
C. glabrata, DNA: NOT DETECTED
C. tropicalis, DNA: NOT DETECTED
LACTOBACILLUS SPECIES: 6.4 Log (cells/mL)
MEGASPHAERA SPECIES: NOT DETECTED Log (cells/mL)
N. gonorrhoeae RNA, TMA: NOT DETECTED
T. VAGINALIS RNA, QL TMA: NOT DETECTED

## 2015-06-26 NOTE — Telephone Encounter (Signed)
Patient was seen for her appointment on 06/19/15.

## 2015-07-07 ENCOUNTER — Ambulatory Visit: Payer: Medicaid Other | Admitting: Obstetrics

## 2015-07-20 ENCOUNTER — Telehealth: Payer: Self-pay | Admitting: *Deleted

## 2015-07-20 NOTE — Telephone Encounter (Signed)
Patient request call back. 1:46 Call to patient- Patient states she has been spotting for 2 weeks- she had a cryo procedure done and has not been back to the office. Recommended patient return for recheck. She is using Depo for birth control and wants to start to get that here too. Told patient she needs to check with her primary to see when her next injection is due and let us know. She will call back. Encouraged again to come in.

## 2015-08-20 ENCOUNTER — Ambulatory Visit: Payer: Medicaid Other | Admitting: Obstetrics

## 2015-09-23 ENCOUNTER — Telehealth: Payer: Self-pay

## 2015-09-23 NOTE — Telephone Encounter (Signed)
RECEIVED REFERRAL FROM [facility], CALLED PATIENT AND [facility] M[facility] - SHE IS [facility]ABLISHED PATIENT HERE - REFERRAL STATED WANTS NEXPLANON CONSULT APPT

## 2015-10-07 ENCOUNTER — Ambulatory Visit (INDEPENDENT_AMBULATORY_CARE_PROVIDER_SITE_OTHER): Payer: Medicaid Other | Admitting: Obstetrics

## 2015-10-07 VITALS — BP 107/73 | HR 78 | Wt 121.0 lb

## 2015-10-07 DIAGNOSIS — R896 Abnormal cytological findings in specimens from other organs, systems and tissues: Secondary | ICD-10-CM | POA: Diagnosis not present

## 2015-10-07 DIAGNOSIS — IMO0002 Reserved for concepts with insufficient information to code with codable children: Secondary | ICD-10-CM

## 2015-10-07 DIAGNOSIS — A499 Bacterial infection, unspecified: Secondary | ICD-10-CM

## 2015-10-07 DIAGNOSIS — N760 Acute vaginitis: Secondary | ICD-10-CM

## 2015-10-07 DIAGNOSIS — B3731 Acute candidiasis of vulva and vagina: Secondary | ICD-10-CM

## 2015-10-07 DIAGNOSIS — B373 Candidiasis of vulva and vagina: Secondary | ICD-10-CM | POA: Diagnosis not present

## 2015-10-07 DIAGNOSIS — B9689 Other specified bacterial agents as the cause of diseases classified elsewhere: Secondary | ICD-10-CM

## 2015-10-07 MED ORDER — FLUCONAZOLE 150 MG PO TABS: 150.0000 mg | ORAL_TABLET | Freq: Once | ORAL | Status: DC

## 2015-10-07 MED ORDER — TINIDAZOLE 500 MG PO TABS: 1000.0000 mg | ORAL_TABLET | Freq: Every day | ORAL | Status: DC

## 2015-10-07 NOTE — Progress Notes (Signed)
Patient ID: Rachael Oliver, female   DOB: [birth year], 24 y.o.   MRN: [id number]  Chief Complaint  Patient presents with  . Follow-up    cryo in august, ? BV, discuss birth control    HPI Rachael PereyraKiarra S Poust is a 24 y.o. female.  Vaginal discharge and odor.  Denies itching.  Wants Nexplanon.  HPI  Past Medical History  Diagnosis Date  . Asthma     as a child    Past Surgical History  Procedure Laterality Date  . Foot surgery      Family History  Problem Relation Age of Onset  . Alcohol abuse Neg Hx   . Arthritis Neg Hx   . Asthma Neg Hx   . Birth defects Neg Hx   . COPD Neg Hx   . Cancer Neg Hx   . Depression Neg Hx   . Diabetes Neg Hx   . Drug abuse Neg Hx   . Early death Neg Hx   . Hearing loss Neg Hx   . Heart disease Neg Hx   . Hyperlipidemia Neg Hx   . Hypertension Neg Hx   . Kidney disease Neg Hx   . Learning disabilities Neg Hx   . Mental illness Neg Hx   . Mental retardation Neg Hx   . Miscarriages / Stillbirths Neg Hx   . Stroke Neg Hx   . Vision loss Neg Hx     Social History Social History  Substance Use Topics  . Smoking status: Never Smoker   . Smokeless tobacco: Never Used  . Alcohol Use: No    No Known Allergies  Current Outpatient Prescriptions  Medication Sig Dispense Refill  . amoxicillin-clavulanate (AUGMENTIN) 875-125 MG per tablet Take 1 tablet by mouth 2 (two) times daily. (Patient not taking: Reported on 10/07/2015) 14 tablet 0  . fluconazole (DIFLUCAN) 150 MG tablet Take 1 tablet (150 mg total) by mouth once. 1 tablet 2  . HYDROcodone-acetaminophen (NORCO/VICODIN) 5-325 MG per tablet Take 2 tablets by mouth every 4 (four) hours as needed. (Patient not taking: Reported on 06/19/2015) 16 tablet 0  . ibuprofen (ADVIL,MOTRIN) 800 MG tablet Take 1 tablet (800 mg total) by mouth 3 (three) times daily. (Patient not taking: Reported on 05/15/2015) 21 tablet 0  . tinidazole (TINDAMAX) 500 MG tablet Take 2 tablets (1,000 mg total) by mouth daily with  breakfast. 10 tablet 2  . triamcinolone ointment (KENALOG) 0.5 % Apply 1 application topically 2 (two) times daily. (Patient not taking: Reported on 02/13/2015) 60 g 1   No current facility-administered medications for this visit.    Review of Systems Review of Systems Constitutional: negative for fatigue and weight loss Respiratory: negative for cough and wheezing Cardiovascular: negative for chest pain, fatigue and palpitations Gastrointestinal: negative for abdominal pain and change in bowel habits Genitourinary: positive for vaginal discharge with odor Integument/breast: negative for nipple discharge Musculoskeletal:negative for myalgias Neurological: negative for gait problems and tremors Behavioral/Psych: negative for abusive relationship, depression Endocrine: negative for temperature intolerance     Blood pressure 107/73, pulse 78, weight 121 lb (54.885 kg), not currently breastfeeding.  Physical Exam Physical Exam           General:  Alert and no distress Abdomen:  normal findings: no organomegaly, soft, non-tender and no hernia  Pelvis:  External genitalia: normal general appearance Urinary system: urethral meatus normal and bladder without fullness, nontender Vaginal: normal without tenderness, induration or masses.  Grey, thin vaginal discharge Cervix: normal appearance  Adnexa: normal bimanual exam Uterus: anteverted and non-tender, normal size      Data Reviewed Labs  Assessment     Vaginitis.  Probably BV.  Contraceptive management.  Wants Nexplanon.    Plan    Tindamax / Diflucan Rx F/U in 2 weeks for Nexplanon insertion.  Orders Placed This Encounter  Procedures  . SureSwab, Vaginosis/Vaginitis Plus   Meds ordered this encounter  Medications  . tinidazole (TINDAMAX) 500 MG tablet    Sig: Take 2 tablets (1,000 mg total) by mouth daily with breakfast.    Dispense:  10 tablet    Refill:  2  . fluconazole (DIFLUCAN) 150 MG tablet    Sig: Take 1  tablet (150 mg total) by mouth once.    Dispense:  1 tablet    Refill:  2

## 2015-10-08 ENCOUNTER — Encounter: Payer: Self-pay | Admitting: Obstetrics

## 2015-10-11 LAB — SURESWAB, VAGINOSIS/VAGINITIS PLUS
Atopobium vaginae: 7.2 Log (cells/mL)
C. PARAPSILOSIS, DNA: NOT DETECTED
C. TRACHOMATIS RNA, TMA: NOT DETECTED
C. albicans, DNA: NOT DETECTED
C. glabrata, DNA: NOT DETECTED
C. tropicalis, DNA: NOT DETECTED
LACTOBACILLUS SPECIES: NOT DETECTED Log (cells/mL)
MEGASPHAERA SPECIES: >8 Log (cells/mL)
N. gonorrhoeae RNA, TMA: NOT DETECTED
T. VAGINALIS RNA, QL TMA: NOT DETECTED

## 2015-10-21 ENCOUNTER — Ambulatory Visit: Payer: Self-pay | Admitting: Obstetrics

## 2015-10-21 ENCOUNTER — Telehealth: Payer: Self-pay | Admitting: *Deleted

## 2015-10-21 NOTE — Telephone Encounter (Signed)
Attempted to contact the patient to reschedule her Nexplanon Insertion. There was no answer and mailbox was full, unable to leave a message.

## 2015-10-28 NOTE — Telephone Encounter (Signed)
Patient returned call to the office. Attempted to return patient's call and left message for patient to contact the office.

## 2015-10-29 NOTE — Telephone Encounter (Signed)
Patient contacted and has been scheduled for 11-16-15 for a nexplanon insertion and a 4 month repeat pap. Patient has been off Depo since May and has not had a cycle since then. Patient is sexually active and using condoms every time. Patient states last encounter was 2 weeks ago. Per Dr. Clearance CootsHarper okay to insert.

## 2015-11-16 ENCOUNTER — Ambulatory Visit: Payer: Self-pay | Admitting: Obstetrics

## 2015-11-29 ENCOUNTER — Emergency Department (HOSPITAL_COMMUNITY)
Admission: EM | Admit: 2015-11-29 | Discharge: 2015-11-29 | Disposition: A | Discharge status: Home / Self Care | Payer: Medicaid Other | Attending: Emergency Medicine | Admitting: Emergency Medicine

## 2015-11-29 ENCOUNTER — Emergency Department (HOSPITAL_COMMUNITY): Payer: Medicaid Other

## 2015-11-29 ENCOUNTER — Encounter (HOSPITAL_COMMUNITY): Payer: Self-pay | Admitting: Emergency Medicine

## 2015-11-29 DIAGNOSIS — Y9289 Other specified places as the place of occurrence of the external cause: Secondary | ICD-10-CM | POA: Insufficient documentation

## 2015-11-29 DIAGNOSIS — Z79899 Other long term (current) drug therapy: Secondary | ICD-10-CM | POA: Diagnosis not present

## 2015-11-29 DIAGNOSIS — J45909 Unspecified asthma, uncomplicated: Secondary | ICD-10-CM | POA: Diagnosis not present

## 2015-11-29 DIAGNOSIS — Y998 Other external cause status: Secondary | ICD-10-CM | POA: Diagnosis not present

## 2015-11-29 DIAGNOSIS — Y9389 Activity, other specified: Secondary | ICD-10-CM | POA: Insufficient documentation

## 2015-11-29 DIAGNOSIS — M25422 Effusion, left elbow: Secondary | ICD-10-CM | POA: Insufficient documentation

## 2015-11-29 DIAGNOSIS — S59902A Unspecified injury of left elbow, initial encounter: Secondary | ICD-10-CM | POA: Diagnosis present

## 2015-11-29 DIAGNOSIS — W228XXA Striking against or struck by other objects, initial encounter: Secondary | ICD-10-CM | POA: Insufficient documentation

## 2015-11-29 MED ORDER — IBUPROFEN 400 MG PO TABS
800.0000 mg | ORAL_TABLET | Freq: Once | ORAL | Status: AC
  Administered 2015-11-29: 800 mg via ORAL
  Filled 2015-11-29: qty 2

## 2015-11-29 MED ORDER — CEPHALEXIN 500 MG PO CAPS: 500.0000 mg | ORAL_CAPSULE | Freq: Four times a day (QID) | ORAL | Status: DC

## 2015-11-29 MED ORDER — HYDROCODONE-ACETAMINOPHEN 5-325 MG PO TABS: 2.0000 | ORAL_TABLET | ORAL | Status: DC | PRN

## 2015-11-29 MED ORDER — HYDROCODONE-ACETAMINOPHEN 5-325 MG PO TABS
1.0000 | ORAL_TABLET | Freq: Once | ORAL | Status: AC
  Administered 2015-11-29: 1 via ORAL
  Filled 2015-11-29: qty 1

## 2015-11-29 NOTE — ED Notes (Signed)
Declined W/C at D/C and was escorted to lobby by RN. 

## 2015-11-29 NOTE — ED Provider Notes (Signed)
Complains of swollen left elbow for the past 3 days. She reports there was "a pimple" at posterior elbow which she bumped yesterday causing it to drain. No other associated symptoms On exam alert nontoxic. Left upper extremity swollen red and tender overlying olecranon. Unable to extend elbow fully due to pain. No axillary nodes no red streaks proximally. Radial pulse 2+.. She is able to extend wrist easily. Patient current on tetanus immunization  Doug Sou, MD 11/29/15 [phone]

## 2015-11-29 NOTE — ED Notes (Signed)
Pt reports bump to left elbow onset 3 days ago. Pt opened the area and clear drainage came out. Now area red, swollen, warm to touch. Pt reports that she hit her elbow last night.

## 2015-11-29 NOTE — Discharge Instructions (Signed)
Take your medications as prescribed. I recommend continuing to apply an Ace wrap to your arm to help with swelling. He may also apply ice for 15-20 minutes 3-4 times daily to help with pain and swelling. Call to schedule to make a follow-up appointment with the orthopedic clinic listed above for follow-up this week. Return to the emergency department if symptoms worsen or new onset of fever, worsening redness/swelling, drainage, numbness, tingling, weakness.

## 2015-11-29 NOTE — ED Provider Notes (Signed)
CSN: [id number]     Arrival date & time 11/29/15  4098 History   First MD Initiated Contact with Patient 11/29/15 [phone]     Chief Complaint  Patient presents with  . Joint Swelling     (Consider location/radiation/quality/duration/timing/severity/associated sxs/prior Treatment) HPI   Patient is a 25 year old female with past medical history is asthma who presents the ED with complaint of left elbow swelling, onset yesterday. Patient reports she noticed a small bump/white head to her posterior left elbow 2 days ago. She notes she popped the white head resulting drainage of a small amount of white pus. Patient states the wound was healing until she hit it on the back of a toilet last night. She now endorses having redness and swelling to her elbow. She notes she noticed a small amount of clear drainage from the previous wound that drained after hitting her elbow last night. Patient denies taking any medications at home or using any form of treatment prior to arrival. Denies fever, chills, numbness, tingling, weakness. Patient denies any other known injury/trauma.  Past Medical History  Diagnosis Date  . Asthma     as a child   Past Surgical History  Procedure Laterality Date  . Foot surgery     Family History  Problem Relation Age of Onset  . Alcohol abuse Neg Hx   . Arthritis Neg Hx   . Asthma Neg Hx   . Birth defects Neg Hx   . COPD Neg Hx   . Cancer Neg Hx   . Depression Neg Hx   . Diabetes Neg Hx   . Drug abuse Neg Hx   . Early death Neg Hx   . Hearing loss Neg Hx   . Heart disease Neg Hx   . Hyperlipidemia Neg Hx   . Hypertension Neg Hx   . Kidney disease Neg Hx   . Learning disabilities Neg Hx   . Mental illness Neg Hx   . Mental retardation Neg Hx   . Miscarriages / Stillbirths Neg Hx   . Stroke Neg Hx   . Vision loss Neg Hx    Social History  Substance Use Topics  . Smoking status: Never Smoker   . Smokeless tobacco: Never Used  . Alcohol Use: No   OB History     Gravida Para Term Preterm AB TAB SAB Ectopic Multiple Living   Review of Systems  Constitutional: Negative for fever.  Musculoskeletal: Positive for joint swelling and arthralgias (left elbow).  Skin: Positive for wound.  Neurological: Negative for weakness and numbness.      Allergies  Review of patient's allergies indicates no known allergies.  Home Medications   Prior to Admission medications   Medication Sig Start Date End Date Taking? Authorizing Provider  amoxicillin-clavulanate (AUGMENTIN) 875-125 MG per tablet Take 1 tablet by mouth 2 (two) times daily. Patient not taking: Reported on 10/07/2015 06/03/15   Elson Areas, PA-C  cephALEXin (KEFLEX) 500 MG capsule Take 1 capsule (500 mg total) by mouth 4 (four) times daily. 11/29/15   Barrett Henle, PA-C  fluconazole (DIFLUCAN) 150 MG tablet Take 1 tablet (150 mg total) by mouth once. 10/07/15   Brock Bad, MD  HYDROcodone-acetaminophen (NORCO/VICODIN) 5-325 MG tablet Take 2 tablets by mouth every 4 (four) hours as needed. 11/29/15   Barrett Henle, PA-C  ibuprofen (ADVIL,MOTRIN) 800 MG tablet Take 1 tablet (800 mg total)  by mouth 3 (three) times daily. Patient not taking: Reported on 05/15/2015 02/13/15   Arby Barrette, MD  tinidazole [facility]) 500 MG tablet Take 2 tablets (1,000 mg total) by mouth daily with breakfast. 10/07/15   Brock Bad, MD  triamcinolone ointment (KENALOG) 0.5 % Apply 1 application topically 2 (two) times daily. Patient not taking: Reported on 02/13/2015 12/03/14   Rodolph Bong, MD   BP 112/60 mmHg  Pulse 74  Temp(Src) 97.8 F (36.6 C) (Oral)  Resp 16  Ht  (1.626 m)  Wt 55.14 kg  BMI 20.86 kg/m2  SpO2 100%  LMP   Breastfeeding? No Physical Exam  Constitutional: She is oriented to person, place, and time. She appears well-developed and well-nourished.  HENT:  Head: Normocephalic and atraumatic.  Eyes: Conjunctivae and EOM are normal. Right  eye exhibits no discharge. Left eye exhibits no discharge. No scleral icterus.  Neck: Normal range of motion. Neck supple.  Cardiovascular: Normal rate and intact distal pulses.   Pulmonary/Chest: Effort normal. No respiratory distress.  Abdominal: She exhibits no distension.  Musculoskeletal: Normal range of motion.  Swelling with mild surrounding erythema noted to left posterior elbow over the olecranon. TTP at left elbow medial and lateral epicondyles. FROM of left hand, wrist, forearm and shoulder. Slightly dec ROM with full flexion and extension of left elbow due to reported pain by pt. Sensation intact. 5/5 strength. 2+ radial pulse.   Neurological: She is alert and oriented to person, place, and time.  Skin: Skin is warm and dry.  Small healing/scabbed wound noted to posterior elbow with swelling and mild surrounding erythema and warmth, unable to expel any drainage with pressure/palpation. No induration or fluctuance.  Nursing note and vitals reviewed.   ED Course  .Joint Aspiration/Arthrocentesis Date/Time: 11/29/2015 9:01 AM Performed by: Barrett Henle Authorized by: Barrett Henle Consent: Verbal consent obtained. Risks and benefits: risks, benefits and alternatives were discussed Consent given by: patient Patient understanding: patient states understanding of the procedure being performed Patient identity confirmed: verbally with patient Indications: joint swelling  Body area: elbow Joint: left elbow Preparation: Patient was prepped and draped in the usual sterile fashion. Needle gauge: 18 G Approach: posterior Aspirate amount: 0 mL Patient tolerance: Patient tolerated the procedure well with no immediate complications Comments: Sterile procedures were followed during procedure. Site was prepped with betadine. No fluid was able to be aspirated from left olecranon bursa during procedure. Pt tolerated procedure well.     Labs Review Labs Reviewed - No  data to display  Imaging Review Dg Elbow Complete Left  11/29/2015  CLINICAL DATA:  Left elbow pain and swelling, no known injury, initial encounter EXAM: LEFT ELBOW - COMPLETE 3+ VIEW COMPARISON:  None. FINDINGS: No acute fracture or dislocation is noted. Mild soft tissue swelling is noted over the olecranon process. No significant joint effusion is seen. IMPRESSION: Mild soft tissue swelling without acute bony abnormality. Electronically Signed   By: Alcide Clever M.D.   On: 11/29/2015 08:09   I have personally reviewed and evaluated these images and lab results as part of my medical decision-making.  Filed Vitals:   11/29/15 0714 11/29/15 0912  BP: 110/70 112/60  Pulse: 73 74  Temp: 98.5 F (36.9 C) 97.8 F (36.6 C)  Resp: 18 16     MDM   Final diagnoses:  Elbow swelling, left    Patient presents with pain, swelling and redness to her left posterior elbow. Reports noticing a small whitehead  2 days ago that drained a small amount of pus, endorses hitting her elbow last night and now having worsening swelling and pain. Denies fever. VSS. Exam revealed swelling and mild surrounding erythema with warmth over her left olecranon process. Slightly decreased range of motion with full flexion and extension of left elbow due to reported pain, left arm otherwise neurovascularly intact. No evidence of abscess on exam. Presentation appears to be consistent with olecranon bursitis. Dr. Ethelda Chick evaluated patient. Due to concern for possible infection will consult ortho. Dr. Lajoyce Corners recommended draining bursa, apply a ACE wrap, start antibiotics and have pt follow up in his clinic this week. I was unable to drain fluid from left elbow. ACE wrap placed on left elbow and pt d/c home with pain meds and antibiotics. Advised patient to follow up with Dr. Lajoyce Corners in his clinic next week.  Evaluation does not show pathology requring ongoing emergent intervention or admission. Pt is hemodynamically stable and  mentating appropriately. Discussed findings/results and plan with patient/guardian, who agrees with plan. All questions answered. Return precautions discussed and outpatient follow up given.      Satira Sark [place], [place] 11/29/15 1251  Doug Sou, MD 11/29/15 [phone]

## 2015-12-01 ENCOUNTER — Emergency Department (HOSPITAL_COMMUNITY)
Admission: EM | Admit: 2015-12-01 | Discharge: 2015-12-01 | Disposition: A | Discharge status: Home / Self Care | Payer: Medicaid Other | Attending: Emergency Medicine | Admitting: Emergency Medicine

## 2015-12-01 ENCOUNTER — Encounter (HOSPITAL_COMMUNITY): Payer: Self-pay | Admitting: Emergency Medicine

## 2015-12-01 DIAGNOSIS — J45909 Unspecified asthma, uncomplicated: Secondary | ICD-10-CM | POA: Insufficient documentation

## 2015-12-01 DIAGNOSIS — Z3202 Encounter for pregnancy test, result negative: Secondary | ICD-10-CM | POA: Diagnosis not present

## 2015-12-01 DIAGNOSIS — Z79899 Other long term (current) drug therapy: Secondary | ICD-10-CM | POA: Insufficient documentation

## 2015-12-01 DIAGNOSIS — L02414 Cutaneous abscess of left upper limb: Secondary | ICD-10-CM | POA: Diagnosis present

## 2015-12-01 DIAGNOSIS — L03114 Cellulitis of left upper limb: Secondary | ICD-10-CM | POA: Diagnosis not present

## 2015-12-01 DIAGNOSIS — L0291 Cutaneous abscess, unspecified: Secondary | ICD-10-CM

## 2015-12-01 DIAGNOSIS — Z792 Long term (current) use of antibiotics: Secondary | ICD-10-CM | POA: Diagnosis not present

## 2015-12-01 LAB — COMPREHENSIVE METABOLIC PANEL
ALT: 14 U/L (ref 14–54)
ANION GAP: 13 (ref 5–15)
AST: 20 U/L (ref 15–41)
Albumin: 4 g/dL (ref 3.5–5.0)
Alkaline Phosphatase: 80 U/L (ref 38–126)
BUN: 12 mg/dL (ref 6–20)
CHLORIDE: 103 mmol/L (ref 101–111)
CO2: 24 mmol/L (ref 22–32)
CREATININE: 0.99 mg/dL (ref 0.44–1.00)
Calcium: 9.5 mg/dL (ref 8.9–10.3)
Glucose, Bld: 92 mg/dL (ref 65–99)
POTASSIUM: 3.9 mmol/L (ref 3.5–5.1)
Sodium: 140 mmol/L (ref 135–145)
Total Bilirubin: 0.9 mg/dL (ref 0.3–1.2)
Total Protein: 7.6 g/dL (ref 6.5–8.1)

## 2015-12-01 LAB — CBC WITH DIFFERENTIAL/PLATELET
BASOS ABS: 0 10*3/uL (ref 0.0–0.1)
Basophils Relative: 0 %
EOS PCT: 1 %
Eosinophils Absolute: 0 10*3/uL (ref 0.0–0.7)
HCT: 37.5 % (ref 36.0–46.0)
Hemoglobin: 12.2 g/dL (ref 12.0–15.0)
LYMPHS PCT: 29 %
Lymphs Abs: 2.2 10*3/uL (ref 0.7–4.0)
MCH: 26.6 pg (ref 26.0–34.0)
MCHC: 32.5 g/dL (ref 30.0–36.0)
MCV: 81.7 fL (ref 78.0–100.0)
Monocytes Absolute: 0.9 10*3/uL (ref 0.1–1.0)
Monocytes Relative: 12 %
Neutro Abs: 4.4 10*3/uL (ref 1.7–7.7)
Neutrophils Relative %: 58 %
PLATELETS: 228 10*3/uL (ref 150–400)
RBC: 4.59 MIL/uL (ref 3.87–5.11)
RDW: 13.2 % (ref 11.5–15.5)
WBC: 7.5 10*3/uL (ref 4.0–10.5)

## 2015-12-01 LAB — URINALYSIS, ROUTINE W REFLEX MICROSCOPIC
Bilirubin Urine: NEGATIVE
GLUCOSE, UA: NEGATIVE mg/dL
Hgb urine dipstick: NEGATIVE
Ketones, ur: NEGATIVE mg/dL
LEUKOCYTES UA: NEGATIVE
Nitrite: NEGATIVE
PROTEIN: NEGATIVE mg/dL
SPECIFIC GRAVITY, URINE: 1.022 (ref 1.005–1.030)
pH: 6 (ref 5.0–8.0)

## 2015-12-01 LAB — POC URINE PREG, ED: PREG TEST UR: NEGATIVE

## 2015-12-01 MED ORDER — SULFAMETHOXAZOLE-TRIMETHOPRIM 800-160 MG PO TABS: 1.0000 | ORAL_TABLET | Freq: Two times a day (BID) | ORAL | Status: AC

## 2015-12-01 MED ORDER — SULFAMETHOXAZOLE-TRIMETHOPRIM 800-160 MG PO TABS
1.0000 | ORAL_TABLET | Freq: Once | ORAL | Status: AC
  Administered 2015-12-01: 1 via ORAL
  Filled 2015-12-01: qty 1

## 2015-12-01 MED ORDER — OXYCODONE-ACETAMINOPHEN 5-325 MG PO TABS: 1.0000 | ORAL_TABLET | Freq: Four times a day (QID) | ORAL | Status: DC | PRN

## 2015-12-01 MED ORDER — OXYCODONE-ACETAMINOPHEN 5-325 MG PO TABS
2.0000 | ORAL_TABLET | Freq: Once | ORAL | Status: AC
  Administered 2015-12-01: 2 via ORAL
  Filled 2015-12-01: qty 2

## 2015-12-01 MED ORDER — IBUPROFEN 800 MG PO TABS: 800.0000 mg | ORAL_TABLET | Freq: Three times a day (TID) | ORAL | Status: DC | PRN

## 2015-12-01 NOTE — ED Provider Notes (Signed)
TIME SEEN: 5:00 AM  CHIEF COMPLAINT: Left elbow pain  HPI: Pt is a 25 y.o. right-hand-dominant female with history of asthma who presents the emergency department with left elbow pain and swelling for the past several days. Was seen in the emergency room and a 11/29/15 and was told she has olecranon bursitis. They attempted to drain the bursa but were unable to drain this area successfully. She was started on Keflex and has follow-up with Dr. Lajoyce Corners in 2 days. She states that she can extend her elbow fully but does have pain with flexion because it makes this area tight. She denies to me that she feels that the swelling or redness is worse. It appears she is here because she has run out of Vicodin. She states she cannot control the pain at home with over-the-counter medication. She denies any fever. Denies any history of injury to this area. No numbness, tingling or focal weakness. She is not a diabetic or immunocompromised.  ROS: See HPI Constitutional: no fever  Eyes: no drainage  ENT: no runny nose   Cardiovascular:  no chest pain  Resp: no SOB  GI: no vomiting GU: no dysuria Integumentary: no rash  Allergy: no hives  Musculoskeletal: no leg swelling  Neurological: no slurred speech ROS otherwise negative  PAST MEDICAL HISTORY/PAST SURGICAL HISTORY:  Past Medical History  Diagnosis Date  . Asthma     as a child    MEDICATIONS:  Prior to Admission medications   Medication Sig Start Date End Date Taking? Authorizing Provider  amoxicillin-clavulanate (AUGMENTIN) 875-125 MG per tablet Take 1 tablet by mouth 2 (two) times daily. Patient not taking: Reported on 10/07/2015 06/03/15   Elson Areas, PA-C  cephALEXin (KEFLEX) 500 MG capsule Take 1 capsule (500 mg total) by mouth 4 (four) times daily. 11/29/15   Barrett Henle, PA-C  fluconazole (DIFLUCAN) 150 MG tablet Take 1 tablet (150 mg total) by mouth once. 10/07/15   Brock Bad, MD  HYDROcodone-acetaminophen  (NORCO/VICODIN) 5-325 MG tablet Take 2 tablets by mouth every 4 (four) hours as needed. 11/29/15   Barrett Henle, PA-C  ibuprofen (ADVIL,MOTRIN) 800 MG tablet Take 1 tablet (800 mg total) by mouth 3 (three) times daily. Patient not taking: Reported on 05/15/2015 02/13/15   Arby Barrette, MD  tinidazole [facility]) 500 MG tablet Take 2 tablets (1,000 mg total) by mouth daily with breakfast. 10/07/15   Brock Bad, MD  triamcinolone ointment (KENALOG) 0.5 % Apply 1 application topically 2 (two) times daily. Patient not taking: Reported on 02/13/2015 12/03/14   Rodolph Bong, MD    ALLERGIES:  No Known Allergies  SOCIAL HISTORY:  Social History  Substance Use Topics  . Smoking status: Never Smoker   . Smokeless tobacco: Never Used  . Alcohol Use: No    FAMILY HISTORY: Family History  Problem Relation Age of Onset  . Alcohol abuse Neg Hx   . Arthritis Neg Hx   . Asthma Neg Hx   . Birth defects Neg Hx   . COPD Neg Hx   . Cancer Neg Hx   . Depression Neg Hx   . Diabetes Neg Hx   . Drug abuse Neg Hx   . Early death Neg Hx   . Hearing loss Neg Hx   . Heart disease Neg Hx   . Hyperlipidemia Neg Hx   . Hypertension Neg Hx   . Kidney disease Neg Hx   . Learning disabilities Neg Hx   .  Mental illness Neg Hx   . Mental retardation Neg Hx   . Miscarriages / Stillbirths Neg Hx   . Stroke Neg Hx   . Vision loss Neg Hx     EXAM: BP 114/73 mmHg  Pulse 81  Temp(Src) 97.6 F (36.4 C) (Oral)  Resp 16  Ht  (1.626 m)  Wt 122 lb (55.339 kg)  BMI 20.93 kg/m2  SpO2 97% CONSTITUTIONAL: Alert and oriented and responds appropriately to questions. Well-appearing; well-nourished HEAD: Normocephalic EYES: Conjunctivae clear, PERRL ENT: normal nose; no rhinorrhea; moist mucous membranes; pharynx without lesions noted NECK: Supple, no meningismus, no LAD  CARD: RRR; S1 and S2 appreciated; no murmurs, no clicks, no rubs, no gallops RESP: Normal chest excursion without splinting  or tachypnea; breath sounds clear and equal bilaterally; no wheezes, no rhonchi, no rales, no hypoxia or respiratory distress, speaking full sentences ABD/GI: Normal bowel sounds; non-distended; soft, non-tender, no rebound, no guarding, no peritoneal signs BACK:  The back appears normal and is non-tender to palpation, there is no CVA tenderness EXT: Patient has swelling to the left elbow. She has a 4 x 4 centimeter area of erythema and warmth around the lateral aspect of the elbow, no joint effusion. She is able to fully extend the elbow without pain or difficulty. Does have some pain with flexion of the elbow. 2+ radial pulses bilaterally. No bony tenderness or sign of any bony deformity. She has a 0.5 cm fluctuant area with obvious purulence underneath a thin layer of skin. Normal ROM in all joints; otherwise extrude his arm non-tender to palpation; no edema; normal capillary refill; no cyanosis, no calf tenderness or swelling    SKIN: Normal color for age and race; warm NEURO: Moves all extremities equally, sensation to light touch intact diffusely, cranial nerves II through XII intact PSYCH: The patient's mood and manner are appropriate. Grooming and personal hygiene are appropriate.  MEDICAL DECISION MAKING: Patient here with a small abscess to the left posterior elbow with surrounding cellulitis. It does not appear to be septic arthritis on exam. She was diagnosed with olecranon bursitis 2 days ago. There is no swelling of the bursa currently. She is on Keflex and it appears her erythema is improving but I will broaden her antibiotics with Bactrim to cover for MRSA. I have opened a small abscess that is very superficial. She does not require any packing as it was almost like a small blister that had pus underneath. I have sent a wound culture of this purulent drainage. Given first dose of Bactrim in the emergency part. We'll discharge with Percocet. She has follow-up with orthopedics in 2 days. Have  also given her primary care physician follow-up information. Discussed return precautions. She verbalized understanding and is comfortable with this plan.     INCISION AND DRAINAGE Performed by: Raelyn Number Consent: Verbal consent obtained. Risks and benefits: risks, benefits and alternatives were discussed Type: abscess  Body area: left elbow  Anesthesia: local infiltration  Incision was made with a scalpel.  Local anesthetic: none  Anesthetic total: 0 ml  Complexity: Superficial and simple   Drainage: purulent  Drainage amount: small  Packing material: none  Patient tolerance: Patient tolerated the procedure well with no immediate complications.        Layla Maw Ward, DO 12/01/15 [phone]

## 2015-12-01 NOTE — ED Notes (Signed)
Pt. reports worsening left elbow pain with swelling / abscess onset last week seen here 2 days ago prescribed with medications with no relief.

## 2015-12-01 NOTE — Discharge Instructions (Signed)
Please continue your Keflex as prescribed. We are also starting you on Bactrim. It appears the bursitis you had several days ago has improved. You still have cellulitis. There is no sign of a joint infection today. We have drain the small abscess to the outside of her elbow. Please follow-up with orthopedics as previously scheduled.   Abscess An abscess is an infected area that contains a collection of pus and debris.It can occur in almost any part of the body. An abscess is also known as a furuncle or boil. CAUSES  An abscess occurs when tissue gets infected. This can occur from blockage of oil or sweat glands, infection of hair follicles, or a minor injury to the skin. As the body tries to fight the infection, pus collects in the area and creates pressure under the skin. This pressure causes pain. People with weakened immune systems have difficulty fighting infections and get certain abscesses more often.  SYMPTOMS Usually an abscess develops on the skin and becomes a painful mass that is red, warm, and tender. If the abscess forms under the skin, you may feel a moveable soft area under the skin. Some abscesses break open (rupture) on their own, but most will continue to get worse without care. The infection can spread deeper into the body and eventually into the bloodstream, causing you to feel ill.  DIAGNOSIS  Your caregiver will take your medical history and perform a physical exam. A sample of fluid may also be taken from the abscess to determine what is causing your infection. TREATMENT  Your caregiver may prescribe antibiotic medicines to fight the infection. However, taking antibiotics alone usually does not cure an abscess. Your caregiver may need to make a small cut (incision) in the abscess to drain the pus. In some cases, gauze is packed into the abscess to reduce pain and to continue draining the area. HOME CARE INSTRUCTIONS   Only take over-the-counter or prescription medicines for  pain, discomfort, or fever as directed by your caregiver.  If you were prescribed antibiotics, take them as directed. Finish them even if you start to feel better.  If gauze is used, follow your caregiver's directions for changing the gauze.  To avoid spreading the infection:  Keep your draining abscess covered with a bandage.  Wash your hands well.  Do not share personal care items, towels, or whirlpools with others.  Avoid skin contact with others.  Keep your skin and clothes clean around the abscess.  Keep all follow-up appointments as directed by your caregiver. SEEK MEDICAL CARE IF:   You have increased pain, swelling, redness, fluid drainage, or bleeding.  You have muscle aches, chills, or a general ill feeling.  You have a fever. MAKE SURE YOU:   Understand these instructions.  Will watch your condition.  Will get help right away if you are not doing well or get worse.   This information is not intended to replace advice given to you by your health care provider. Make sure you discuss any questions you have with your health care provider.   Document Released: 08/03/2005 Document Revised: 04/24/2012 Document Reviewed: 01/06/2012 Elsevier Interactive Patient Education 2016 [employer]  Cellulitis Cellulitis is an infection of the skin and the tissue beneath it. The infected area is usually red and tender. Cellulitis occurs most often in the arms and lower legs.  CAUSES  Cellulitis is caused by bacteria that enter the skin through cracks or cuts in the skin. The most common types of bacteria  that cause cellulitis are staphylococci and streptococci. SIGNS AND SYMPTOMS   Redness and warmth.  Swelling.  Tenderness or pain.  Fever. DIAGNOSIS  Your health care provider can usually determine what is wrong based on a physical exam. Blood tests may also be done. TREATMENT  Treatment usually involves taking an antibiotic medicine. HOME CARE INSTRUCTIONS   Take  your antibiotic medicine as directed by your health care provider. Finish the antibiotic even if you start to feel better.  Keep the infected arm or leg elevated to reduce swelling.  Apply a warm cloth to the affected area up to 4 times per day to relieve pain.  Take medicines only as directed by your health care provider.  Keep all follow-up visits as directed by your health care provider. SEEK MEDICAL CARE IF:   You notice red streaks coming from the infected area.  Your red area gets larger or turns dark in color.  Your bone or joint underneath the infected area becomes painful after the skin has healed.  Your infection returns in the same area or another area.  You notice a swollen bump in the infected area.  You develop new symptoms.  You have a fever. SEEK IMMEDIATE MEDICAL CARE IF:   You feel very sleepy.  You develop vomiting or diarrhea.  You have a general ill feeling (malaise) with muscle aches and pains.   This information is not intended to replace advice given to you by your health care provider. Make sure you discuss any questions you have with your health care provider.   Document Released: 08/03/2005 Document Revised: 07/15/2015 Document Reviewed: 01/09/2012 Elsevier Interactive Patient Education [employer]

## 2015-12-01 NOTE — ED Notes (Signed)
Pt departed under her own power and in NAD.  

## 2015-12-02 ENCOUNTER — Encounter (HOSPITAL_COMMUNITY): Payer: Self-pay | Admitting: *Deleted

## 2015-12-02 ENCOUNTER — Other Ambulatory Visit (HOSPITAL_COMMUNITY): Payer: Self-pay | Admitting: Orthopaedic Surgery

## 2015-12-02 NOTE — Progress Notes (Signed)
Dr. Jacklynn Bue, Anesthesia, asked to review pt EKG; pt to be evaluated upon arrival on DOS.

## 2015-12-02 NOTE — Progress Notes (Signed)
Pt denies SOB, chest pain, and being under the care of a cardiologist. Pt denies having a cardiac cath but stated that a stress test was done " across the street from the hospital in the same building as [employer], not sure of the name of the doctor."  Pt ,made aware to stop taking Aspirin, vitamins, fish oil and herbal medications. Do not take any NSAIDs ie: Ibuprofen, Advil, Naproxen or any medication containing Aspirin. Pt verbalized understanding of all pre-op instructions.

## 2015-12-03 ENCOUNTER — Encounter (HOSPITAL_COMMUNITY): Payer: Self-pay | Admitting: *Deleted

## 2015-12-03 ENCOUNTER — Observation Stay (HOSPITAL_COMMUNITY)
Admission: RE | Admit: 2015-12-03 | Discharge: 2015-12-04 | Disposition: A | Discharge status: Home / Self Care | Payer: Medicaid Other | Source: Ambulatory Visit | Attending: Orthopaedic Surgery | Admitting: Orthopaedic Surgery

## 2015-12-03 ENCOUNTER — Encounter (HOSPITAL_COMMUNITY)
Admission: RE | Disposition: A | Discharge status: Home / Self Care | Payer: Self-pay | Source: Ambulatory Visit | Attending: Orthopaedic Surgery

## 2015-12-03 ENCOUNTER — Telehealth (HOSPITAL_BASED_OUTPATIENT_CLINIC_OR_DEPARTMENT_OTHER): Payer: Self-pay | Admitting: Emergency Medicine

## 2015-12-03 ENCOUNTER — Inpatient Hospital Stay (HOSPITAL_COMMUNITY): Payer: Medicaid Other | Admitting: Anesthesiology

## 2015-12-03 DIAGNOSIS — J45909 Unspecified asthma, uncomplicated: Secondary | ICD-10-CM | POA: Diagnosis not present

## 2015-12-03 DIAGNOSIS — M7022 Olecranon bursitis, left elbow: Principal | ICD-10-CM | POA: Insufficient documentation

## 2015-12-03 DIAGNOSIS — M71122 Other infective bursitis, left elbow: Secondary | ICD-10-CM

## 2015-12-03 HISTORY — PX: I&D EXTREMITY: SHX5045

## 2015-12-03 HISTORY — DX: Syncope and collapse: R55

## 2015-12-03 HISTORY — DX: Unspecified infectious disease: B99.9

## 2015-12-03 LAB — WOUND CULTURE

## 2015-12-03 SURGERY — IRRIGATION AND DEBRIDEMENT EXTREMITY
Anesthesia: General | Site: Elbow | Laterality: Left

## 2015-12-03 MED ORDER — LACTATED RINGERS IV SOLN
INTRAVENOUS | Status: DC | PRN
  Administered 2015-12-03: 15:00:00 via INTRAVENOUS

## 2015-12-03 MED ORDER — BACITRACIN ZINC 500 UNIT/GM EX OINT
TOPICAL_OINTMENT | CUTANEOUS | Status: AC
  Filled 2015-12-03: qty 28.35

## 2015-12-03 MED ORDER — ONDANSETRON HCL 4 MG/2ML IJ SOLN
INTRAMUSCULAR | Status: AC
  Filled 2015-12-03: qty 2

## 2015-12-03 MED ORDER — METOCLOPRAMIDE HCL 5 MG PO TABS: 5.0000 mg | ORAL_TABLET | Freq: Three times a day (TID) | ORAL | Status: DC | PRN

## 2015-12-03 MED ORDER — ONDANSETRON HCL 4 MG PO TABS: 4.0000 mg | ORAL_TABLET | Freq: Four times a day (QID) | ORAL | Status: DC | PRN

## 2015-12-03 MED ORDER — VANCOMYCIN HCL IN DEXTROSE 1-5 GM/200ML-% IV SOLN
INTRAVENOUS | Status: AC
  Filled 2015-12-03: qty 200

## 2015-12-03 MED ORDER — METOCLOPRAMIDE HCL 5 MG/ML IJ SOLN
5.0000 mg | Freq: Three times a day (TID) | INTRAMUSCULAR | Status: DC | PRN
Start: 2015-12-03 — End: 2015-12-04

## 2015-12-03 MED ORDER — LIDOCAINE HCL (CARDIAC) 20 MG/ML IV SOLN
INTRAVENOUS | Status: DC | PRN
  Administered 2015-12-03: 60 mg via INTRAVENOUS

## 2015-12-03 MED ORDER — SULFAMETHOXAZOLE-TRIMETHOPRIM 800-160 MG PO TABS
1.0000 | ORAL_TABLET | Freq: Two times a day (BID) | ORAL | Status: DC
  Administered 2015-12-03 – 2015-12-04 (×2): 1 via ORAL
  Filled 2015-12-03 (×2): qty 1

## 2015-12-03 MED ORDER — SULFAMETHOXAZOLE-TRIMETHOPRIM 800-160 MG PO TABS: 1.0000 | ORAL_TABLET | Freq: Two times a day (BID) | ORAL | Status: DC

## 2015-12-03 MED ORDER — LACTATED RINGERS IV SOLN
INTRAVENOUS | Status: DC
  Administered 2015-12-03: 14:00:00 via INTRAVENOUS

## 2015-12-03 MED ORDER — FENTANYL CITRATE (PF) 100 MCG/2ML IJ SOLN
INTRAMUSCULAR | Status: DC | PRN
  Administered 2015-12-03: 50 ug via INTRAVENOUS

## 2015-12-03 MED ORDER — 0.9 % SODIUM CHLORIDE (POUR BTL) OPTIME
TOPICAL | Status: DC | PRN
  Administered 2015-12-03: 1000 mL

## 2015-12-03 MED ORDER — HYDROMORPHONE HCL 1 MG/ML IJ SOLN
0.2500 mg | INTRAMUSCULAR | Status: DC | PRN
  Administered 2015-12-03: 0.5 mg via INTRAVENOUS

## 2015-12-03 MED ORDER — KETOROLAC TROMETHAMINE 30 MG/ML IJ SOLN
INTRAMUSCULAR | Status: AC
  Filled 2015-12-03: qty 1

## 2015-12-03 MED ORDER — SODIUM CHLORIDE 0.9 % IV SOLN
1000.0000 mg | INTRAVENOUS | Status: DC | PRN
  Administered 2015-12-03: 1000 mg via INTRAVENOUS

## 2015-12-03 MED ORDER — PROMETHAZINE HCL 25 MG/ML IJ SOLN: 6.2500 mg | INTRAMUSCULAR | Status: DC | PRN

## 2015-12-03 MED ORDER — SODIUM CHLORIDE 0.9 % IV SOLN: INTRAVENOUS | Status: DC

## 2015-12-03 MED ORDER — ACETAMINOPHEN 650 MG RE SUPP: 650.0000 mg | Freq: Four times a day (QID) | RECTAL | Status: DC | PRN

## 2015-12-03 MED ORDER — ONDANSETRON HCL 4 MG/2ML IJ SOLN
4.0000 mg | Freq: Four times a day (QID) | INTRAMUSCULAR | Status: DC | PRN
  Administered 2015-12-03: 4 mg via INTRAVENOUS
  Filled 2015-12-03: qty 2

## 2015-12-03 MED ORDER — ONDANSETRON HCL 4 MG/2ML IJ SOLN
INTRAMUSCULAR | Status: DC | PRN
  Administered 2015-12-03: 4 mg via INTRAVENOUS

## 2015-12-03 MED ORDER — DIPHENHYDRAMINE HCL 12.5 MG/5ML PO ELIX: 25.0000 mg | ORAL_SOLUTION | ORAL | Status: DC | PRN

## 2015-12-03 MED ORDER — ACETAMINOPHEN 325 MG PO TABS: 650.0000 mg | ORAL_TABLET | Freq: Four times a day (QID) | ORAL | Status: DC | PRN

## 2015-12-03 MED ORDER — SODIUM CHLORIDE 0.9 % IR SOLN
Status: DC | PRN
  Administered 2015-12-03: 3000 mL

## 2015-12-03 MED ORDER — PROPOFOL 10 MG/ML IV BOLUS
INTRAVENOUS | Status: DC | PRN
  Administered 2015-12-03: 150 mg via INTRAVENOUS

## 2015-12-03 MED ORDER — HYDROMORPHONE HCL 1 MG/ML IJ SOLN
INTRAMUSCULAR | Status: AC
  Filled 2015-12-03: qty 1

## 2015-12-03 MED ORDER — HYDROCODONE-ACETAMINOPHEN 5-325 MG PO TABS: 1.0000 | ORAL_TABLET | Freq: Four times a day (QID) | ORAL | Status: DC | PRN

## 2015-12-03 MED ORDER — HYDROCODONE-ACETAMINOPHEN 5-325 MG PO TABS
ORAL_TABLET | ORAL | Status: AC
  Filled 2015-12-03: qty 1

## 2015-12-03 MED ORDER — HYDROCODONE-ACETAMINOPHEN 7.5-325 MG PO TABS: 1.0000 | ORAL_TABLET | Freq: Once | ORAL | Status: DC | PRN

## 2015-12-03 MED ORDER — KETOROLAC TROMETHAMINE 30 MG/ML IJ SOLN
30.0000 mg | Freq: Once | INTRAMUSCULAR | Status: AC
  Administered 2015-12-03: 30 mg via INTRAVENOUS

## 2015-12-03 MED ORDER — BACITRACIN ZINC 500 UNIT/GM EX OINT
TOPICAL_OINTMENT | CUTANEOUS | Status: DC | PRN
  Administered 2015-12-03: 1 via TOPICAL

## 2015-12-03 MED ORDER — SODIUM CHLORIDE 0.9 % IR SOLN: Status: DC | PRN

## 2015-12-03 MED ORDER — FENTANYL CITRATE (PF) 250 MCG/5ML IJ SOLN
INTRAMUSCULAR | Status: AC
  Filled 2015-12-03: qty 5

## 2015-12-03 MED ORDER — MIDAZOLAM HCL 5 MG/5ML IJ SOLN
INTRAMUSCULAR | Status: DC | PRN
  Administered 2015-12-03: 2 mg via INTRAVENOUS

## 2015-12-03 MED ORDER — MIDAZOLAM HCL 2 MG/2ML IJ SOLN
INTRAMUSCULAR | Status: AC
  Filled 2015-12-03: qty 2

## 2015-12-03 MED ORDER — BACITRACIN-NEOMYCIN-POLYMYXIN 400-5-5000 EX OINT: TOPICAL_OINTMENT | CUTANEOUS | Status: DC | PRN

## 2015-12-03 SURGICAL SUPPLY — 68 items
BANDAGE ACE 4X5 VEL STRL LF (GAUZE/BANDAGES/DRESSINGS) ×2 IMPLANT
BANDAGE ELASTIC 3 VELCRO ST LF (GAUZE/BANDAGES/DRESSINGS) IMPLANT
BLADE SURG 10 STRL SS (BLADE) ×2 IMPLANT
BNDG COHESIVE 1X5 TAN STRL LF (GAUZE/BANDAGES/DRESSINGS) IMPLANT
BNDG COHESIVE 4X5 TAN STRL (GAUZE/BANDAGES/DRESSINGS) ×2 IMPLANT
BNDG COHESIVE 6X5 TAN STRL LF (GAUZE/BANDAGES/DRESSINGS) ×4 IMPLANT
BNDG CONFORM 3 STRL LF (GAUZE/BANDAGES/DRESSINGS) IMPLANT
BNDG GAUZE ELAST 4 BULKY (GAUZE/BANDAGES/DRESSINGS) ×2 IMPLANT
BNDG GAUZE STRTCH 6 (GAUZE/BANDAGES/DRESSINGS) ×6 IMPLANT
CANISTER SUCTION 2500CC (MISCELLANEOUS) ×4 IMPLANT
CORDS BIPOLAR (ELECTRODE) IMPLANT
COVER SURGICAL LIGHT HANDLE (MISCELLANEOUS) ×2 IMPLANT
CUFF TOURNIQUET SINGLE 18IN (TOURNIQUET CUFF) ×2 IMPLANT
CUFF TOURNIQUET SINGLE 24IN (TOURNIQUET CUFF) IMPLANT
CUFF TOURNIQUET SINGLE 34IN LL (TOURNIQUET CUFF) ×4 IMPLANT
CUFF TOURNIQUET SINGLE 44IN (TOURNIQUET CUFF) IMPLANT
DRAIN PENROSE 1/4X12 LTX STRL (WOUND CARE) ×2 IMPLANT
DRAPE EXTREMITY BILATERAL (DRAPES) IMPLANT
DRAPE IMP U-DRAPE 54X76 (DRAPES) IMPLANT
DRAPE INCISE IOBAN 66X45 STRL (DRAPES) ×8 IMPLANT
DRAPE SURG 17X23 STRL (DRAPES) IMPLANT
DRAPE U-SHAPE 47X51 STRL (DRAPES) ×2 IMPLANT
DRSG EMULSION OIL 3X3 NADH (GAUZE/BANDAGES/DRESSINGS) ×2 IMPLANT
DURAPREP 26ML APPLICATOR (WOUND CARE) ×2 IMPLANT
ELECT CAUTERY BLADE 6.4 (BLADE) ×2 IMPLANT
ELECT REM PT RETURN 9FT ADLT (ELECTROSURGICAL)
ELECTRODE REM PT RTRN 9FT ADLT (ELECTROSURGICAL) IMPLANT
FACESHIELD WRAPAROUND (MASK) IMPLANT
GAUZE SPONGE 4X4 12PLY STRL (GAUZE/BANDAGES/DRESSINGS) ×2 IMPLANT
GAUZE XEROFORM 1X8 LF (GAUZE/BANDAGES/DRESSINGS) ×2 IMPLANT
GAUZE XEROFORM 5X9 LF (GAUZE/BANDAGES/DRESSINGS) ×2 IMPLANT
GLOVE SKINSENSE NS SZ7.5 (GLOVE) ×2
GLOVE SKINSENSE STRL SZ7.5 (GLOVE) ×2 IMPLANT
GLOVE SURG SYN 7.5  E (GLOVE) ×1
GLOVE SURG SYN 7.5 E (GLOVE) ×1 IMPLANT
GOWN STRL REIN XL XLG (GOWN DISPOSABLE) ×4 IMPLANT
HANDPIECE INTERPULSE COAX TIP (DISPOSABLE)
KIT BASIN OR (CUSTOM PROCEDURE TRAY) ×2 IMPLANT
KIT ROOM TURNOVER OR (KITS) ×2 IMPLANT
MANIFOLD NEPTUNE II (INSTRUMENTS) IMPLANT
NS IRRIG 1000ML POUR BTL (IV SOLUTION) ×4 IMPLANT
PACK ORTHO EXTREMITY (CUSTOM PROCEDURE TRAY) ×2 IMPLANT
PAD ABD 8X10 STRL (GAUZE/BANDAGES/DRESSINGS) ×2 IMPLANT
PAD ARMBOARD 7.5X6 YLW CONV (MISCELLANEOUS) ×4 IMPLANT
PAD CAST 4YDX4 CTTN HI CHSV (CAST SUPPLIES) ×1 IMPLANT
PADDING CAST ABS 4INX4YD NS (CAST SUPPLIES) ×2
PADDING CAST ABS COTTON 4X4 ST (CAST SUPPLIES) ×2 IMPLANT
PADDING CAST COTTON 4X4 STRL (CAST SUPPLIES) ×1
PADDING CAST COTTON 6X4 STRL (CAST SUPPLIES) ×2 IMPLANT
SET CYSTO W/LG BORE CLAMP LF (SET/KITS/TRAYS/PACK) ×2 IMPLANT
SET HNDPC FAN SPRY TIP SCT (DISPOSABLE) IMPLANT
SPONGE LAP 18X18 X RAY DECT (DISPOSABLE) ×4 IMPLANT
STOCKINETTE IMPERVIOUS 9X36 MD (GAUZE/BANDAGES/DRESSINGS) ×2 IMPLANT
SUT ETHILON 2 0 FS 18 (SUTURE) ×6 IMPLANT
SUT ETHILON 2 0 PSLX (SUTURE) ×2 IMPLANT
SUT ETHILON 3 0 PS 1 (SUTURE) ×6 IMPLANT
SUT VIC AB 2-0 CT1 36 (SUTURE) ×2 IMPLANT
SUT VIC AB 2-0 FS1 27 (SUTURE) ×4 IMPLANT
SYR CONTROL 10ML LL (SYRINGE) IMPLANT
TOWEL OR 17X24 6PK STRL BLUE (TOWEL DISPOSABLE) ×2 IMPLANT
TOWEL OR 17X26 10 PK STRL BLUE (TOWEL DISPOSABLE) ×2 IMPLANT
TUBE ANAEROBIC SPECIMEN COL (MISCELLANEOUS) IMPLANT
TUBE CONNECTING 12X1/4 (SUCTIONS) ×2 IMPLANT
TUBE FEEDING 5FR 15 INCH (TUBING) IMPLANT
TUBING CYSTO DISP (UROLOGICAL SUPPLIES) ×2 IMPLANT
UNDERPAD 30X30 INCONTINENT (UNDERPADS AND DIAPERS) ×4 IMPLANT
WATER STERILE IRR 1000ML POUR (IV SOLUTION) ×2 IMPLANT
YANKAUER SUCT BULB TIP NO VENT (SUCTIONS) ×2 IMPLANT

## 2015-12-03 NOTE — Op Note (Signed)
   Date of Surgery: 12/03/2015  INDICATIONS: Rachael Oliver is a 25 y.o.-year-old female with a left septic olecranon bursitis that failed 2 oral antibiotics;  The patient did consent to the procedure after discussion of the risks and benefits.  PREOPERATIVE DIAGNOSIS: Septic left olecranon bursitis  POSTOPERATIVE DIAGNOSIS: Same.  PROCEDURE: Incision and drainage of left elbow olecranon bursa  SURGEON: N. Glee Arvin, M.D.  ASSIST: none.  ANESTHESIA:  general  IV FLUIDS AND URINE: See anesthesia.  ESTIMATED BLOOD LOSS: minimal mL.  IMPLANTS: none  DRAINS: 1/4 penrose  COMPLICATIONS: None.  DESCRIPTION OF PROCEDURE: The patient was brought to the operating room and placed supine on the operating table.  The patient had been signed prior to the procedure and this was documented. The patient had the anesthesia placed by the anesthesiologist.  A time-out was performed to confirm that this was the correct patient, site, side and location. The patient did receive antibiotics prior to the incision and was re-dosed during the procedure as needed at indicated intervals.  The patient had the operative extremity prepped and draped in the standard surgical fashion.    A posterior incision over the elbow was used. The infected skin was sharply excised in an elliptical fashion. 2 cultures were taken. Vancomycin was given after the cultures. Sharp excisional debridement of the olecranon bursa was performed with a rongeur. There was good bleeding tissue there is no evidence of deeper infection. This was thoroughly irrigated with normal saline. Quarter-inch Penrose drain was placed in the wound. The skin was approximated with 3-0 nylon. Sterile dressings were applied. Patient tolerated procedure well.  POSTOPERATIVE PLAN: Patient will be admitted overnight for IV antibiotics and plan for discharge in the morning on oral Bactrim.  Mayra Reel, MD [facility] [phone] 3:55 PM

## 2015-12-03 NOTE — Anesthesia Preprocedure Evaluation (Signed)
Anesthesia Evaluation  Patient identified by MRN, date of birth, ID band Patient awake    Reviewed: Allergy & Precautions, NPO status , Patient's Chart, lab work & pertinent test results  Airway Mallampati: II  TM Distance: >3 FB Neck ROM: Full    Dental  (+) Dental Advisory Given, Teeth Intact   Pulmonary asthma ,    breath sounds clear to auscultation       Cardiovascular negative cardio ROS   Rhythm:Regular Rate:Normal     Neuro/Psych negative neurological ROS     GI/Hepatic negative GI ROS, Neg liver ROS,   Endo/Other  negative endocrine ROS  Renal/GU negative Renal ROS     Musculoskeletal   Abdominal   Peds  Hematology negative hematology ROS (+)   Anesthesia Other Findings   Reproductive/Obstetrics                             Lab Results  Component Value Date   WBC 7.5 12/01/2015   HGB 12.2 12/01/2015   HCT 37.5 12/01/2015   MCV 81.7 12/01/2015   PLT 228 12/01/2015   Lab Results  Component Value Date   CREATININE 0.99 12/01/2015   BUN 12 12/01/2015   NA 140 12/01/2015   K 3.9 12/01/2015   CL 103 12/01/2015   CO2 24 12/01/2015    Anesthesia Physical Anesthesia Plan  ASA: II  Anesthesia Plan: General   Post-op Pain Management:    Induction: Intravenous  Airway Management Planned: LMA and Oral ETT  Additional Equipment:   Intra-op Plan:   Post-operative Plan: Extubation in OR  Informed Consent: I have reviewed the patients History and Physical, chart, labs and discussed the procedure including the risks, benefits and alternatives for the proposed anesthesia with the patient or authorized representative who has indicated his/her understanding and acceptance.   Dental advisory given  Plan Discussed with: CRNA  Anesthesia Plan Comments:         Anesthesia Quick Evaluation

## 2015-12-03 NOTE — Anesthesia Postprocedure Evaluation (Signed)
Anesthesia Post Note  Patient: Rachael Oliver  Procedure(s) Performed: Procedure(s) (LRB): IRRIGATION AND DEBRIDEMENT LEFT ELBOW (Left)  Patient location during evaluation: PACU Anesthesia Type: General Level of consciousness: awake and alert Pain management: pain level controlled Vital Signs Assessment: post-procedure vital signs reviewed and stable Respiratory status: spontaneous breathing Cardiovascular status: blood pressure returned to baseline Anesthetic complications: no    Last Vitals:  Filed Vitals:   12/03/15 1745 12/03/15 1800  BP: 103/59 98/62  Pulse: 88 59  Temp:    Resp: 24     Last Pain:  Filed Vitals:   12/03/15 1801  PainSc: [name]

## 2015-12-03 NOTE — Anesthesia Procedure Notes (Signed)
Procedure Name: LMA Insertion Date/Time: 12/03/2015 3:12 PM Performed by: Gavin Pound, Amyriah Buras J Pre-anesthesia Checklist: Patient identified, Emergency Drugs available, Suction available, Patient being monitored and Timeout performed Patient Re-evaluated:Patient Re-evaluated prior to inductionOxygen Delivery Method: Circle system utilized Preoxygenation: Pre-oxygenation with 100% oxygen Intubation Type: IV induction Ventilation: Mask ventilation without difficulty LMA: LMA inserted LMA Size: 3.0 Number of attempts: 1 Placement Confirmation: positive ETCO2 and breath sounds checked- equal and bilateral Tube secured with: Tape Dental Injury: Teeth and Oropharynx as per pre-operative assessment

## 2015-12-03 NOTE — Transfer of Care (Signed)
Immediate Anesthesia Transfer of Care Note  Patient: CHAI VERDEJO  Procedure(s) Performed: Procedure(s): IRRIGATION AND DEBRIDEMENT LEFT ELBOW (Left)  Patient Location: PACU  Anesthesia Type:General  Level of Consciousness: awake  Airway & Oxygen Therapy: Patient Spontanous Breathing and Patient connected to nasal cannula oxygen  Post-op Assessment: Report given to RN and Post -op Vital signs reviewed and stable  Post vital signs: Reviewed and stable  Last Vitals:  Filed Vitals:   12/03/15 1309  BP: 115/80  Pulse: 74  Temp: 37 C  Resp: 18    Complications: No apparent anesthesia complications

## 2015-12-03 NOTE — H&P (Signed)
PREOPERATIVE H&P  Chief Complaint: left olecranon bursitis  HPI: Rachael Oliver is a 25 y.o. female who presents for surgical treatment of left olecranon bursitis.  She denies any changes in medical history.  Past Medical History  Diagnosis Date  . Asthma     as a child  . Syncope   . Infection     left elbow   Past Surgical History  Procedure Laterality Date  . Foot surgery    . Uterine biopsy     Social History   Social History  . Marital Status: Single    Spouse Name: N/A  . Number of Children: N/A  . Years of Education: N/A   Social History Main Topics  . Smoking status: Never Smoker   . Smokeless tobacco: Never Used  . Alcohol Use: No  . Drug Use: No  . Sexual Activity: Yes    Birth Control/ Protection: Injection     Comment: last depo shot 06/2011   Other Topics Concern  . None   Social History Narrative   Family History  Problem Relation Age of Onset  . Alcohol abuse Neg Hx   . Arthritis Neg Hx   . Asthma Neg Hx   . Birth defects Neg Hx   . COPD Neg Hx   . Cancer Neg Hx   . Depression Neg Hx   . Diabetes Neg Hx   . Drug abuse Neg Hx   . Early death Neg Hx   . Hearing loss Neg Hx   . Heart disease Neg Hx   . Hyperlipidemia Neg Hx   . Hypertension Neg Hx   . Kidney disease Neg Hx   . Learning disabilities Neg Hx   . Mental illness Neg Hx   . Mental retardation Neg Hx   . Miscarriages / Stillbirths Neg Hx   . Stroke Neg Hx   . Vision loss Neg Hx    No Known Allergies Prior to Admission medications   Medication Sig Start Date End Date Taking? Authorizing Provider  cephALEXin (KEFLEX) 500 MG capsule Take 1 capsule (500 mg total) by mouth 4 (four) times daily. 11/29/15   Barrett Henle, PA-C  fluconazole (DIFLUCAN) 150 MG tablet Take 1 tablet (150 mg total) by mouth once. 10/07/15   Brock Bad, MD  ibuprofen (ADVIL,MOTRIN) 800 MG tablet Take 1 tablet (800 mg total) by mouth every 8 (eight) hours as needed for mild pain.  12/01/15   Kristen N Ward, DO  oxyCODONE-acetaminophen (PERCOCET/ROXICET) 5-325 MG tablet Take 1-2 tablets by mouth every 6 (six) hours as needed. 12/01/15   Kristen N Ward, DO  sulfamethoxazole-trimethoprim (BACTRIM DS,SEPTRA DS) 800-160 MG tablet Take 1 tablet by mouth 2 (two) times daily. 12/01/15 12/08/15  Kristen N Ward, DO  tinidazole (TINDAMAX) 500 MG tablet Take 2 tablets (1,000 mg total) by mouth daily with breakfast. 10/07/15   Brock Bad, MD     Positive ROS: All other systems have been reviewed and were otherwise negative with the exception of those mentioned in the HPI and as above.  Physical Exam: General: Alert, no acute distress Cardiovascular: No pedal edema Respiratory: No cyanosis, no use of accessory musculature GI: abdomen soft Skin: No lesions in the area of chief complaint Neurologic: Sensation intact distally Psychiatric: Patient is competent for consent with normal mood and affect Lymphatic: no lymphedema  MUSCULOSKELETAL: exam stable  Assessment: left olecranon bursitis  Plan: Plan for Procedure(s): IRRIGATION AND DEBRIDEMENT LEFT ELBOW  The  risks benefits and alternatives were discussed with the patient including but not limited to the risks of nonoperative treatment, versus surgical intervention including infection, bleeding, nerve injury,  blood clots, cardiopulmonary complications, morbidity, mortality, among others, and they were willing to proceed.   Cheral Almas, MD   12/03/2015 7:41 AM

## 2015-12-03 NOTE — Progress Notes (Signed)
Dr. Glade Stanford in to see pt, aware of history of syncope and abnormal EKG, no new orders given.

## 2015-12-04 ENCOUNTER — Encounter (HOSPITAL_COMMUNITY): Payer: Self-pay | Admitting: Orthopaedic Surgery

## 2015-12-04 ENCOUNTER — Telehealth (HOSPITAL_BASED_OUTPATIENT_CLINIC_OR_DEPARTMENT_OTHER): Payer: Self-pay | Admitting: Emergency Medicine

## 2015-12-04 DIAGNOSIS — M7022 Olecranon bursitis, left elbow: Secondary | ICD-10-CM | POA: Diagnosis not present

## 2015-12-04 MED ORDER — MUPIROCIN 2 % EX OINT: 1.0000 "application " | TOPICAL_OINTMENT | Freq: Every day | CUTANEOUS | Status: DC

## 2015-12-04 MED ORDER — HYDROCODONE-ACETAMINOPHEN 5-325 MG PO TABS
1.0000 | ORAL_TABLET | ORAL | Status: DC | PRN
  Administered 2015-12-04 (×2): 2 via ORAL
  Filled 2015-12-04 (×2): qty 2

## 2015-12-04 NOTE — Discharge Instructions (Signed)
1. Change dressing daily and apply mupirocin/bactroban ointment to incision and wound twice a day 2. Keep elbow out straight as much as possible 3. Take oral antibiotics

## 2015-12-04 NOTE — Evaluation (Signed)
Occupational Therapy Evaluation Patient Details Name: KALENNA MILLETT MRN: [id number] DOB: 1991-10-17 Today's Date: 12/04/2015    History of Present Illness Pt is a 25 y.o. female s/p IRRIGATION AND DEBRIDEMENT LEFT ELBOW (Left).   Clinical Impression   Pt reports she was independent with ADLs and mobility PTA. Currently pt overall mod I-supervision for ADLs and functional mobility with the exception of min assist for UB ADLs. Educated on edema management strategies; pt verbalized understanding. Pt planning to d/c home with intermittent supervision from her boyfriend. Pt ready to d/c from OT standpoint; signing off at this time. Thank you for this referral.     Follow Up Recommendations  No OT follow up;Supervision - Intermittent    Equipment Recommendations  None recommended by OT    Recommendations for Other Services       Precautions / Restrictions Precautions Precautions: None Restrictions Weight Bearing Restrictions: No      Mobility Bed Mobility Overal bed mobility: Modified Independent                Transfers Overall transfer level: Modified independent Equipment used: None             General transfer comment: Pt reports slight dizziness with sit to stand; educated on standing next to bed/chair for a few seconds before mobility for head to clear.    Balance Overall balance assessment: No apparent balance deficits (not formally assessed)                                          ADL Overall ADL's : Needs assistance/impaired Eating/Feeding: Independent;Sitting   Grooming: Modified independent;Standing   Upper Body Bathing: Minimal assitance;Standing   Lower Body Bathing: Supervison/ safety;Sit to/from stand   Upper Body Dressing : Minimal assistance;Sitting Upper Body Dressing Details (indicate cue type and reason): Educated on UB dressing technique; pt able to return demo. Lower Body Dressing: Modified independent;Sit to/from  stand   Toilet Transfer: Modified Independent;Ambulation;Regular Toilet   Toileting- Clothing Manipulation and Hygiene: Modified independent;Sit to/from stand   Tub/ Shower Transfer: Supervision/safety;Tub transfer;Ambulation   Functional mobility during ADLs: Modified independent General ADL Comments: Pts boyfriend present for OT eval. Educated on ice, elevation and finger ROM for edema; pt verbalized understanding.     Vision     Perception     Praxis      Pertinent Vitals/Pain Pain Assessment: 0-10 Pain Score: 8  Pain Location: L elbow Pain Descriptors / Indicators: Aching;Sore Pain Intervention(s): Limited activity within patient's tolerance;Monitored during session;Repositioned     Hand Dominance Right   Extremity/Trunk Assessment Upper Extremity Assessment Upper Extremity Assessment: RUE deficits/detail RUE Deficits / Details: Finger AROM decreased secondary to edema.   Lower Extremity Assessment Lower Extremity Assessment: Overall WFL for tasks assessed   Cervical / Trunk Assessment Cervical / Trunk Assessment: Normal   Communication Communication Communication: No difficulties   Cognition Arousal/Alertness: Awake/alert Behavior During Therapy: WFL for tasks assessed/performed Overall Cognitive Status: Within Functional Limits for tasks assessed                     General Comments       Exercises       Shoulder Instructions      Home Living Family/patient expects to be discharged to:: Private residence Living Arrangements: Alone Available Help at Discharge: Friend(s);Available PRN/intermittently (boyfriend will be in and out but  can assist as needed) Type of Home: Apartment Home Access: Stairs to enter Entrance Stairs-Number of Steps: 5   Home Layout: Two level;Bed/bath upstairs Alternate Level Stairs-Number of Steps: flight   Bathroom Shower/Tub: Tub/shower unit Shower/tub characteristics: Engineer, building services: Standard      Home Equipment: None          Prior Functioning/Environment Level of Independence: Independent             OT Diagnosis: Acute pain   OT Problem List:     OT Treatment/Interventions:      OT Goals(Current goals can be found in the care plan section) Acute Rehab OT Goals Patient Stated Goal: to go home today OT Goal Formulation: All assessment and education complete, DC therapy  OT Frequency:     Barriers to D/C:            Co-evaluation              End of Session    Activity Tolerance: Patient tolerated treatment well Patient left: in bed;with call bell/phone within reach;with family/visitor present   Time: 0815-0829 OT Time Calculation (min): 14 min Charges:  OT General Charges $OT Visit: 1 Procedure OT Evaluation $OT Eval Low Complexity: 1 Procedure G-Codes: OT G-codes **NOT FOR INPATIENT CLASS** Functional Assessment Tool Used: Clinical judgement Functional Limitation: Self care Self Care Current Status (B1478): At least 1 percent but less than 20 percent impaired, limited or restricted Self Care Goal Status (G9562): At least 1 percent but less than 20 percent impaired, limited or restricted Self Care Discharge Status [phone]): At least 1 percent but less than 20 percent impaired, limited or restricted   Gaye Alken M.S., OTR/L Pager: [phone]  12/04/2015, 8:38 AM

## 2015-12-04 NOTE — OR Nursing (Signed)
vicodin 5-325 wasted with Irving Burton RN after pill fell to floor.

## 2015-12-04 NOTE — Discharge Summary (Signed)
Physician Discharge Summary      Patient ID: Rachael Oliver MRN: [id number] DOB/AGE: 05/29/1991 25 y.o.  Admit date: 12/03/2015 Discharge date: 12/04/2015  Admission Diagnoses:  <principal problem not specified>  Discharge Diagnoses:  Active Problems:   Septic olecranon bursitis of left elbow   Past Medical History  Diagnosis Date  . Asthma     as a child  . Syncope   . Infection     left elbow    Surgeries: Procedure(s): IRRIGATION AND DEBRIDEMENT LEFT ELBOW on 12/03/2015   Consultants (if any):    Discharged Condition: Improved  Hospital Course: Rachael Oliver is an 25 y.o. female who was admitted 12/03/2015 with a diagnosis of <principal problem not specified> and went to the operating room on 12/03/2015 and underwent the above named procedures.    She was given perioperative antibiotics:  Anti-infectives    Start     Dose/Rate Route Frequency Ordered Stop   12/03/15 2200  sulfamethoxazole-trimethoprim (BACTRIM DS,SEPTRA DS) 800-160 MG per tablet 1 tablet     1 tablet Oral 2 times daily 12/03/15 1933     12/03/15 0000  sulfamethoxazole-trimethoprim (BACTRIM DS) 800-160 MG tablet     1 tablet Oral 2 times daily 12/03/15 1555      .  She was given sequential compression devices, early ambulation for DVT prophylaxis.  She had no signs of clinical worsening.  She benefited maximally from the hospital stay and there were no complications.    Recent vital signs:  Filed Vitals:   12/03/15 1955 12/04/15 0159  BP: 103/56 99/50  Pulse: 64 67  Temp:  98.4 F (36.9 C)  Resp: 18 18    Recent laboratory studies:  Lab Results  Component Value Date   HGB 12.2 12/01/2015   HGB 11.7* 04/29/2015   HGB 13.3 11/23/2014   Lab Results  Component Value Date   WBC 7.5 12/01/2015   PLT 228 12/01/2015   No results found for: INR Lab Results  Component Value Date   NA 140 12/01/2015   K 3.9 12/01/2015   CL 103 12/01/2015   CO2 24 12/01/2015   BUN 12 12/01/2015   CREATININE 0.99 12/01/2015   GLUCOSE 92 12/01/2015    Discharge Medications:     Medication List    STOP taking these medications        cephALEXin 500 MG capsule  Commonly known as:  KEFLEX      TAKE these medications        fluconazole 150 MG tablet  Commonly known as:  DIFLUCAN  Take 1 tablet (150 mg total) by mouth once.     HYDROcodone-acetaminophen 5-325 MG tablet  Commonly known as:  NORCO  Take 1-2 tablets by mouth every 6 (six) hours as needed.     ibuprofen 800 MG tablet  Commonly known as:  ADVIL,MOTRIN  Take 1 tablet (800 mg total) by mouth every 8 (eight) hours as needed for mild pain.     mupirocin ointment 2 %  Commonly known as:  BACTROBAN  Apply 1 application topically daily.     oxyCODONE-acetaminophen 5-325 MG tablet  Commonly known as:  PERCOCET/ROXICET  Take 1-2 tablets by mouth every 6 (six) hours as needed.     sulfamethoxazole-trimethoprim 800-160 MG tablet  Commonly known as:  BACTRIM DS,SEPTRA DS  Take 1 tablet by mouth 2 (two) times daily.     sulfamethoxazole-trimethoprim 800-160 MG tablet  Commonly known as:  BACTRIM DS  Take 1 tablet  by mouth 2 (two) times daily.     tinidazole 500 MG tablet  Commonly known as:  TINDAMAX  Take 2 tablets (1,000 mg total) by mouth daily with breakfast.        Diagnostic Studies: Dg Elbow Complete Left  12/10/15  CLINICAL DATA:  Left elbow pain and swelling, no known injury, initial encounter EXAM: LEFT ELBOW - COMPLETE 3+ VIEW COMPARISON:  None. FINDINGS: No acute fracture or dislocation is noted. Mild soft tissue swelling is noted over the olecranon process. No significant joint effusion is seen. IMPRESSION: Mild soft tissue swelling without acute bony abnormality. Electronically Signed   By: Alcide Clever M.D.   On: 2015-12-10 08:09    Disposition: 01-Home or Self Care      Discharge Instructions    Call MD / Call 911    Complete by:  As directed   If you experience chest pain or shortness of  breath, CALL 911 and be transported to the hospital emergency room.  If you develope a fever above 101.5 F, pus (white drainage) or increased drainage or redness at the wound, or calf pain, call your surgeon's office.     Constipation Prevention    Complete by:  As directed   Drink plenty of fluids.  Prune juice may be helpful.  You may use a stool softener, such as Colace (over the counter) 100 mg twice a day.  Use MiraLax (over the counter) for constipation as needed.     Diet - low sodium heart healthy    Complete by:  As directed      Diet general    Complete by:  As directed      Driving restrictions    Complete by:  As directed   No driving while taking narcotic pain meds.     Increase activity slowly as tolerated    Complete by:  As directed            Follow-up Information    Follow up with Cheral Almas, MD In 1 week.   Specialty:  Orthopedic Surgery   Why:  For wound re-check   Contact information:   [address] [place] [address] [phone]        Signed: Cheral Almas 12/04/2015, 1:31 PM

## 2015-12-04 NOTE — Progress Notes (Signed)
Pt being discharged home via wheelchair with family. Pt alert and oriented x4. VSS. Pt c/o no pain at this time. No signs of respiratory distress. Education complete and care plans resolved. IV removed with catheter intact and pt tolerated well. MD removed drain in elbow prior to discharge with no complications. No further issues at this time. Pt to follow up with PCP. Jillyn Hidden, RN

## 2015-12-04 NOTE — Progress Notes (Signed)
PT Cancellation Note  Patient Details Name: NAILANI FULL MRN: [id number] DOB: [birth year]   Cancelled Treatment:    Reason Eval/Treat Not Completed: OT screened, no needs identified, will sign off.  OT screened for PT needs, no needs identified.  Pt feels comfortable with doing stair for home access and agreeable to PT signing off.  See OT eval for mobility details.  Thanks,    Rollene Rotunda. Marveline Profeta, PT, DPT [phone]   12/04/2015, 11:27 AM

## 2015-12-04 NOTE — Progress Notes (Signed)
   Subjective:  Patient reports pain as moderate.   Objective:   VITALS:   Filed Vitals:   12/03/15 1845 12/03/15 1900 12/03/15 1955 12/04/15 0159  BP:   103/56 99/50  Pulse: 66 93 64 67  Temp:  98.4 F (36.9 C)  98.4 F (36.9 C)  TempSrc:    Oral  Resp:   18 18  Weight:      SpO2: 100% 100% 99% 99%    Incision c/d/i No signs of worsening NVI Postop swelling   Lab Results  Component Value Date   WBC 7.5 12/01/2015   HGB 12.2 12/01/2015   HCT 37.5 12/01/2015   MCV 81.7 12/01/2015   PLT 228 12/01/2015     Assessment/Plan:  1 Day Post-Op   - penrose removed - mupirocin ointment daily - bactrim DS BID x 14 days - home today  Cheral Almas 12/04/2015, 10:16 AM [phone]

## 2015-12-06 LAB — CULTURE, ROUTINE-ABSCESS

## 2015-12-08 LAB — ANAEROBIC CULTURE

## 2015-12-13 ENCOUNTER — Encounter (HOSPITAL_COMMUNITY): Payer: Self-pay | Admitting: *Deleted

## 2015-12-13 ENCOUNTER — Emergency Department (HOSPITAL_COMMUNITY)
Admission: EM | Admit: 2015-12-13 | Discharge: 2015-12-13 | Disposition: A | Discharge status: Home / Self Care | Payer: Medicaid Other | Attending: Emergency Medicine | Admitting: Emergency Medicine

## 2015-12-13 DIAGNOSIS — Z792 Long term (current) use of antibiotics: Secondary | ICD-10-CM | POA: Diagnosis not present

## 2015-12-13 DIAGNOSIS — Z8619 Personal history of other infectious and parasitic diseases: Secondary | ICD-10-CM | POA: Insufficient documentation

## 2015-12-13 DIAGNOSIS — T8131XA Disruption of external operation (surgical) wound, not elsewhere classified, initial encounter: Secondary | ICD-10-CM

## 2015-12-13 DIAGNOSIS — Y658 Other specified misadventures during surgical and medical care: Secondary | ICD-10-CM | POA: Insufficient documentation

## 2015-12-13 DIAGNOSIS — J45909 Unspecified asthma, uncomplicated: Secondary | ICD-10-CM | POA: Diagnosis not present

## 2015-12-13 DIAGNOSIS — Z4802 Encounter for removal of sutures: Secondary | ICD-10-CM | POA: Diagnosis present

## 2015-12-13 NOTE — ED Notes (Signed)
Pt presents to day to have surgical site assessed. The surgical site to Lt elbow open . Wound bed is beefy red .

## 2015-12-13 NOTE — Discharge Instructions (Signed)
Apply wet to dry dressings. Follow up tomorrow with Dr. Roda Shutters.  Wound Dehiscence Wound dehiscence is when a surgical cut (incision) breaks open and does not heal properly after surgery. It usually happens 7-10 days after surgery. This can be a serious condition. It is important to identify and treat this condition early.  CAUSES  Some common causes of wound dehiscence include:  Stretching of the wound area. This may be caused by lifting, vomiting, violent coughing, or straining during bowel movements.  Wound infection.  Early stitch (suture) removal. RISK FACTORS Various things can increase your risk of developing wound dehiscence, including:  Obesity.  Lung disease.  Smoking.  Poor nutrition.  Contamination during surgery. SIGNS AND SYMPTOMS  Bleeding from the wound.  Pain.  Fever.  Wound starts breaking open. DIAGNOSIS  Your health care provider may diagnose wound dehiscence by monitoring the incision and noting any changes in the wound. These changes can include an increase in drainage or pain. The health care provider may also ask you if you have noticed any stretching or tearing of the wound.  Wound cultures may be taken to determine if there is an infection.  Imaging studies, such as an MRI scan or CT scan, may be done to determine if there is a collection of pus or fluid in the wound area. TREATMENT Treatment may include:  Wound care.  Surgical repair.  Antibiotic medicine to treat or prevent infection.  Medicines to reduce pain and swelling. HOME CARE INSTRUCTIONS   Only take over-the-counter or prescription medicines for pain, discomfort, or fever as directed by your health care provider. Taking pain medicine 30 minutes before changing a bandage (dressing) can help relieve pain.  Take your antibiotics as directed. Finish them even if you start to feel better.  Gently wash the area with mild soap and water 2 times a day, or as directed. Rinse off the  soap. Pat the area dry with a clean towel. Do not rub the wound. This may cause bleeding.  Follow your health care provider's instructions for how often you need to change the dressing and packing inside. Wash your hands well before and after changing your dressing. Apply a dressing to the wound as directed.  Take showers. Do not soak the wound, bathe, swim, or use a hot tub until directed by your health care provider.  Avoid exercises that make you sweat heavily.  Use anti-itch medicine as directed by your health care provider. The wound may itch when it is healing. Do not pick or scratch at the wound.  Do not lift more than 10 pounds (4.5 kg) until the wound is healed, or as directed by your health care provider.  Keep all follow-up appointments as directed. SEEK MEDICAL CARE IF:  You have excessive bleeding from your surgical wound.  Your wound does not seem to be healing properly.  You have a fever. SEEK IMMEDIATE MEDICAL CARE IF:   You have increased swelling or redness around the wound.  You have increasing pain in the wound.  You have an increasing amount of pus coming from the wound.  Your wound breaks open farther. MAKE SURE YOU:   Understand these instructions.  Will watch your condition.  Will get help right away if you are not doing well or get worse.   This information is not intended to replace advice given to you by your health care provider. Make sure you discuss any questions you have with your health care provider.   Document  Released: 01/14/2004 Document Revised: 10/29/2013 Document Reviewed: 07/01/2013 Elsevier Interactive Patient Education [employer].

## 2015-12-14 ENCOUNTER — Ambulatory Visit: Payer: Medicaid Other

## 2015-12-15 ENCOUNTER — Ambulatory Visit: Payer: Medicaid Other

## 2015-12-16 NOTE — ED Provider Notes (Signed)
CSN: [id number]     Arrival date & time 12/13/15  1725 History   First MD Initiated Contact with Patient 12/13/15 1735     Chief Complaint  Patient presents with  . Suture / Staple Removal     (Consider location/radiation/quality/duration/timing/severity/associated sxs/prior Treatment) HPI   Rachael Oliver is a(n) 25 y.o. female who presents for left elbow surgical wound dehisence. SHe underwent surgery for I and D of L olecranon septic bursa last week, SHe was playing with her chilren when it suddenly popped open. No bleeding. No severe pain.  No weakness or numbness  Past Medical History  Diagnosis Date  . Asthma     as a child  . Syncope   . Infection     left elbow   Past Surgical History  Procedure Laterality Date  . Foot surgery    . Uterine biopsy    . I&d extremity Left 12/03/2015    Procedure: IRRIGATION AND DEBRIDEMENT LEFT ELBOW;  Surgeon: Tarry Kos, MD;  Location: [facility] OR;  Service: Orthopedics;  Laterality: Left;  . Elbow surgery Left    Family History  Problem Relation Age of Onset  . Alcohol abuse Neg Hx   . Arthritis Neg Hx   . Asthma Neg Hx   . Birth defects Neg Hx   . COPD Neg Hx   . Cancer Neg Hx   . Depression Neg Hx   . Diabetes Neg Hx   . Drug abuse Neg Hx   . Early death Neg Hx   . Hearing loss Neg Hx   . Heart disease Neg Hx   . Hyperlipidemia Neg Hx   . Hypertension Neg Hx   . Kidney disease Neg Hx   . Learning disabilities Neg Hx   . Mental illness Neg Hx   . Mental retardation Neg Hx   . Miscarriages / Stillbirths Neg Hx   . Stroke Neg Hx   . Vision loss Neg Hx    Social History  Substance Use Topics  . Smoking status: Never Smoker   . Smokeless tobacco: Never Used  . Alcohol Use: No   OB History    Gravida Para Term Preterm AB TAB SAB Ectopic Multiple Living   Review of Systems  Musculoskeletal: Negative for joint swelling.  Skin: Positive for wound.  Neurological: Negative for weakness and numbness.       Allergies  Review of patient's allergies indicates no known allergies.  Home Medications   Prior to Admission medications   Medication Sig Start Date End Date Taking? Authorizing Provider  fluconazole (DIFLUCAN) 150 MG tablet Take 1 tablet (150 mg total) by mouth once. Patient not taking: Reported on 12/03/2015 10/07/15   Brock Bad, MD  HYDROcodone-acetaminophen [facility]) 5-325 MG tablet Take 1-2 tablets by mouth every 6 (six) hours as needed. 12/03/15   Naiping Donnelly Stager, MD  ibuprofen (ADVIL,MOTRIN) 800 MG tablet Take 1 tablet (800 mg total) by mouth every 8 (eight) hours as needed for mild pain. 12/01/15   Kristen N Ward, DO  mupirocin ointment (BACTROBAN) 2 % Apply 1 application topically daily. 12/04/15   Tarry Kos, MD  oxyCODONE-acetaminophen (PERCOCET/ROXICET) 5-325 MG tablet Take 1-2 tablets by mouth every 6 (six) hours as needed. Patient taking differently: Take 1-2 tablets by mouth every 6 (six) hours as needed for moderate pain.  12/01/15   Kristen N Ward, DO  sulfamethoxazole-trimethoprim (BACTRIM  DS) 800-160 MG tablet Take 1 tablet by mouth 2 (two) times daily. 12/03/15   Tarry Kos, MD  tinidazole (TINDAMAX) 500 MG tablet Take 2 tablets (1,000 mg total) by mouth daily with breakfast. Patient not taking: Reported on 12/03/2015 10/07/15   Brock Bad, MD   BP 113/61 mmHg  Pulse 81  Temp(Src) 98.2 F (36.8 C) (Oral)  Resp 18  SpO2 99%  LMP 06/08/2015 (Approximate) Physical Exam  Constitutional: She is oriented to person, place, and time. She appears well-developed and well-nourished. No distress.  HENT:  Head: Normocephalic and atraumatic.  Eyes: Conjunctivae are normal. No scleral icterus.  Neck: Normal range of motion.  Cardiovascular: Normal rate, regular rhythm and normal heart sounds.  Exam reveals no gallop and no friction rub.   No murmur heard. Pulmonary/Chest: Effort normal and breath sounds normal. No respiratory distress.  Abdominal: Soft. Bowel  sounds are normal. She exhibits no distension and no mass. There is no tenderness. There is no guarding.  Neurological: She is alert and oriented to person, place, and time.  Skin: Skin is warm and dry. She is not diaphoretic.  4 cm surgical incision dehiscence over the left elbow. 4 deep vertical mattress sutures appear firmly in place with wound opened superficially above the sutures.    ED Course  Procedures (including critical care time) Labs Review Labs Reviewed - No data to display  Imaging Review No results found. I have personally reviewed and evaluated these images and lab results as part of my medical decision-making.   EKG Interpretation None      MDM   Final diagnoses:  Wound dehiscence, initial encounter   I spoke with Physician on call for [facility]. Wound cleansed and apply wet to dry dressings. Patient is to folllow up tomorrow morning in the office. Discussed return precautions.    Arthor Captain, PA-C 12/16/15 1543  Arthor Captain, PA-C 01/03/16 1802  Nelva Nay, MD 01/04/16 1346

## 2016-01-11 ENCOUNTER — Ambulatory Visit (INDEPENDENT_AMBULATORY_CARE_PROVIDER_SITE_OTHER): Payer: Medicaid Other | Admitting: Obstetrics

## 2016-01-11 ENCOUNTER — Encounter: Payer: Self-pay | Admitting: Obstetrics

## 2016-01-11 VITALS — BP 111/76 | HR 79 | Temp 98.8°F | Ht 64.0 in | Wt 125.0 lb

## 2016-01-11 DIAGNOSIS — Z3202 Encounter for pregnancy test, result negative: Secondary | ICD-10-CM

## 2016-01-11 DIAGNOSIS — R896 Abnormal cytological findings in specimens from other organs, systems and tissues: Secondary | ICD-10-CM

## 2016-01-11 DIAGNOSIS — Z30013 Encounter for initial prescription of injectable contraceptive: Secondary | ICD-10-CM | POA: Diagnosis not present

## 2016-01-11 DIAGNOSIS — N898 Other specified noninflammatory disorders of vagina: Secondary | ICD-10-CM | POA: Diagnosis not present

## 2016-01-11 DIAGNOSIS — IMO0002 Reserved for concepts with insufficient information to code with codable children: Secondary | ICD-10-CM

## 2016-01-11 MED ORDER — MEDROXYPROGESTERONE ACETATE 150 MG/ML IM SUSP: 150.0000 mg | INTRAMUSCULAR | Status: DC

## 2016-01-12 ENCOUNTER — Encounter: Payer: Self-pay | Admitting: Obstetrics

## 2016-01-12 ENCOUNTER — Ambulatory Visit: Payer: Medicaid Other | Admitting: Obstetrics

## 2016-01-12 ENCOUNTER — Ambulatory Visit (INDEPENDENT_AMBULATORY_CARE_PROVIDER_SITE_OTHER): Payer: Medicaid Other | Admitting: *Deleted

## 2016-01-12 VITALS — BP 101/65 | HR 82 | Wt 127.0 lb

## 2016-01-12 DIAGNOSIS — Z3042 Encounter for surveillance of injectable contraceptive: Secondary | ICD-10-CM

## 2016-01-12 LAB — POCT URINE PREGNANCY: PREG TEST UR: NEGATIVE

## 2016-01-12 MED ORDER — MEDROXYPROGESTERONE ACETATE 150 MG/ML IM SUSP
150.0000 mg | Freq: Once | INTRAMUSCULAR | Status: AC
  Administered 2016-01-12: 150 mg via INTRAMUSCULAR

## 2016-01-12 NOTE — Progress Notes (Signed)
Pt is in office for depo injection.  Pt was seen in office on 01/11/16 and approved for depo per Dr Clearance CootsHarper.   Pt was made aware of depo policy and states understanding.  Pt tolerated injection well.   Pt advised to RTO on 04-04-16 for next injection.  Administrations This Visit    medroxyPROGESTERone (DEPO-PROVERA) injection 150 mg    Admin Date Action Dose Route Administered By         01/12/2016 Given 150 mg Intramuscular Lanney GinsSuzanne D Izzabell Klasen, CMA

## 2016-01-12 NOTE — Progress Notes (Signed)
Patient ID: Rachael Oliver, female   DOB: 27-Jul-1991, 25 y.o.   MRN: [id number]  Chief Complaint  Patient presents with  . Gynecologic Exam    Patient needs wet prep- discharge with odor    HPI Rachael Oliver is a 25 y.o. female.  Vaginal discharge with odor.  Denies vaginal itching or irritation.  HPI  Past Medical History  Diagnosis Date  . Asthma     as a child  . Syncope   . Infection     left elbow    Past Surgical History  Procedure Laterality Date  . Foot surgery    . Uterine biopsy    . I&d extremity Left 12/03/2015    Procedure: IRRIGATION AND DEBRIDEMENT LEFT ELBOW;  Surgeon: Tarry Kos, MD;  Location: [facility] OR;  Service: Orthopedics;  Laterality: Left;  . Elbow surgery Left     Family History  Problem Relation Age of Onset  . Alcohol abuse Neg Hx   . Arthritis Neg Hx   . Asthma Neg Hx   . Birth defects Neg Hx   . COPD Neg Hx   . Cancer Neg Hx   . Depression Neg Hx   . Diabetes Neg Hx   . Drug abuse Neg Hx   . Early death Neg Hx   . Hearing loss Neg Hx   . Heart disease Neg Hx   . Hyperlipidemia Neg Hx   . Hypertension Neg Hx   . Kidney disease Neg Hx   . Learning disabilities Neg Hx   . Mental illness Neg Hx   . Mental retardation Neg Hx   . Miscarriages / Stillbirths Neg Hx   . Stroke Neg Hx   . Vision loss Neg Hx     Social History Social History  Substance Use Topics  . Smoking status: Never Smoker   . Smokeless tobacco: Never Used  . Alcohol Use: No    No Known Allergies  Current Outpatient Prescriptions  Medication Sig Dispense Refill  . medroxyPROGESTERone (DEPO-PROVERA) 150 MG/ML injection Inject 1 mL (150 mg total) into the muscle every 3 (three) months. 1 mL 3   No current facility-administered medications for this visit.    Review of Systems Review of Systems Constitutional: negative for fatigue and weight loss Respiratory: negative for cough and wheezing Cardiovascular: negative for chest pain, fatigue and  palpitations Gastrointestinal: negative for abdominal pain and change in bowel habits Genitourinary: positive for vaginal discharge with odor Integument/breast: negative for nipple discharge Musculoskeletal:negative for myalgias Neurological: negative for gait problems and tremors Behavioral/Psych: negative for abusive relationship, depression Endocrine: negative for temperature intolerance     Blood pressure 111/76, pulse 79, temperature 98.8 F (37.1 C), height  (1.626 m), weight 125 lb (56.7 kg), last menstrual period 01/07/2016, not currently breastfeeding.  Physical Exam Physical Exam           General:  Alert and no distress Abdomen:  normal findings: no organomegaly, soft, non-tender and no hernia  Pelvis:  External genitalia: normal general appearance Urinary system: urethral meatus normal and bladder without fullness, nontender Vaginal: normal without tenderness, induration or masses Cervix: normal appearance Adnexa: normal bimanual exam Uterus: anteverted and non-tender, normal size      Data Reviewed Labs  Assessment     Vaginal discharge.  Contraceptive management.  Wants Depo Provera. H/O HGSIL of cervix.  S/P Cryocautery.    Plan    Wet prep and cultures ordered. Will treat with positive results of  testing. Depo Provera Rx F/U 3 months for pap smear per Dysplasia Protocol after Cryo.  Orders Placed This Encounter  Procedures  . SureSwab, Vaginosis/Vaginitis Plus   Meds ordered this encounter  Medications  . medroxyPROGESTERone (DEPO-PROVERA) 150 MG/ML injection    Sig: Inject 1 mL (150 mg total) into the muscle every 3 (three) months.    Dispense:  1 mL    Refill:  3

## 2016-01-12 NOTE — Addendum Note (Signed)
Addended by: Elby BeckPAUL, JANE F on: 01/12/2016 09:21 AM   Modules accepted: Orders

## 2016-01-15 LAB — SURESWAB, VAGINOSIS/VAGINITIS PLUS
Atopobium vaginae: 5.7 Log (cells/mL)
C. GLABRATA, DNA: NOT DETECTED
C. TROPICALIS, DNA: NOT DETECTED
C. albicans, DNA: NOT DETECTED
C. parapsilosis, DNA: NOT DETECTED
C. trachomatis RNA, TMA: NOT DETECTED
Gardnerella vaginalis: 7.3 Log (cells/mL)
LACTOBACILLUS SPECIES: NOT DETECTED Log (cells/mL)
MEGASPHAERA SPECIES: 7.8 Log (cells/mL)
N. GONORRHOEAE RNA, TMA: NOT DETECTED
T. vaginalis RNA, QL TMA: NOT DETECTED

## 2016-01-16 ENCOUNTER — Other Ambulatory Visit: Payer: Self-pay | Admitting: Obstetrics

## 2016-01-16 DIAGNOSIS — N760 Acute vaginitis: Secondary | ICD-10-CM

## 2016-01-16 DIAGNOSIS — B9689 Other specified bacterial agents as the cause of diseases classified elsewhere: Secondary | ICD-10-CM

## 2016-01-16 MED ORDER — METRONIDAZOLE 500 MG PO TABS: 500.0000 mg | ORAL_TABLET | Freq: Two times a day (BID) | ORAL | Status: DC

## 2016-02-03 ENCOUNTER — Ambulatory Visit: Payer: Medicaid Other | Admitting: Obstetrics

## 2016-03-29 ENCOUNTER — Emergency Department (HOSPITAL_COMMUNITY)
Admission: EM | Admit: 2016-03-29 | Discharge: 2016-03-29 | Disposition: A | Discharge status: Home / Self Care | Payer: Medicaid Other | Attending: Emergency Medicine | Admitting: Emergency Medicine

## 2016-03-29 ENCOUNTER — Encounter (HOSPITAL_COMMUNITY): Payer: Self-pay | Admitting: Adult Health

## 2016-03-29 DIAGNOSIS — K0889 Other specified disorders of teeth and supporting structures: Secondary | ICD-10-CM | POA: Diagnosis present

## 2016-03-29 DIAGNOSIS — K047 Periapical abscess without sinus: Secondary | ICD-10-CM | POA: Diagnosis not present

## 2016-03-29 DIAGNOSIS — J45909 Unspecified asthma, uncomplicated: Secondary | ICD-10-CM | POA: Diagnosis not present

## 2016-03-29 DIAGNOSIS — R590 Localized enlarged lymph nodes: Secondary | ICD-10-CM | POA: Insufficient documentation

## 2016-03-29 DIAGNOSIS — Z792 Long term (current) use of antibiotics: Secondary | ICD-10-CM | POA: Insufficient documentation

## 2016-03-29 DIAGNOSIS — Z8619 Personal history of other infectious and parasitic diseases: Secondary | ICD-10-CM | POA: Insufficient documentation

## 2016-03-29 DIAGNOSIS — K029 Dental caries, unspecified: Secondary | ICD-10-CM

## 2016-03-29 MED ORDER — AMOXICILLIN 500 MG PO CAPS: 500.0000 mg | ORAL_CAPSULE | Freq: Three times a day (TID) | ORAL | Status: DC

## 2016-03-29 MED ORDER — HYDROCODONE-ACETAMINOPHEN 5-325 MG PO TABS
1.0000 | ORAL_TABLET | Freq: Once | ORAL | Status: AC
  Administered 2016-03-29: 1 via ORAL
  Filled 2016-03-29: qty 1

## 2016-03-29 MED ORDER — AMOXICILLIN 500 MG PO CAPS
500.0000 mg | ORAL_CAPSULE | Freq: Once | ORAL | Status: AC
  Administered 2016-03-29: 500 mg via ORAL
  Filled 2016-03-29: qty 1

## 2016-03-29 MED ORDER — NAPROXEN 500 MG PO TABS: 500.0000 mg | ORAL_TABLET | Freq: Two times a day (BID) | ORAL | Status: DC

## 2016-03-29 NOTE — ED Notes (Signed)
Presents with lower dental pain on the right side began a few days but today is worse.

## 2016-03-29 NOTE — Discharge Instructions (Signed)
Call Dr. Kelly Splinter office in the morning and tell them you were evaluated at [facility] ED and referred there.   Dental Abscess A dental abscess is a collection of pus in or around a tooth. CAUSES This condition is caused by a bacterial infection around the root of the tooth that involves the inner part of the tooth (pulp). It may result from:  Severe tooth decay.  Trauma to the tooth that allows bacteria to enter into the pulp, such as a broken or chipped tooth.  Severe gum disease around a tooth. SYMPTOMS Symptoms of this condition include:  Severe pain in and around the infected tooth.  Swelling and redness around the infected tooth, in the mouth, or in the face.  Tenderness.  Pus drainage.  Bad breath.  Bitter taste in the mouth.  Difficulty swallowing.  Difficulty opening the mouth.  Nausea.  Vomiting.  Chills.  Swollen neck glands.  Fever. DIAGNOSIS This condition is diagnosed with examination of the infected tooth. During the exam, your dentist may tap on the infected tooth. Your dentist will also ask about your medical and dental history and may order X-rays. TREATMENT This condition is treated by eliminating the infection. This may be done with:  Antibiotic medicine.  A root canal. This may be performed to save the tooth.  Pulling (extracting) the tooth. This may also involve draining the abscess. This is done if the tooth cannot be saved. HOME CARE INSTRUCTIONS  Take medicines only as directed by your dentist.  If you were prescribed antibiotic medicine, finish all of it even if you start to feel better.  Rinse your mouth (gargle) often with salt water to relieve pain or swelling.  Do not drive or operate heavy machinery while taking pain medicine.  Do not apply heat to the outside of your mouth.  Keep all follow-up visits as directed by your dentist. This is important. SEEK MEDICAL CARE IF:  Your pain is worse and is not helped by  medicine. SEEK IMMEDIATE MEDICAL CARE IF:  You have a fever or chills.  Your symptoms suddenly get worse.  You have a very bad headache.  You have problems breathing or swallowing.  You have trouble opening your mouth.  You have swelling in your neck or around your eye.   This information is not intended to replace advice given to you by your health care provider. Make sure you discuss any questions you have with your health care provider.   Document Released: 10/24/2005 Document Revised: 03/10/2015 Document Reviewed: 10/21/2014 Elsevier Interactive Patient Education 2016 Elsevier Inc.  Dental Caries Dental caries (also called tooth decay) is the most common oral disease. It can occur at any age but is more common in children and young adults.  HOW DENTAL CARIES DEVELOPS  The process of decay begins when bacteria and foods (particularly sugars and starches) combine in your mouth to produce plaque. Plaque is a substance that sticks to the hard, outer surface of a tooth (enamel). The bacteria in plaque produce acids that attack enamel. These acids may also attack the root surface of a tooth (cementum) if it is exposed. Repeated attacks dissolve these surfaces and create holes in the tooth (cavities). If left untreated, the acids destroy the other layers of the tooth.  RISK FACTORS  Frequent sipping of sugary beverages.   Frequent snacking on sugary and starchy foods, especially those that easily get stuck in the teeth.   Poor oral hygiene.   Dry mouth.  Substance abuse such as methamphetamine abuse.   Broken or poor-fitting dental restorations.   Eating disorders.   Gastroesophageal reflux disease (GERD).   Certain radiation treatments to the head and neck. SYMPTOMS In the early stages of dental caries, symptoms are seldom present. Sometimes white, chalky areas may be seen on the enamel or other tooth layers. In later stages, symptoms may include:  Pits and holes  on the enamel.  Toothache after sweet, hot, or cold foods or drinks are consumed.  Pain around the tooth.  Swelling around the tooth. DIAGNOSIS  Most of the time, dental caries is detected during a regular dental checkup. A diagnosis is made after a thorough medical and dental history is taken and the surfaces of your teeth are checked for signs of dental caries. Sometimes special instruments, such as lasers, are used to check for dental caries. Dental X-ray exams may be taken so that areas not visible to the eye (such as between the contact areas of the teeth) can be checked for cavities.  TREATMENT  If dental caries is in its early stages, it may be reversed with a fluoride treatment or an application of a remineralizing agent at the dental office. Thorough brushing and flossing at home is needed to aid these treatments. If it is in its later stages, treatment depends on the location and extent of tooth destruction:   If a small area of the tooth has been destroyed, the destroyed area will be removed and cavities will be filled with a material such as gold, silver amalgam, or composite resin.   If a large area of the tooth has been destroyed, the destroyed area will be removed and a cap (crown) will be fitted over the remaining tooth structure.   If the center part of the tooth (pulp) is affected, a procedure called a root canal will be needed before a filling or crown can be placed.   If most of the tooth has been destroyed, the tooth may need to be pulled (extracted). HOME CARE INSTRUCTIONS You can prevent, stop, or reverse dental caries at home by practicing good oral hygiene. Good oral hygiene includes:  Thoroughly cleaning your teeth at least twice a day with a toothbrush and dental floss.   Using a fluoride toothpaste. A fluoride mouth rinse may also be used if recommended by your dentist or health care provider.   Restricting the amount of sugary and starchy foods and sugary  liquids you consume.   Avoiding frequent snacking on these foods and sipping of these liquids.   Keeping regular visits with a dentist for checkups and cleanings. PREVENTION   Practice good oral hygiene.  Consider a dental sealant. A dental sealant is a coating material that is applied by your dentist to the pits and grooves of teeth. The sealant prevents food from being trapped in them. It may protect the teeth for several years.  Ask about fluoride supplements if you live in a community without fluorinated water or with water that has a low fluoride content. Use fluoride supplements as directed by your dentist or health care provider.  Allow fluoride varnish applications to teeth if directed by your dentist or health care provider.   This information is not intended to replace advice given to you by your health care provider. Make sure you discuss any questions you have with your health care provider.   Document Released: 07/16/2002 Document Revised: 11/14/2014 Document Reviewed: 10/26/2012 Elsevier Interactive Patient Education [employer]2016 Elsevier Inc.

## 2016-03-29 NOTE — ED Notes (Signed)
Pt is in stable condition upon d/c and ambulates from ED. 

## 2016-03-29 NOTE — ED Provider Notes (Signed)
CSN: [id number]     Arrival date & time 03/29/16  1800 History  By signing my name below, I, Rachael Oliver, attest that this documentation has been prepared under the direction and in the presence of Kerrie Buffalo, NP. Electronically Signed: [name], ED Scribe. 03/29/2016. 7:04 PM.   Chief Complaint  Patient presents with  . Dental Pain    Patient is a 25 y.o. female presenting with tooth pain. The history is provided by the patient. No language interpreter was used.  Dental Pain Location:  Upper Upper teeth location:  2/RU 2nd molar and 1/RU 3rd molar Quality:  Radiating Severity:  Moderate Onset quality:  Gradual Duration:  3 days Timing:  Constant Progression:  Unchanged Chronicity:  New Associated symptoms: facial pain   Associated symptoms: no fever    HPI Comments: Rachael Oliver is a 25 y.o. female who presents to the Emergency Department complaining of gradual onset, constant, right upper dental pain x 3 days. Pt notes that pain radiates from upper right side of jaw to ear. She does not have a dentist. Pt denies having similar symptoms in the past. Denies fever or chills.    Past Medical History  Diagnosis Date  . Asthma     as a child  . Syncope   . Infection     left elbow   Past Surgical History  Procedure Laterality Date  . Foot surgery    . Uterine biopsy    . I&d extremity Left 12/03/2015    Procedure: IRRIGATION AND DEBRIDEMENT LEFT ELBOW;  Surgeon: Tarry Kos, MD;  Location: [facility] OR;  Service: Orthopedics;  Laterality: Left;  . Elbow surgery Left    Family History  Problem Relation Age of Onset  . Alcohol abuse Neg Hx   . Arthritis Neg Hx   . Asthma Neg Hx   . Birth defects Neg Hx   . COPD Neg Hx   . Cancer Neg Hx   . Depression Neg Hx   . Diabetes Neg Hx   . Drug abuse Neg Hx   . Early death Neg Hx   . Hearing loss Neg Hx   . Heart disease Neg Hx   . Hyperlipidemia Neg Hx   . Hypertension Neg Hx   . Kidney disease Neg Hx   . Learning  disabilities Neg Hx   . Mental illness Neg Hx   . Mental retardation Neg Hx   . Miscarriages / Stillbirths Neg Hx   . Stroke Neg Hx   . Vision loss Neg Hx    Social History  Substance Use Topics  . Smoking status: Never Smoker   . Smokeless tobacco: Never Used  . Alcohol Use: No   OB History    Gravida Para Term Preterm AB TAB SAB Ectopic Multiple Living   Review of Systems  Constitutional: Negative for fever and chills.  HENT: Positive for dental problem and ear pain.   All other systems reviewed and are negative.  Allergies  Review of patient's allergies indicates no known allergies.  Home Medications   Prior to Admission medications   Medication Sig Start Date End Date Taking? Authorizing Provider  amoxicillin (AMOXIL) 500 MG capsule Take 1 capsule (500 mg total) by mouth 3 (three) times daily. 03/29/16   Hope Orlene Och, NP  medroxyPROGESTERone (DEPO-PROVERA) 150 MG/ML injection Inject 1 mL (150 mg total) into the muscle every 3 (three)  months. 01/11/16   Brock Badharles A Harper, MD  metroNIDAZOLE (FLAGYL) 500 MG tablet Take 1 tablet (500 mg total) by mouth 2 (two) times daily. 01/16/16   Brock Badharles A Harper, MD  naproxen (NAPROSYN) 500 MG tablet Take 1 tablet (500 mg total) by mouth 2 (two) times daily. 03/29/16   Hope Orlene OchM Neese, NP   BP 120/70 mmHg  Pulse 86  Temp(Src) 98.3 F (36.8 C) (Oral)  Resp 18  SpO2 100% Physical Exam  Constitutional: She appears well-developed and well-nourished. No distress.  HENT:  Head: Normocephalic and atraumatic.  Right Ear: Tympanic membrane normal.  Left Ear: Tympanic membrane normal.  3rd right upper molar decayed to gumline. Swelling and erythema surrounding gumline. 2nd right upper molar decayed to gumline with TTP.   Eyes: Conjunctivae are normal.  Neck: Neck supple.  Cardiovascular: Normal rate, regular rhythm and normal heart sounds.   Pulmonary/Chest: Effort normal and breath sounds normal. No respiratory distress. She  has no wheezes. She has no rales.  Abdominal:  No CVA tenderness  Lymphadenopathy:    She has cervical adenopathy.  Neurological: She is alert.  Skin: Skin is warm and dry. She is not diaphoretic.  Nursing note and vitals reviewed.   ED Course  Procedures  DIAGNOSTIC STUDIES: Oxygen Saturation is 100% on RA, normal by my interpretation.  COORDINATION OF CARE: 6:55 PM Discussed treatment plan which includes prescription of antibiotics, medication for pain and inflammation with pt at bedside and pt agreed to plan.   MDM   Final diagnoses:  Dental abscess  Dental caries    Patient with dentalgia.  No abscess requiring immediate incision and drainage.  Exam not concerning for Ludwig's angina or pharyngeal abscess.  Will treat with Amoxicillin and Naprosyn. Pt instructed to follow-up with dentist.  Discussed return precautions. Pt safe for discharge.  I personally performed the services described in this documentation, which was scribed in my presence. The recorded information has been reviewed and is accurate.    Pine LakeHope M Neese, [place]NP 03/29/16 2234  Jerelyn ScottMartha Linker, MD 03/29/16 [phone]

## 2016-04-05 ENCOUNTER — Ambulatory Visit: Payer: Medicaid Other

## 2016-04-07 ENCOUNTER — Ambulatory Visit (INDEPENDENT_AMBULATORY_CARE_PROVIDER_SITE_OTHER): Payer: Medicaid Other | Admitting: *Deleted

## 2016-04-07 VITALS — BP 109/73 | HR 74 | Temp 98.9°F | Wt 114.0 lb

## 2016-04-07 DIAGNOSIS — Z3042 Encounter for surveillance of injectable contraceptive: Secondary | ICD-10-CM

## 2016-04-07 DIAGNOSIS — Z3049 Encounter for surveillance of other contraceptives: Secondary | ICD-10-CM

## 2016-04-07 MED ORDER — MEDROXYPROGESTERONE ACETATE 150 MG/ML IM SUSP
150.0000 mg | Freq: Once | INTRAMUSCULAR | Status: AC
  Administered 2016-04-07: 150 mg via INTRAMUSCULAR

## 2016-04-07 NOTE — Progress Notes (Signed)
Patiente tolerated Depo injection well in [facility]. RTO 06-29-16

## 2016-06-05 ENCOUNTER — Emergency Department (HOSPITAL_COMMUNITY)
Admission: EM | Admit: 2016-06-05 | Discharge: 2016-06-05 | Discharge status: Against Medical Advice | Payer: Medicaid Other | Attending: Emergency Medicine | Admitting: Emergency Medicine

## 2016-06-05 ENCOUNTER — Encounter (HOSPITAL_COMMUNITY): Payer: Self-pay | Admitting: Emergency Medicine

## 2016-06-05 DIAGNOSIS — J45909 Unspecified asthma, uncomplicated: Secondary | ICD-10-CM | POA: Insufficient documentation

## 2016-06-05 DIAGNOSIS — W2111XA Struck by baseball bat, initial encounter: Secondary | ICD-10-CM | POA: Insufficient documentation

## 2016-06-05 DIAGNOSIS — M546 Pain in thoracic spine: Secondary | ICD-10-CM | POA: Diagnosis present

## 2016-06-05 DIAGNOSIS — Y999 Unspecified external cause status: Secondary | ICD-10-CM | POA: Insufficient documentation

## 2016-06-05 DIAGNOSIS — Y929 Unspecified place or not applicable: Secondary | ICD-10-CM | POA: Insufficient documentation

## 2016-06-05 DIAGNOSIS — Z79899 Other long term (current) drug therapy: Secondary | ICD-10-CM | POA: Insufficient documentation

## 2016-06-05 DIAGNOSIS — Y939 Activity, unspecified: Secondary | ICD-10-CM | POA: Insufficient documentation
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
## 2016-06-05 NOTE — ED Notes (Signed)
Explained that X-rays were ordered, given another blanket

## 2016-06-05 NOTE — ED Notes (Signed)
RN was told that x-ray came to get pt however she was not in the room. No staff witnessed her leave the room.

## 2016-06-28 ENCOUNTER — Inpatient Hospital Stay (HOSPITAL_COMMUNITY)
Admission: AD | Admit: 2016-06-28 | Discharge: 2016-06-28 | Disposition: A | Discharge status: Home / Self Care | Payer: Medicaid Other | Source: Ambulatory Visit | Attending: Family Medicine | Admitting: Family Medicine

## 2016-06-28 ENCOUNTER — Ambulatory Visit: Payer: Self-pay | Admitting: Obstetrics

## 2016-06-28 ENCOUNTER — Encounter (HOSPITAL_COMMUNITY): Payer: Self-pay | Admitting: *Deleted

## 2016-06-28 DIAGNOSIS — R102 Pelvic and perineal pain: Secondary | ICD-10-CM | POA: Insufficient documentation

## 2016-06-28 DIAGNOSIS — R1030 Lower abdominal pain, unspecified: Secondary | ICD-10-CM | POA: Diagnosis present

## 2016-06-28 HISTORY — DX: Unspecified abnormal cytological findings in specimens from vagina: R87.629

## 2016-06-28 HISTORY — DX: Personal history of Methicillin resistant Staphylococcus aureus infection: Z86.14

## 2016-06-28 LAB — URINALYSIS, ROUTINE W REFLEX MICROSCOPIC
BILIRUBIN URINE: NEGATIVE
Glucose, UA: NEGATIVE mg/dL
Hgb urine dipstick: NEGATIVE
KETONES UR: NEGATIVE mg/dL
Leukocytes, UA: NEGATIVE
NITRITE: NEGATIVE
PH: 6 (ref 5.0–8.0)
PROTEIN: NEGATIVE mg/dL
Specific Gravity, Urine: 1.015 (ref 1.005–1.030)

## 2016-06-28 LAB — CBC WITH DIFFERENTIAL/PLATELET
BASOS ABS: 0 10*3/uL (ref 0.0–0.1)
Basophils Relative: 1 %
Eosinophils Absolute: 0 10*3/uL (ref 0.0–0.7)
Eosinophils Relative: 1 %
HEMATOCRIT: 38.1 % (ref 36.0–46.0)
Hemoglobin: 12.7 g/dL (ref 12.0–15.0)
LYMPHS PCT: 53 %
Lymphs Abs: 1.9 10*3/uL (ref 0.7–4.0)
MCH: 27 pg (ref 26.0–34.0)
MCHC: 33.3 g/dL (ref 30.0–36.0)
MCV: 81.1 fL (ref 78.0–100.0)
MONO ABS: 0.3 10*3/uL (ref 0.1–1.0)
Monocytes Relative: 8 %
NEUTROS ABS: 1.3 10*3/uL — AB (ref 1.7–7.7)
Neutrophils Relative %: 37 %
Platelets: 204 10*3/uL (ref 150–400)
RBC: 4.7 MIL/uL (ref 3.87–5.11)
RDW: 13.2 % (ref 11.5–15.5)
WBC: 3.5 10*3/uL — AB (ref 4.0–10.5)

## 2016-06-28 LAB — WET PREP, GENITAL
Clue Cells Wet Prep HPF POC: NONE SEEN
SPERM: NONE SEEN
Trich, Wet Prep: NONE SEEN
YEAST WET PREP: NONE SEEN

## 2016-06-28 LAB — POCT PREGNANCY, URINE: Preg Test, Ur: NEGATIVE

## 2016-06-28 NOTE — Discharge Instructions (Signed)

## 2016-06-28 NOTE — MAU Provider Note (Signed)
Chief Complaint:  Abdominal Pain   First Provider Initiated Contact with Patient 06/28/16 1033     HPI: Rachael PereyraKiarra S Oliver is a 25 y.o. [id number] who presents to maternity admissions reporting lower abdominal pain for about a week. Cramping in nature, comes and goes.  Worried it is her "cervix" due to past history of abnormal pap with Cryosurgery 06/19/15.  Worried the pain is "because the bad cells are coming back". Doesn't have periods due to Depo (actually given 04/07/16, due tomorrow).  States wants to stop Depo due to how it makes her feel.. She reports no vaginal bleeding, vaginal itching/burning, urinary symptoms, h/a, dizziness, n/v, or fever/chills.     Abdominal Cramping  This is a new problem. The current episode started in the past 7 days. The onset quality is gradual. The problem occurs intermittently. The problem has been waxing and waning. The pain is located in the LLQ and RLQ. The pain is mild. The quality of the pain is cramping. The abdominal pain does not radiate. Pertinent negatives include no anorexia, constipation, diarrhea, dysuria, fever, frequency, myalgias, nausea or vomiting. Nothing aggravates the pain. The pain is relieved by nothing. She has tried nothing for the symptoms.   RN Note: Pain in lower stomach X 1 week. Last period was last year some time. States was on Depo, last dose 4 months ago. Pain comes and goes.  Cramping. Denies vaginal D/C. Does not think she is pregnant  Past Medical History: Past Medical History:  Diagnosis Date  . Asthma    as a child  . Infection    left elbow  . Syncope     Past obstetric history: OB History  Gravida Para Term Preterm AB Living  3 3 2 1   3   SAB TAB Ectopic Multiple Live Births          2    # Outcome Date GA Lbr Len/2nd Weight Sex Delivery Anes PTL Lv  3 Term 03/15/14 [id number] 02:20 / 00:15 3.52 kg (7 lb 12.2 oz) M Vag-Spont EPI  LIV  2 Term 2014     Vag-Spont     1 Preterm 2011     Vag-Spont   LIV      Past  Surgical History: Past Surgical History:  Procedure Laterality Date  . ELBOW SURGERY Left   . FOOT SURGERY    . I&D EXTREMITY Left 12/03/2015   Procedure: IRRIGATION AND DEBRIDEMENT LEFT ELBOW;  Surgeon: Tarry KosNaiping M Xu, MD;  Location: [facility] OR;  Service: Orthopedics;  Laterality: Left;  . uterine biopsy      Family History: Family History  Problem Relation Age of Onset  . Alcohol abuse Neg Hx   . Arthritis Neg Hx   . Asthma Neg Hx   . Birth defects Neg Hx   . COPD Neg Hx   . Cancer Neg Hx   . Depression Neg Hx   . Diabetes Neg Hx   . Drug abuse Neg Hx   . Early death Neg Hx   . Hearing loss Neg Hx   . Heart disease Neg Hx   . Hyperlipidemia Neg Hx   . Hypertension Neg Hx   . Kidney disease Neg Hx   . Learning disabilities Neg Hx   . Mental illness Neg Hx   . Mental retardation Neg Hx   . Miscarriages / Stillbirths Neg Hx   . Stroke Neg Hx   . Vision loss Neg Hx     Social History: Social  History  Substance Use Topics  . Smoking status: Never Smoker  . Smokeless tobacco: Never Used  . Alcohol use No    Allergies: No Known Allergies  Meds:  Prescriptions Prior to Admission  Medication Sig Dispense Refill Last Dose  . amoxicillin (AMOXIL) 500 MG capsule Take 1 capsule (500 mg total) by mouth 3 (three) times daily. 21 capsule 0 Taking  . medroxyPROGESTERone (DEPO-PROVERA) 150 MG/ML injection Inject 1 mL (150 mg total) into the muscle every 3 (three) months. 1 mL 3 Taking  . metroNIDAZOLE (FLAGYL) 500 MG tablet Take 1 tablet (500 mg total) by mouth 2 (two) times daily. 14 tablet 2 Taking  . naproxen (NAPROSYN) 500 MG tablet Take 1 tablet (500 mg total) by mouth 2 (two) times daily. 30 tablet 0 Taking    I have reviewed patient's Past Medical Hx, Surgical Hx, Family Hx, Social Hx, medications and allergies.  ROS:  Review of Systems  Constitutional: Negative for fever.  Gastrointestinal: Negative for anorexia, constipation, diarrhea, nausea and vomiting.   Genitourinary: Negative for dysuria and frequency.  Musculoskeletal: Negative for myalgias.  Neurological: Negative for dizziness.   Other systems negative     Physical Exam  Patient Vitals for the past 24 hrs:  BP Temp Temp src Pulse Resp Height Weight  06/28/16 1027 105/65 98.9 F (37.2 C) Oral 72 18 - -  06/28/16 1023 - - - - - 5\' 5"  (1.651 m) 58.5 kg (129 lb)   Constitutional: Well-developed, well-nourished female in no acute distress.  Cardiovascular: normal rate and rhythm, no ectopy audible, S1 & S2 heard, no murmur Respiratory: normal effort, no distress. Lungs CTAB with no wheezes or crackles GI: Abd soft, non-tender.  Nondistended.  No rebound, No guarding.  Bowel Sounds audible  MS: Extremities nontender, no edema, normal ROM Neurologic: Alert and oriented x 4.   Grossly nonfocal. GU: Neg CVAT. Skin:  Warm and Dry Psych:  Affect appropriate.  PELVIC EXAM: Cervix pink, visually closed, without lesion, scant white creamy discharge, vaginal walls and external genitalia normal Bimanual exam: Cervix firm, anterior, neg CMT, uterus slightly tender, nonenlarged, adnexa with mild  tenderness, enlargement, or mass    Labs: Results for orders placed or performed during the hospital encounter of 06/28/16 (from the past 24 hour(s))  Pregnancy, urine POC     Status: None   Collection Time: 06/28/16 10:27 AM  Result Value Ref Range   Preg Test, Ur NEGATIVE NEGATIVE      Imaging:  No results found.  MAU Course/MDM: I have ordered labs as follows: see above Imaging ordered: none Results reviewed.   Pt stable at time of discharge.  Assessment: Pelvic Pain of unknown origin Could be dysmenorrhea or bowel pain.  Plan: Discharge home Recommend follow up in GYN office for birth control Rx sent for naproxen for cramps   Encouraged to return here or to other Urgent Care/ED if she develops worsening of symptoms, increase in pain, fever, or other concerning symptoms.    Wynelle BourgeoisMarie Tyshell Ramberg CNM, MSN Certified Nurse-Midwife 06/28/2016 10:33 AM

## 2016-06-28 NOTE — MAU Note (Signed)
Pain in lower stomach X 1 week. Last period was last year some time. States was on Depo, last dose 4 months ago. Pain comes and goes.  Cramping. Denies vaginal D/C. Does not think she is pregnant.

## 2016-06-28 NOTE — MAU Provider Note (Signed)
History  CSN: [id number]  Arrival date and time: 06/28/16 1011   First Provider Initiated Contact with Patient 06/28/16 1033      Chief Complaint  Patient presents with  . Abdominal Pain   HPI: Rachael Oliver is a 25 year old [id number] African American female who presents to the MAU today with the chief complaint of lower abdominal pain.   Abdominal Pain  This is a new problem. The current episode started in the past 7 days. The problem occurs intermittently. The problem has been waxing and waning. The pain is located in the suprapubic region. The pain is at a severity of 8/10. The quality of the pain is cramping. The abdominal pain does not radiate. Pertinent negatives include no constipation, diarrhea, dysuria, fever, frequency, nausea or vomiting. Nothing aggravates the pain. The pain is relieved by nothing. Treatments tried: Alleve. The treatment provided no relief.    She denies nausea, vomiting, diarrhea, fever, loss of appetite, vaginal bleeding or discharge, urinary symptoms and doesn't think she is pregnant. The patient is sexually active and doesn't use protection with her partner.  She declined STD testing.  Past Medical History:  Diagnosis Date  . Asthma    as a child  . History of methicillin resistant staphylococcus aureus (MRSA)   . Infection    left elbow  . Syncope   . Vaginal Pap smear, abnormal     Past Surgical History:  Procedure Laterality Date  . ELBOW SURGERY Left   . FOOT SURGERY    . GYNECOLOGIC CRYOSURGERY    . I&D EXTREMITY Left 12/03/2015   Procedure: IRRIGATION AND DEBRIDEMENT LEFT ELBOW;  Surgeon: Tarry Kos, MD;  Location: [facility] OR;  Service: Orthopedics;  Laterality: Left;  . uterine biopsy      Family History  Problem Relation Age of Onset  . Alcohol abuse Neg Hx   . Arthritis Neg Hx   . Asthma Neg Hx   . Birth defects Neg Hx   . COPD Neg Hx   . Cancer Neg Hx   . Depression Neg Hx   . Diabetes Neg Hx   . Drug abuse Neg Hx   . Early  death Neg Hx   . Hearing loss Neg Hx   . Heart disease Neg Hx   . Hyperlipidemia Neg Hx   . Hypertension Neg Hx   . Kidney disease Neg Hx   . Learning disabilities Neg Hx   . Mental illness Neg Hx   . Mental retardation Neg Hx   . Miscarriages / Stillbirths Neg Hx   . Stroke Neg Hx   . Vision loss Neg Hx     Social History  Substance Use Topics  . Smoking status: Never Smoker  . Smokeless tobacco: Never Used  . Alcohol use No    Allergies: No Known Allergies  Prescriptions Prior to Admission  Medication Sig Dispense Refill Last Dose  . naproxen sodium (ANAPROX) 220 MG tablet Take 440 mg by mouth 2 (two) times daily as needed (pain).   06/27/2016 at Unknown time  . amoxicillin (AMOXIL) 500 MG capsule Take 1 capsule (500 mg total) by mouth 3 (three) times daily. (Patient not taking: Reported on 06/28/2016) 21 capsule 0 Not Taking at Unknown time  . medroxyPROGESTERone (DEPO-PROVERA) 150 MG/ML injection Inject 1 mL (150 mg total) into the muscle every 3 (three) months. 1 mL 3 04/07/2016  . metroNIDAZOLE (FLAGYL) 500 MG tablet Take 1 tablet (500 mg total) by mouth 2 (two)  times daily. (Patient not taking: Reported on 06/28/2016) 14 tablet 2 Completed Course at Unknown time  . naproxen (NAPROSYN) 500 MG tablet Take 1 tablet (500 mg total) by mouth 2 (two) times daily. (Patient not taking: Reported on 06/28/2016) 30 tablet 0 Completed Course at Unknown time    Review of Systems  Constitutional: Negative for chills and fever.  Gastrointestinal: Positive for abdominal pain. Negative for constipation, diarrhea, nausea and vomiting.  Genitourinary: Negative for dysuria, frequency and urgency.   Physical Exam   Blood pressure 105/65, pulse 72, temperature 98.9 F (37.2 C), temperature source Oral, resp. rate 18, height 5\' 5"  (1.651 m), weight 58.5 kg (129 lb).  Physical Exam  Constitutional: She is oriented to person, place, and time. She appears well-developed and well-nourished. No  distress.  HENT:  Head: Normocephalic and atraumatic.  Eyes: EOM are normal.  Cardiovascular: Normal rate, regular rhythm and normal heart sounds.  Exam reveals no gallop and no friction rub.   No murmur heard. Respiratory: Effort normal and breath sounds normal. No respiratory distress. She has no wheezes. She has no rales.  GI: Soft. Bowel sounds are normal. She exhibits distension. There is tenderness. There is no rebound and no guarding.  Genitourinary: Vagina normal. No vaginal discharge found.  Genitourinary Comments: Cervical motion tenderness present on bimanual exam  Neurological: She is alert and oriented to person, place, and time.  Skin: Skin is warm and dry. No rash noted.  Psychiatric: She has a normal mood and affect. Her behavior is normal.   Results for orders placed or performed during the hospital encounter of 06/28/16 (from the past 24 hour(s))  Urinalysis, Routine w reflex microscopic (not at [facility])     Status: None   Collection Time: 06/28/16 10:23 AM  Result Value Ref Range   Color, Urine YELLOW YELLOW   APPearance CLEAR CLEAR   Specific Gravity, Urine 1.015 1.005 - 1.030   pH 6.0 5.0 - 8.0   Glucose, UA NEGATIVE NEGATIVE mg/dL   Hgb urine dipstick NEGATIVE NEGATIVE   Bilirubin Urine NEGATIVE NEGATIVE   Ketones, ur NEGATIVE NEGATIVE mg/dL   Protein, ur NEGATIVE NEGATIVE mg/dL   Nitrite NEGATIVE NEGATIVE   Leukocytes, UA NEGATIVE NEGATIVE  Pregnancy, urine POC     Status: None   Collection Time: 06/28/16 10:27 AM  Result Value Ref Range   Preg Test, Ur NEGATIVE NEGATIVE  Wet prep, genital     Status: Abnormal   Collection Time: 06/28/16 10:55 AM  Result Value Ref Range   Yeast Wet Prep HPF POC NONE SEEN NONE SEEN   Trich, Wet Prep NONE SEEN NONE SEEN   Clue Cells Wet Prep HPF POC NONE SEEN NONE SEEN   WBC, Wet Prep HPF POC MODERATE (A) NONE SEEN   Sperm NONE SEEN   CBC with Differential/Platelet     Status: Abnormal (Preliminary result)   Collection  Time: 06/28/16 11:24 AM  Result Value Ref Range   WBC 3.5 (L) 4.0 - 10.5 K/uL   RBC 4.70 3.87 - 5.11 MIL/uL   Hemoglobin 12.7 12.0 - 15.0 g/dL   HCT 40.938.1 81.136.0 - 91.446.0 %   MCV 81.1 78.0 - 100.0 fL   MCH 27.0 26.0 - 34.0 pg   MCHC 33.3 30.0 - 36.0 g/dL   RDW 78.213.2 95.611.5 - 21.315.5 %   Platelets 204 150 - 400 K/uL   Neutrophils Relative % 37 %   Neutro Abs 1.3 (L) 1.7 - 7.7 K/uL   Lymphocytes Relative  53 %   Lymphs Abs 1.9 0.7 - 4.0 K/uL   Monocytes Relative 8 %   Monocytes Absolute 0.3 0.1 - 1.0 K/uL   Eosinophils Relative 1 %   Eosinophils Absolute 0.0 0.0 - 0.7 K/uL   Basophils Relative 1 %   Basophils Absolute 0.0 0.0 - 0.1 K/uL   Other PENDING %    MAU Course  Procedures  MDM Given the presence of suprapubic pain and cervical motion tenderness on the vaginal exam, the following labs were ordered: CBC with diff, GC/Chlamydia probe, wet prep, urine pregnancy test and UA.  Assessment and Plan  Assessment: History and physical exam were not concerning for acute infectious cause.  UA and CBC w/ diff were normal.  No diagnosis found.    Plan: Discharge home Encouraged to return here or to other Urgent Care/ED if she develops worsening of symptoms, increase in pain, fever, or other concerning symptoms.    Syenna Nazir PA-S 06/28/2016, 11:11 AM

## 2016-06-29 ENCOUNTER — Ambulatory Visit: Payer: Medicaid Other

## 2016-06-29 LAB — GC/CHLAMYDIA PROBE AMP (~~LOC~~) NOT AT ARMC
CHLAMYDIA, DNA PROBE: POSITIVE — AB
NEISSERIA GONORRHEA: NEGATIVE

## 2016-06-29 LAB — HIV ANTIBODY (ROUTINE TESTING W REFLEX): HIV Screen 4th Generation wRfx: NONREACTIVE

## 2016-06-30 ENCOUNTER — Telehealth (HOSPITAL_COMMUNITY): Payer: Self-pay | Admitting: *Deleted

## 2016-06-30 DIAGNOSIS — O98813 Other maternal infectious and parasitic diseases complicating pregnancy, third trimester: Principal | ICD-10-CM

## 2016-06-30 DIAGNOSIS — A749 Chlamydial infection, unspecified: Secondary | ICD-10-CM

## 2016-06-30 MED ORDER — AZITHROMYCIN 500 MG PO TABS: ORAL_TABLET | ORAL | 0 refills | Status: DC

## 2016-06-30 NOTE — Telephone Encounter (Signed)
Telephone call to patient regarding positive chlamydia culture, patient notified.  Rx routed to pharmacy per protocol.  Instructed patient to notify her partner for treatment and to abstain from sex for seven days post treatment.  Report faxed to health department.  

## 2016-07-05 ENCOUNTER — Other Ambulatory Visit: Payer: Self-pay | Admitting: *Deleted

## 2016-07-05 DIAGNOSIS — A749 Chlamydial infection, unspecified: Secondary | ICD-10-CM

## 2016-07-05 DIAGNOSIS — O98813 Other maternal infectious and parasitic diseases complicating pregnancy, third trimester: Principal | ICD-10-CM

## 2016-07-05 MED ORDER — AZITHROMYCIN 500 MG PO TABS: ORAL_TABLET | ORAL | 0 refills | Status: DC

## 2016-07-06 ENCOUNTER — Telehealth: Payer: Self-pay | Admitting: *Deleted

## 2016-07-06 NOTE — Telephone Encounter (Signed)
Erroneous encounter

## 2016-07-27 ENCOUNTER — Encounter: Payer: Self-pay | Admitting: Obstetrics

## 2016-07-27 ENCOUNTER — Ambulatory Visit (INDEPENDENT_AMBULATORY_CARE_PROVIDER_SITE_OTHER): Payer: Medicaid Other | Admitting: Obstetrics

## 2016-07-27 VITALS — BP 114/66 | HR 75 | Temp 98.4°F | Ht 64.0 in | Wt 130.4 lb

## 2016-07-27 DIAGNOSIS — Z3202 Encounter for pregnancy test, result negative: Secondary | ICD-10-CM | POA: Diagnosis not present

## 2016-07-27 DIAGNOSIS — Z0000 Encounter for general adult medical examination without abnormal findings: Secondary | ICD-10-CM

## 2016-07-27 DIAGNOSIS — B9689 Other specified bacterial agents as the cause of diseases classified elsewhere: Secondary | ICD-10-CM

## 2016-07-27 DIAGNOSIS — Z3009 Encounter for other general counseling and advice on contraception: Secondary | ICD-10-CM

## 2016-07-27 DIAGNOSIS — Z01419 Encounter for gynecological examination (general) (routine) without abnormal findings: Secondary | ICD-10-CM

## 2016-07-27 DIAGNOSIS — N760 Acute vaginitis: Secondary | ICD-10-CM

## 2016-07-27 DIAGNOSIS — Z124 Encounter for screening for malignant neoplasm of cervix: Secondary | ICD-10-CM

## 2016-07-27 DIAGNOSIS — N912 Amenorrhea, unspecified: Secondary | ICD-10-CM

## 2016-07-27 LAB — POCT URINE PREGNANCY: Preg Test, Ur: NEGATIVE

## 2016-07-27 MED ORDER — METRONIDAZOLE 500 MG PO TABS: 500.0000 mg | ORAL_TABLET | Freq: Two times a day (BID) | ORAL | 2 refills | Status: DC

## 2016-07-27 NOTE — Progress Notes (Signed)
Subjective:        Rachael Oliver is a 25 y.o. female here for a routine exam.  Current complaints: None.    Personal health questionnaire:  Is patient Ashkenazi Jewish, have a family history of breast and/or ovarian cancer: no Is there a family history of uterine cancer diagnosed at age < 7750, gastrointestinal cancer, urinary tract cancer, family member who is a Personnel officerLynch syndrome-associated carrier: no Is the patient overweight and hypertensive, family history of diabetes, personal history of gestational diabetes, preeclampsia or PCOS: no Is patient over 6855, have PCOS,  family history of premature CHD under age 23, diabetes, smoke, have hypertension or peripheral artery disease:  no At any time, has a partner hit, kicked or otherwise hurt or frightened you?: no Over the past 2 weeks, have you felt down, depressed or hopeless?: no Over the past 2 weeks, have you felt little interest or pleasure in doing things?:no   Gynecologic History No LMP recorded. Patient is not currently having periods (Reason: Needs Pregnancy Test). Contraception: Nexplanon Last Pap: 2015. Results were:  With positive HPV Last mammogram: n/a. Results were: n/a  Obstetric History OB History  Gravida Para Term Preterm AB Living  3 3 2 1   3   SAB TAB Ectopic Multiple Live Births          2    # Outcome Date GA Lbr Len/2nd Weight Sex Delivery Anes PTL Lv  3 Term 03/15/14 349w0d 02:20 / 00:15 7 lb 12.2 oz (3.52 kg) M Vag-Spont EPI  LIV  2 Term 2014     Vag-Spont     1 Preterm 2011     Vag-Spont   LIV      Past Medical History:  Diagnosis Date  . Asthma    as a child  . History of methicillin resistant staphylococcus aureus (MRSA)   . Infection    left elbow  . Syncope   . Vaginal Pap smear, abnormal     Past Surgical History:  Procedure Laterality Date  . ELBOW SURGERY Left   . FOOT SURGERY    . GYNECOLOGIC CRYOSURGERY    . I&D EXTREMITY Left 12/03/2015   Procedure: IRRIGATION AND DEBRIDEMENT LEFT  ELBOW;  Surgeon: Tarry KosNaiping M Xu, MD;  Location: [facility] OR;  Service: Orthopedics;  Laterality: Left;  . uterine biopsy      No current outpatient prescriptions on file. No Known Allergies  Social History  Substance Use Topics  . Smoking status: Never Smoker  . Smokeless tobacco: Never Used  . Alcohol use No    Family History  Problem Relation Age of Onset  . Alcohol abuse Neg Hx   . Arthritis Neg Hx   . Asthma Neg Hx   . Birth defects Neg Hx   . COPD Neg Hx   . Cancer Neg Hx   . Depression Neg Hx   . Diabetes Neg Hx   . Drug abuse Neg Hx   . Early death Neg Hx   . Hearing loss Neg Hx   . Heart disease Neg Hx   . Hyperlipidemia Neg Hx   . Hypertension Neg Hx   . Kidney disease Neg Hx   . Learning disabilities Neg Hx   . Mental illness Neg Hx   . Mental retardation Neg Hx   . Miscarriages / Stillbirths Neg Hx   . Stroke Neg Hx   . Vision loss Neg Hx       Review of Systems  Constitutional:  negative for fatigue and weight loss Respiratory: negative for cough and wheezing Cardiovascular: negative for chest pain, fatigue and palpitations Gastrointestinal: negative for abdominal pain and change in bowel habits Musculoskeletal:negative for myalgias Neurological: negative for gait problems and tremors Behavioral/Psych: negative for abusive relationship, depression Endocrine: negative for temperature intolerance   Genitourinary:negative for abnormal menstrual periods, genital lesions, hot flashes, sexual problems and vaginal discharge Integument/breast: negative for breast lump, breast tenderness, nipple discharge and skin lesion(s)    Objective:       BP 114/66   Pulse 75   Temp 98.4 F (36.9 C)   Ht 5\' 4"  (1.626 m)   Wt 130 lb 6.4 oz (59.1 kg)   BMI 22.38 kg/m  General:   alert  Skin:   no rash or abnormalities  Lungs:   clear to auscultation bilaterally  Heart:   regular rate and rhythm, S1, S2 normal, no murmur, click, rub or gallop  Breasts:   normal without  suspicious masses, skin or nipple changes or axillary nodes  Abdomen:  normal findings: no organomegaly, soft, non-tender and no hernia  Pelvis:  External genitalia: normal general appearance Urinary system: urethral meatus normal and bladder without fullness, nontender Vaginal: normal without tenderness, induration or masses Cervix: normal appearance Adnexa: normal bimanual exam Uterus: anteverted and non-tender, normal size   Lab Review Urine pregnancy test Labs reviewed yes Radiologic studies reviewed no  50% of 20 min visit spent on counseling and coordination of care.   Assessment:    Healthy female exam.    Contraceptive counseling and advice.  Wants Nexplanon.   Plan:    Education reviewed: safe sex/STD prevention, self breast exams and weight bearing exercise. Contraception: Nexplanon. Follow up in: 2 weeks.   No orders of the defined types were placed in this encounter.  No orders of the defined types were placed in this encounter.     Patient ID: Rachael Oliver, female   DOB: [birth year], 25 y.o.   MRN: [id number]

## 2016-07-28 LAB — HEPATITIS B SURFACE ANTIGEN: HEP B S AG: NEGATIVE

## 2016-07-28 LAB — HEPATITIS C ANTIBODY

## 2016-07-28 LAB — RPR: RPR Ser Ql: NONREACTIVE

## 2016-07-28 LAB — HIV ANTIBODY (ROUTINE TESTING W REFLEX): HIV SCREEN 4TH GENERATION: NONREACTIVE

## 2016-07-30 LAB — NUSWAB VAGINITIS PLUS (VG+)
ATOPOBIUM VAGINAE: HIGH {score} — AB
BVAB 2: HIGH Score — AB
CANDIDA ALBICANS, NAA: POSITIVE — AB
Candida glabrata, NAA: NEGATIVE
Chlamydia trachomatis, NAA: NEGATIVE
NEISSERIA GONORRHOEAE, NAA: NEGATIVE
Trich vag by NAA: NEGATIVE

## 2016-08-01 ENCOUNTER — Other Ambulatory Visit: Payer: Self-pay | Admitting: Obstetrics

## 2016-08-01 DIAGNOSIS — B373 Candidiasis of vulva and vagina: Secondary | ICD-10-CM

## 2016-08-01 DIAGNOSIS — B3731 Acute candidiasis of vulva and vagina: Secondary | ICD-10-CM

## 2016-08-01 MED ORDER — FLUCONAZOLE 150 MG PO TABS: 150.0000 mg | ORAL_TABLET | Freq: Once | ORAL | 2 refills | Status: AC

## 2016-08-02 LAB — PAP IG W/ RFLX HPV ASCU: PAP Smear Comment: 0

## 2016-08-05 LAB — PROCEDURE REPORT - SCANNED: PAP SMEAR: ABNORMAL — AB

## 2016-08-15 ENCOUNTER — Ambulatory Visit: Payer: Self-pay | Admitting: Obstetrics

## 2016-08-28 ENCOUNTER — Inpatient Hospital Stay (HOSPITAL_COMMUNITY)
Admission: AD | Admit: 2016-08-28 | Discharge: 2016-08-28 | Disposition: A | Discharge status: Home / Self Care | Payer: Medicaid Other | Source: Ambulatory Visit | Attending: Obstetrics and Gynecology | Admitting: Obstetrics and Gynecology

## 2016-08-28 ENCOUNTER — Encounter (HOSPITAL_COMMUNITY): Payer: Self-pay

## 2016-08-28 DIAGNOSIS — N760 Acute vaginitis: Secondary | ICD-10-CM | POA: Diagnosis not present

## 2016-08-28 DIAGNOSIS — N3090 Cystitis, unspecified without hematuria: Secondary | ICD-10-CM

## 2016-08-28 DIAGNOSIS — B3731 Acute candidiasis of vulva and vagina: Secondary | ICD-10-CM

## 2016-08-28 DIAGNOSIS — N912 Amenorrhea, unspecified: Secondary | ICD-10-CM | POA: Insufficient documentation

## 2016-08-28 DIAGNOSIS — B9689 Other specified bacterial agents as the cause of diseases classified elsewhere: Secondary | ICD-10-CM | POA: Insufficient documentation

## 2016-08-28 DIAGNOSIS — B373 Candidiasis of vulva and vagina: Secondary | ICD-10-CM | POA: Diagnosis not present

## 2016-08-28 DIAGNOSIS — N898 Other specified noninflammatory disorders of vagina: Secondary | ICD-10-CM | POA: Diagnosis present

## 2016-08-28 LAB — URINALYSIS, ROUTINE W REFLEX MICROSCOPIC
Bilirubin Urine: NEGATIVE
GLUCOSE, UA: NEGATIVE mg/dL
HGB URINE DIPSTICK: NEGATIVE
Ketones, ur: 15 mg/dL — AB
Nitrite: NEGATIVE
Protein, ur: NEGATIVE mg/dL
SPECIFIC GRAVITY, URINE: 1.015 (ref 1.005–1.030)
pH: 6.5 (ref 5.0–8.0)

## 2016-08-28 LAB — URINE MICROSCOPIC-ADD ON: RBC / HPF: NONE SEEN RBC/hpf (ref 0–5)

## 2016-08-28 LAB — POCT PREGNANCY, URINE: Preg Test, Ur: NEGATIVE

## 2016-08-28 LAB — WET PREP, GENITAL
Sperm: NONE SEEN
TRICH WET PREP: NONE SEEN

## 2016-08-28 MED ORDER — METRONIDAZOLE 500 MG PO TABS: 500.0000 mg | ORAL_TABLET | Freq: Two times a day (BID) | ORAL | 0 refills | Status: AC

## 2016-08-28 MED ORDER — FLUCONAZOLE 150 MG PO TABS: ORAL_TABLET | ORAL | 0 refills | Status: DC

## 2016-08-28 NOTE — Discharge Instructions (Signed)
Bacterial Vaginosis Bacterial vaginosis is a vaginal infection that occurs when the normal balance of bacteria in the vagina is disrupted. It results from an overgrowth of certain bacteria. This is the most common vaginal infection in women of childbearing age. Treatment is important to prevent complications, especially in pregnant women, as it can cause a premature delivery. CAUSES  Bacterial vaginosis is caused by an increase in harmful bacteria that are normally present in smaller amounts in the vagina. Several different kinds of bacteria can cause bacterial vaginosis. However, the reason that the condition develops is not fully understood. RISK FACTORS Certain activities or behaviors can put you at an increased risk of developing bacterial vaginosis, including:  Having a new sex partner or multiple sex partners.  Douching.  Using an intrauterine device (IUD) for contraception. Women do not get bacterial vaginosis from toilet seats, bedding, swimming pools, or contact with objects around them. SIGNS AND SYMPTOMS  Some women with bacterial vaginosis have no signs or symptoms. Common symptoms include:  Grey vaginal discharge.  A fishlike odor with discharge, especially after sexual intercourse.  Itching or burning of the vagina and vulva.  Burning or pain with urination. DIAGNOSIS  Your health care provider will take a medical history and examine the vagina for signs of bacterial vaginosis. A sample of vaginal fluid may be taken. Your health care provider will look at this sample under a microscope to check for bacteria and abnormal cells. A vaginal pH test may also be done.  TREATMENT  Bacterial vaginosis may be treated with antibiotic medicines. These may be given in the form of a pill or a vaginal cream. A second round of antibiotics may be prescribed if the condition comes back after treatment. Because bacterial vaginosis increases your risk for sexually transmitted diseases,  getting treated can help reduce your risk for chlamydia, gonorrhea, HIV, and herpes. HOME CARE INSTRUCTIONS   Only take over-the-counter or prescription medicines as directed by your health care provider.  If antibiotic medicine was prescribed, take it as directed. Make sure you finish it even if you start to feel better.  Tell all sexual partners that you have a vaginal infection. They should see their health care provider and be treated if they have problems, such as a mild rash or itching.  During treatment, it is important that you follow these instructions:  Avoid sexual activity or use condoms correctly.  Do not douche.  Avoid alcohol as directed by your health care provider.  Avoid breastfeeding as directed by your health care provider. SEEK MEDICAL CARE IF:   Your symptoms are not improving after 3 days of treatment.  You have increased discharge or pain.  You have a fever. MAKE SURE YOU:   Understand these instructions.  Will watch your condition.  Will get help right away if you are not doing well or get worse. FOR MORE INFORMATION  Centers for Disease Control and Prevention, Division of STD Prevention: [employer] American Sexual Health Association (ASHA): www.ashastd.org    This information is not intended to replace advice given to you by your health care provider. Make sure you discuss any questions you have with your health care provider.   Document Released: 10/24/2005 Document Revised: 11/14/2014 Document Reviewed: 06/05/2013 Elsevier Interactive Patient Education 2016 [employer].  How to Take a [employer] A sitz bath is a warm water bath that is taken while you are sitting down. The water should only come up to your hips and should  cover your buttocks. Your health care provider may recommend a sitz bath to help you:   Clean the lower part of your body, including your genital area.  With itching.  With pain.  With sore muscles or muscles that  tighten or spasm. HOW TO TAKE A SITZ BATH Take 3-4 sitz baths per day or as told by your health care provider. 1. Partially fill a bathtub with warm water. You will only need the water to be deep enough to cover your hips and buttocks when you are sitting in it. 2. If your health care provider told you to put medicine in the water, follow the directions exactly. 3. Sit in the water and open the tub drain a little. 4. Turn on the warm water again to keep the tub at the correct level. Keep the water running constantly. 5. Soak in the water for 15-20 minutes or as told by your health care provider. 6. After the sitz bath, pat the affected area dry first. Do not rub it. 7. Be careful when you stand up after the sitz bath because you may feel dizzy. SEEK MEDICAL CARE IF:  Your symptoms get worse. Do not continue with sitz baths if your symptoms get worse.  You have new symptoms. Do not continue with sitz baths until you talk with your health care provider.   This information is not intended to replace advice given to you by your health care provider. Make sure you discuss any questions you have with your health care provider.   Document Released: 07/16/2004 Document Revised: 03/10/2015 Document Reviewed: 10/22/2014 Elsevier Interactive Patient Education 2016 [employer]  Copyright  VHI. All rights reserved.  Monilial Vaginitis Vaginitis in a soreness, swelling and redness (inflammation) of the vagina and vulva. Monilial vaginitis is not a sexually transmitted infection. CAUSES  Yeast vaginitis is caused by yeast (candida) that is normally found in your vagina. With a yeast infection, the candida has overgrown in number to a point that upsets the chemical balance. SYMPTOMS   White, thick vaginal discharge.  Swelling, itching, redness and irritation of the vagina and possibly the lips of the vagina (vulva).  Burning or painful urination.  Painful intercourse. DIAGNOSIS  Things that  may contribute to monilial vaginitis are:  Postmenopausal and virginal states.  Pregnancy.  Infections.  Being tired, sick or stressed, especially if you had monilial vaginitis in the past.  Diabetes. Good control will help lower the chance.  Birth control pills.  Tight fitting garments.  Using bubble bath, feminine sprays, douches or deodorant tampons.  Taking certain medications that kill germs (antibiotics).  Sporadic recurrence can occur if you become ill. TREATMENT  Your caregiver will give you medication.  There are several kinds of anti monilial vaginal creams and suppositories specific for monilial vaginitis. For recurrent yeast infections, use a suppository or cream in the vagina 2 times a week, or as directed.  Anti-monilial or steroid cream for the itching or irritation of the vulva may also be used. Get your caregiver's permission.  Painting the vagina with methylene blue solution may help if the monilial cream does not work.  Eating yogurt may help prevent monilial vaginitis. HOME CARE INSTRUCTIONS   Finish all medication as prescribed.  Do not have sex until treatment is completed or after your caregiver tells you it is okay.  Take warm sitz baths.  Do not douche.  Do not use tampons, especially scented ones.  Wear cotton underwear.  Avoid tight pants and panty  hose.  Tell your sexual partner that you have a yeast infection. They should go to their caregiver if they have symptoms such as mild rash or itching.  Your sexual partner should be treated as well if your infection is difficult to eliminate.  Practice safer sex. Use condoms.  Some vaginal medications cause latex condoms to fail. Vaginal medications that harm condoms are:  Cleocin cream.  Butoconazole (Femstat).  Terconazole (Terazol) vaginal suppository.  Miconazole (Monistat) (may be purchased over the counter). SEEK MEDICAL CARE IF:   You have a temperature by mouth above 102  F (38.9 C).  The infection is getting worse after 2 days of treatment.  The infection is not getting better after 3 days of treatment.  You develop blisters in or around your vagina.  You develop vaginal bleeding, and it is not your menstrual period.  You have pain when you urinate.  You develop intestinal problems.  You have pain with sexual intercourse.   This information is not intended to replace advice given to you by your health care provider. Make sure you discuss any questions you have with your health care provider.   Document Released: 08/03/2005 Document Revised: 01/16/2012 Document Reviewed: 04/27/2015 Elsevier Interactive Patient Education [employer]

## 2016-08-28 NOTE — MAU Note (Signed)
Patient presents to mau with c/o vaginal itching and feeling swollen x3 days; denies abnormal discharge at this time.

## 2016-08-28 NOTE — MAU Provider Note (Signed)
History     CSN: [id number]  Arrival date and time: 08/28/16 2211   First Provider Initiated Contact with Patient 08/28/16 2232      Chief Complaint  Patient presents with  . Vaginitis   Rachael Oliver is a 25 y.o. [phone] presenting with vulvo-vaginal irritation and white vaginal discharge. Similar symptoms with yeast infections in the past. Reports burning of labia with urinating. Self treated yesterday with a cream prescribed for yeast in the past; no improvement. Had STI workup with negative HIV on 07/27/2016. Also concerned that she is amenorrheic since her last Depo shot in July. Has appointment with Dr. Clearance CootsHarper in 2 weeks to resume contraception.    OB History  Gravida Para Term Preterm AB Living  3 3 2 1   3   SAB TAB Ectopic Multiple Live Births          2    # Outcome Date GA Lbr Len/2nd Weight Sex Delivery Anes PTL Lv  3 Term 03/15/14 1657w0d 02:20 / 00:15 3.52 kg (7 lb 12.2 oz) M Vag-Spont EPI  LIV  2 Term 2014     Vag-Spont     1 Preterm 2011     Vag-Spont   LIV       Past Medical History:  Diagnosis Date  . Asthma    as a child  . History of methicillin resistant staphylococcus aureus (MRSA)   . Infection    left elbow  . Syncope   . Vaginal Pap smear, abnormal     Past Surgical History:  Procedure Laterality Date  . ELBOW SURGERY Left   . FOOT SURGERY    . GYNECOLOGIC CRYOSURGERY    . I&D EXTREMITY Left 12/03/2015   Procedure: IRRIGATION AND DEBRIDEMENT LEFT ELBOW;  Surgeon: Tarry KosNaiping M Xu, MD;  Location: [facility] OR;  Service: Orthopedics;  Laterality: Left;  . uterine biopsy      Family History  Problem Relation Age of Onset  . Alcohol abuse Neg Hx   . Arthritis Neg Hx   . Asthma Neg Hx   . Birth defects Neg Hx   . COPD Neg Hx   . Cancer Neg Hx   . Depression Neg Hx   . Diabetes Neg Hx   . Drug abuse Neg Hx   . Early death Neg Hx   . Hearing loss Neg Hx   . Heart disease Neg Hx   . Hyperlipidemia Neg Hx   . Hypertension Neg Hx   . Kidney  disease Neg Hx   . Learning disabilities Neg Hx   . Mental illness Neg Hx   . Mental retardation Neg Hx   . Miscarriages / Stillbirths Neg Hx   . Stroke Neg Hx   . Vision loss Neg Hx     Social History  Substance Use Topics  . Smoking status: Never Smoker  . Smokeless tobacco: Never Used  . Alcohol use No    Allergies: No Known Allergies  Prescriptions Prior to Admission  Medication Sig Dispense Refill Last Dose  . metroNIDAZOLE (FLAGYL) 500 MG tablet Take 1 tablet (500 mg total) by mouth 2 (two) times daily. 14 tablet 2     Review of Systems  Constitutional: Negative for fever.  Gastrointestinal: Negative for nausea and vomiting.  Genitourinary: Negative for dysuria, flank pain, frequency, hematuria and urgency.  Skin: Negative for itching and rash.   Physical Exam   Blood pressure 108/77, pulse 77, temperature 98.2 F (36.8 C), temperature source Oral, resp.  rate 16, weight 59 kg (130 lb 1.9 oz), SpO2 100 %.  Physical Exam  Nursing note and vitals reviewed. Constitutional: She is oriented to person, place, and time. She appears well-developed and well-nourished. No distress.  HENT:  Head: Normocephalic.  Eyes: Pupils are equal, round, and reactive to light.  Neck: Normal range of motion.  Cardiovascular: Normal rate.   Respiratory: Effort normal.  GI: Soft. There is no tenderness.  Genitourinary:  Genitourinary Comments: External genitalia: redness, some excoriation of right labium majoram Vaginal mucosa reddened, white creamy discharge Cervix multiparous, clean Uterus NSSP, NT No adnexal tenderness or masses   Musculoskeletal: Normal range of motion. She exhibits no tenderness.  No CVAT  Neurological: She is alert and oriented to person, place, and time.  Skin: Skin is warm and dry.  Psychiatric: She has a normal mood and affect. Her behavior is normal.    MAU Course  Procedures Results for orders placed or performed during the hospital encounter of  08/28/16 (from the past 24 hour(s))  Urinalysis, Routine w reflex microscopic (not at [facility])     Status: Abnormal   Collection Time: 08/28/16 10:25 PM  Result Value Ref Range   Color, Urine YELLOW YELLOW   APPearance HAZY (A) CLEAR   Specific Gravity, Urine 1.015 1.005 - 1.030   pH 6.5 5.0 - 8.0   Glucose, UA NEGATIVE NEGATIVE mg/dL   Hgb urine dipstick NEGATIVE NEGATIVE   Bilirubin Urine NEGATIVE NEGATIVE   Ketones, ur 15 (A) NEGATIVE mg/dL   Protein, ur NEGATIVE NEGATIVE mg/dL   Nitrite NEGATIVE NEGATIVE   Leukocytes, UA LARGE (A) NEGATIVE  Urine microscopic-add on     Status: Abnormal   Collection Time: 08/28/16 10:25 PM  Result Value Ref Range   Squamous Epithelial / LPF 6-30 (A) NONE SEEN   WBC, UA 6-30 0 - 5 WBC/hpf   RBC / HPF NONE SEEN 0 - 5 RBC/hpf   Bacteria, UA MANY (A) NONE SEEN   Urine-Other MUCOUS PRESENT   Wet prep, genital     Status: Abnormal   Collection Time: 08/28/16 10:40 PM  Result Value Ref Range   Yeast Wet Prep HPF POC PRESENT (A) NONE SEEN   Trich, Wet Prep NONE SEEN NONE SEEN   Clue Cells Wet Prep HPF POC PRESENT (A) NONE SEEN   WBC, Wet Prep HPF POC MODERATE (A) NONE SEEN   Sperm NONE SEEN   Pregnancy, urine POC     Status: None   Collection Time: 08/28/16 10:43 PM  Result Value Ref Range   Preg Test, Ur NEGATIVE NEGATIVE     Assessment and Plan  Vulvovaginitis due to yeast - Plan: Discharge patient  BV (bacterial vaginosis) - Plan: Discharge patient    Medication List    TAKE these medications   fluconazole 150 MG tablet Commonly known as:  DIFLUCAN Isow and repeat in 2 days if neededTake one n   metroNIDAZOLE 500 MG tablet Commonly known as:  FLAGYL Take 1 tablet (500 mg total) by mouth 2 (two) times daily.      Follow-up Information    HARPER,CHARLES A, MD Follow up on 09/12/2017.   Specialty:  Obstetrics and Gynecology Why:  Keep your scheduled appointment Contact information: [address] [place]  [address] [phone]           Twana Wileman 08/28/2016, 10:42 PM

## 2016-08-29 LAB — GC/CHLAMYDIA PROBE AMP (~~LOC~~) NOT AT ARMC
Chlamydia: NEGATIVE
Neisseria Gonorrhea: NEGATIVE

## 2016-09-13 ENCOUNTER — Ambulatory Visit: Payer: Medicaid Other | Admitting: Obstetrics

## 2016-09-14 ENCOUNTER — Other Ambulatory Visit: Payer: Self-pay | Admitting: Obstetrics and Gynecology

## 2016-09-27 ENCOUNTER — Other Ambulatory Visit: Payer: Self-pay | Admitting: Obstetrics and Gynecology

## 2016-10-03 ENCOUNTER — Inpatient Hospital Stay (HOSPITAL_COMMUNITY)
Admission: AD | Admit: 2016-10-03 | Discharge: 2016-10-03 | Disposition: A | Discharge status: Home / Self Care | Payer: Medicaid Other | Source: Ambulatory Visit | Attending: Obstetrics and Gynecology | Admitting: Obstetrics and Gynecology

## 2016-10-03 ENCOUNTER — Encounter (HOSPITAL_COMMUNITY): Payer: Self-pay | Admitting: *Deleted

## 2016-10-03 DIAGNOSIS — J45909 Unspecified asthma, uncomplicated: Secondary | ICD-10-CM | POA: Diagnosis not present

## 2016-10-03 DIAGNOSIS — Z202 Contact with and (suspected) exposure to infections with a predominantly sexual mode of transmission: Secondary | ICD-10-CM | POA: Diagnosis not present

## 2016-10-03 DIAGNOSIS — Z8614 Personal history of Methicillin resistant Staphylococcus aureus infection: Secondary | ICD-10-CM | POA: Diagnosis not present

## 2016-10-03 DIAGNOSIS — B001 Herpesviral vesicular dermatitis: Secondary | ICD-10-CM | POA: Insufficient documentation

## 2016-10-03 DIAGNOSIS — Z711 Person with feared health complaint in whom no diagnosis is made: Secondary | ICD-10-CM

## 2016-10-03 LAB — URINALYSIS, ROUTINE W REFLEX MICROSCOPIC
Bilirubin Urine: NEGATIVE
Glucose, UA: NEGATIVE mg/dL
Hgb urine dipstick: NEGATIVE
KETONES UR: 15 mg/dL — AB
NITRITE: NEGATIVE
PROTEIN: NEGATIVE mg/dL
Specific Gravity, Urine: 1.02 (ref 1.005–1.030)
pH: 6 (ref 5.0–8.0)

## 2016-10-03 LAB — URINE MICROSCOPIC-ADD ON

## 2016-10-03 LAB — WET PREP, GENITAL
Clue Cells Wet Prep HPF POC: NONE SEEN
SPERM: NONE SEEN
Trich, Wet Prep: NONE SEEN
Yeast Wet Prep HPF POC: NONE SEEN

## 2016-10-03 LAB — POCT PREGNANCY, URINE: PREG TEST UR: NEGATIVE

## 2016-10-03 MED ORDER — VALACYCLOVIR HCL 1 G PO TABS
2000.0000 mg | ORAL_TABLET | Freq: Two times a day (BID) | ORAL | 0 refills | Status: DC
Start: 2016-10-03 — End: 2017-06-24

## 2016-10-03 NOTE — Discharge Instructions (Signed)
Cold Sore Introduction A cold sore, also called a fever blister, is a skin infection that is caused by a virus. This infection causes small, fluid-filled sores to form inside of the mouth or on the lips, gums, nose, chin, or cheeks. Cold sores can spread to other parts of the body, such as the eyes or fingers. Cold sores can be spread or passed from person to person (contagious) until the sores crust over completely. Cold sores can be spread through close contact, such as kissing or sharing a drinking glass. Follow these instructions at home: Medicines  Take or apply over-the-counter and prescription medicines only as told by your doctor.  Use a cotton-tip swab to apply creams or gels to your sores. Sore Care  Do not touch the sores or pick the scabs.  Wash your hands often. Do not touch your eyes without washing your hands first.  Keep the sores clean and dry.  If directed, apply ice to the sores:  Put ice in a plastic bag.  Place a towel between your skin and the bag.  Leave the ice on for 20 minutes, 2-3 times per day. Lifestyle  Do not kiss, have oral sex, or share personal items until your sores heal.  Eat a soft, bland diet. Avoid eating hot, cold, or salty foods. These can hurt your mouth.  Use a straw if it hurts to drink out of a glass.  Avoid the sun and limit your stress if these things trigger outbreaks. If sun causes cold sores, apply sunscreen on your lips before being out in the sun. Contact a doctor if:  You have symptoms for more than two weeks.  You have pus coming from the sores.  You have redness that is spreading.  You have pain or irritation in your eye.  You get sores on your genitals.  Your sores do not heal within two weeks.  You get cold sores often. Get help right away if:  You have a fever and your symptoms suddenly get worse.  You have a headache and confusion. This information is not intended to replace advice given to you by your  health care provider. Make sure you discuss any questions you have with your health care provider. Document Released: 04/24/2012 Document Revised: 03/31/2016 Document Reviewed: 08/14/2015  2017 Elsevier Sexually Transmitted Disease A sexually transmitted disease (STD) is a disease or infection often passed to another person during sex. However, STDs can be passed through nonsexual ways. An STD can be passed through:  Spit (saliva).  Semen.  Blood.  Mucus from the vagina.  Pee (urine). How can I lessen my chances of getting an STD?  Use:  Latex condoms.  Water-soluble lubricants with condoms. Do not use petroleum jelly or oils.  Dental dams. These are small pieces of latex that are used as a barrier during oral sex.  Avoid having more than one sex partner.  Do not have sex with someone who has other sex partners.  Do not have sex with anyone you do not know or who is at high risk for an STD.  Avoid risky sex that can break your skin.  Do not have sex if you have open sores on your mouth or skin.  Avoid drinking too much alcohol or taking illegal drugs. Alcohol and drugs can affect your good judgment.  Avoid oral and anal sex acts.  Get shots (vaccines) for HPV and hepatitis.  If you are at risk of being infected with HIV, it is  advised that you take a certain medicine daily to prevent HIV infection. This is called pre-exposure prophylaxis (PrEP). You may be at risk if:  You are a man who has sex with other men (MSM).  You are attracted to the opposite sex (heterosexual) and are having sex with more than one partner.  You take drugs with a needle.  You have sex with someone who has HIV.  Talk with your doctor about if you are at high risk of being infected with HIV. If you begin to take PrEP, get tested for HIV first. Get tested every 3 months for as long as you are taking PrEP.  Get tested for STDs every year if you are sexually active. If you are treated for an  STD, get tested again 3 months after you are treated. What should I do if I think I have an STD?  See your doctor.  Tell your sex partner(s) that you have an STD. They should be tested and treated.  Do not have sex until your doctor says it is okay. When should I get help? Get help right away if:  You have bad belly (abdominal) pain.  You are a man and have puffiness (swelling) or pain in your testicles.  You are a woman and have puffiness in your vagina. This information is not intended to replace advice given to you by your health care provider. Make sure you discuss any questions you have with your health care provider. Document Released: 12/01/2004 Document Revised: 03/31/2016 Document Reviewed: 04/19/2013 Elsevier Interactive Patient Education  2017 [employer]

## 2016-10-03 NOTE — MAU Note (Signed)
Pt presents to MAU for STD testing. Last depo shot was in July so she has not had a cycle since she started taking it a year ago. Denies any vaginal bleeding or abnormal discharge

## 2016-10-03 NOTE — MAU Provider Note (Signed)
History     CSN: [id number]  Arrival date and time: 10/03/16 1116   First Provider Initiated Contact with Patient 10/03/16 1417        Chief Complaint  Patient presents with  . Exposure to STD   HPI [name] is a 25 y.o. [phone] who presents to MAU today with complaint of cold sore and desire for STD testing. Patient states cold sore noted on upper lip last night. She denies any known exposure to HSV or history of cold sores. She denies vaginal sores, abdominal pain, vaginal discharge or bleeding.   OB History    Gravida Para Term Preterm AB Living   3 3 2 1   3    SAB TAB Ectopic Multiple Live Births           2      Past Medical History:  Diagnosis Date  . Asthma    as a child  . History of methicillin resistant staphylococcus aureus (MRSA)   . Infection    left elbow  . Syncope   . Vaginal Pap smear, abnormal     Past Surgical History:  Procedure Laterality Date  . ELBOW SURGERY Left   . FOOT SURGERY    . GYNECOLOGIC CRYOSURGERY    . I&D EXTREMITY Left 12/03/2015   Procedure: IRRIGATION AND DEBRIDEMENT LEFT ELBOW;  Surgeon: Tarry KosNaiping M Xu, MD;  Location: [facility] OR;  Service: Orthopedics;  Laterality: Left;  . uterine biopsy      Family History  Problem Relation Age of Onset  . Alcohol abuse Neg Hx   . Arthritis Neg Hx   . Asthma Neg Hx   . Birth defects Neg Hx   . COPD Neg Hx   . Cancer Neg Hx   . Depression Neg Hx   . Diabetes Neg Hx   . Drug abuse Neg Hx   . Early death Neg Hx   . Hearing loss Neg Hx   . Heart disease Neg Hx   . Hyperlipidemia Neg Hx   . Hypertension Neg Hx   . Kidney disease Neg Hx   . Learning disabilities Neg Hx   . Mental illness Neg Hx   . Mental retardation Neg Hx   . Miscarriages / Stillbirths Neg Hx   . Stroke Neg Hx   . Vision loss Neg Hx     Social History  Substance Use Topics  . Smoking status: Never Smoker  . Smokeless tobacco: Never Used  . Alcohol use No    Allergies: No Known  Allergies  Prescriptions Prior to Admission  Medication Sig Dispense Refill Last Dose  . fluconazole (DIFLUCAN) 150 MG tablet Isow and repeat in 2 days if neededTake one n 2 tablet 0     Review of Systems  Gastrointestinal: Negative for abdominal pain.  Genitourinary:       Neg - vaginal bleeding, discharge, vaginal sores  Skin: Negative for rash.       + erythema of the upper lip   Physical Exam   Blood pressure 124/73, pulse 76, temperature 98.4 F (36.9 C), resp. rate 18, height 5\' 4"  (1.626 m), weight 129 lb (58.5 kg).  Physical Exam  Nursing note and vitals reviewed. Constitutional: She is oriented to person, place, and time. She appears well-developed and well-nourished. No distress.  HENT:  Head: Normocephalic and atraumatic.    Cardiovascular: Normal rate.   Respiratory: Effort normal.  GI: Soft.  Neurological: She is alert and oriented to person,  place, and time.  Skin: Skin is warm and dry. No erythema.  Psychiatric: She has a normal mood and affect.    Results for orders placed or performed during the hospital encounter of 10/03/16 (from the past 24 hour(s))  Urinalysis, Routine w reflex microscopic ([facility])     Status: Abnormal   Collection Time: 10/03/16  1:00 PM  Result Value Ref Range   Color, Urine YELLOW YELLOW   APPearance HAZY (A) CLEAR   Specific Gravity, Urine 1.020 1.005 - 1.030   pH 6.0 5.0 - 8.0   Glucose, UA NEGATIVE NEGATIVE mg/dL   Hgb urine dipstick NEGATIVE NEGATIVE   Bilirubin Urine NEGATIVE NEGATIVE   Ketones, ur 15 (A) NEGATIVE mg/dL   Protein, ur NEGATIVE NEGATIVE mg/dL   Nitrite NEGATIVE NEGATIVE   Leukocytes, UA MODERATE (A) NEGATIVE  Urine microscopic-add on     Status: Abnormal   Collection Time: 10/03/16  1:00 PM  Result Value Ref Range   Squamous Epithelial / LPF 0-5 (A) NONE SEEN   WBC, UA 6-30 0 - 5 WBC/hpf   RBC / HPF 0-5 0 - 5 RBC/hpf   Bacteria, UA FEW (A) NONE SEEN  Pregnancy, urine POC     Status: None    Collection Time: 10/03/16  1:17 PM  Result Value Ref Range   Preg Test, Ur NEGATIVE NEGATIVE  Wet prep, genital     Status: Abnormal   Collection Time: 10/03/16  1:50 PM  Result Value Ref Range   Yeast Wet Prep HPF POC NONE SEEN NONE SEEN   Trich, Wet Prep NONE SEEN NONE SEEN   Clue Cells Wet Prep HPF POC NONE SEEN NONE SEEN   WBC, Wet Prep HPF POC MODERATE (A) NONE SEEN   Sperm NONE SEEN     MAU Course  Procedures None  MDM UPT - negative UA, wet prep, GC/Chlamydia, HIV and RPR   Assessment and Plan  A: Herpes labialis   P: Discharge home Rx for Valtrex sent to patient's pharmacy Patient advised to follow-up with [facility] as scheduled tomorrow Patient may return to MAU as needed or if her condition were to change or worsen   Marny LowensteinJulie N Wenzel, PA-C  10/03/2016, 2:16 PM

## 2016-10-04 ENCOUNTER — Ambulatory Visit: Payer: Medicaid Other | Admitting: Obstetrics

## 2016-10-04 LAB — HIV ANTIBODY (ROUTINE TESTING W REFLEX): HIV SCREEN 4TH GENERATION: NONREACTIVE

## 2016-10-04 LAB — RPR: RPR: NONREACTIVE

## 2016-10-05 ENCOUNTER — Emergency Department (HOSPITAL_COMMUNITY)
Admission: EM | Admit: 2016-10-05 | Discharge: 2016-10-05 | Disposition: A | Discharge status: Home / Self Care | Payer: Medicaid Other | Attending: Emergency Medicine | Admitting: Emergency Medicine

## 2016-10-05 ENCOUNTER — Encounter (HOSPITAL_COMMUNITY): Payer: Self-pay

## 2016-10-05 DIAGNOSIS — Z202 Contact with and (suspected) exposure to infections with a predominantly sexual mode of transmission: Secondary | ICD-10-CM | POA: Diagnosis present

## 2016-10-05 DIAGNOSIS — J45909 Unspecified asthma, uncomplicated: Secondary | ICD-10-CM | POA: Insufficient documentation

## 2016-10-05 LAB — GC/CHLAMYDIA PROBE AMP (~~LOC~~) NOT AT ARMC
Chlamydia: NEGATIVE
NEISSERIA GONORRHEA: NEGATIVE
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
## 2016-10-05 NOTE — ED Triage Notes (Signed)
Patient here requesting to be checked for herpes. Denies any outbreak but has small bump to mouth

## 2016-10-06 ENCOUNTER — Encounter: Payer: Self-pay | Admitting: Certified Nurse Midwife

## 2016-10-06 ENCOUNTER — Encounter: Payer: Self-pay | Admitting: *Deleted

## 2016-10-06 ENCOUNTER — Ambulatory Visit (INDEPENDENT_AMBULATORY_CARE_PROVIDER_SITE_OTHER): Payer: Medicaid Other | Admitting: Certified Nurse Midwife

## 2016-10-06 VITALS — BP 120/73 | HR 78 | Wt 127.0 lb

## 2016-10-06 DIAGNOSIS — Z202 Contact with and (suspected) exposure to infections with a predominantly sexual mode of transmission: Secondary | ICD-10-CM | POA: Diagnosis not present

## 2016-10-06 DIAGNOSIS — B009 Herpesviral infection, unspecified: Secondary | ICD-10-CM

## 2016-10-06 DIAGNOSIS — B001 Herpesviral vesicular dermatitis: Secondary | ICD-10-CM
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
## 2016-10-08 LAB — HSV(HERPES SIMPLEX VRS) I + II AB-IGM: HSVI/II COMB AB IGM: 1.39 ratio — AB (ref 0.00–0.90)

## 2016-10-17 LAB — HSV 1 AND 2 IGM ABS, INDIRECT
HSV 1 IgM: <1:10 {titer}
HSV 2 IgM: <1:10 {titer}

## 2016-10-17 LAB — SPECIMEN STATUS REPORT

## 2016-10-19 ENCOUNTER — Emergency Department (HOSPITAL_COMMUNITY)
Admission: EM | Admit: 2016-10-19 | Discharge: 2016-10-19 | Disposition: A | Discharge status: Home / Self Care | Payer: Medicaid Other | Attending: Emergency Medicine | Admitting: Emergency Medicine

## 2016-10-19 ENCOUNTER — Encounter (HOSPITAL_COMMUNITY): Payer: Self-pay

## 2016-10-19 DIAGNOSIS — X58XXXA Exposure to other specified factors, initial encounter: Secondary | ICD-10-CM | POA: Insufficient documentation

## 2016-10-19 DIAGNOSIS — Y999 Unspecified external cause status: Secondary | ICD-10-CM | POA: Insufficient documentation

## 2016-10-19 DIAGNOSIS — J45909 Unspecified asthma, uncomplicated: Secondary | ICD-10-CM | POA: Diagnosis not present

## 2016-10-19 DIAGNOSIS — S61309A Unspecified open wound of unspecified finger with damage to nail, initial encounter: Secondary | ICD-10-CM

## 2016-10-19 DIAGNOSIS — Y929 Unspecified place or not applicable: Secondary | ICD-10-CM | POA: Diagnosis not present

## 2016-10-19 DIAGNOSIS — S6992XA Unspecified injury of left wrist, hand and finger(s), initial encounter: Secondary | ICD-10-CM | POA: Diagnosis present

## 2016-10-19 DIAGNOSIS — Y939 Activity, unspecified: Secondary | ICD-10-CM | POA: Diagnosis not present

## 2016-10-19 DIAGNOSIS — S61303A Unspecified open wound of left middle finger with damage to nail, initial encounter: Secondary | ICD-10-CM | POA: Insufficient documentation

## 2016-10-19 MED ORDER — IBUPROFEN 400 MG PO TABS: 600.0000 mg | ORAL_TABLET | Freq: Once | ORAL | Status: DC
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
## 2016-10-19 NOTE — ED Triage Notes (Signed)
Patient here with middle finger nailbed injury. Ripped fake nail and now has bleeding to nailbed and pain. NAD

## 2016-10-19 NOTE — ED Notes (Signed)
Pt is in stable condition upon d/c and ambulates from ED. 

## 2016-10-19 NOTE — Discharge Instructions (Signed)
Change dressing once daily. Apply antibiotic ointment and clean dressing with a yellow, Vaseline gauze and I gave you. Please follow-up with Dr. Janee Mornhompson, a hand doctor, if your symptoms are not improving. It can take many months for your nail to grow back. Please return to emergency department if you develop any new or worsening symptoms including fever, increasing pain, drainage, red streaking up your finger or hand.

## 2016-10-19 NOTE — ED Notes (Signed)
Wound care finished for patient and splint applied to right middle finger.

## 2016-10-24 ENCOUNTER — Ambulatory Visit: Payer: Self-pay | Admitting: Obstetrics

## 2016-10-28 ENCOUNTER — Encounter: Payer: Self-pay | Admitting: *Deleted

## 2016-12-14 ENCOUNTER — Ambulatory Visit (INDEPENDENT_AMBULATORY_CARE_PROVIDER_SITE_OTHER): Payer: Medicaid Other | Admitting: Obstetrics

## 2016-12-14 ENCOUNTER — Other Ambulatory Visit (HOSPITAL_COMMUNITY)
Admission: RE | Admit: 2016-12-14 | Discharge: 2016-12-14 | Disposition: A | Discharge status: Home / Self Care | Payer: Medicaid Other | Source: Ambulatory Visit | Attending: Obstetrics | Admitting: Obstetrics

## 2016-12-14 ENCOUNTER — Encounter: Payer: Self-pay | Admitting: Obstetrics

## 2016-12-14 VITALS — BP 110/76 | HR 68 | Wt 129.0 lb

## 2016-12-14 DIAGNOSIS — B9689 Other specified bacterial agents as the cause of diseases classified elsewhere: Secondary | ICD-10-CM | POA: Diagnosis not present

## 2016-12-14 DIAGNOSIS — Z3202 Encounter for pregnancy test, result negative: Secondary | ICD-10-CM

## 2016-12-14 DIAGNOSIS — N760 Acute vaginitis: Secondary | ICD-10-CM | POA: Diagnosis not present

## 2016-12-14 DIAGNOSIS — Z3009 Encounter for other general counseling and advice on contraception: Secondary | ICD-10-CM | POA: Diagnosis not present

## 2016-12-14 DIAGNOSIS — N898 Other specified noninflammatory disorders of vagina: Secondary | ICD-10-CM | POA: Diagnosis present

## 2016-12-14 DIAGNOSIS — N911 Secondary amenorrhea: Secondary | ICD-10-CM | POA: Diagnosis not present

## 2016-12-14 LAB — POCT URINE PREGNANCY: PREG TEST UR: NEGATIVE

## 2016-12-14 MED ORDER — TINIDAZOLE 500 MG PO TABS: 1000.0000 mg | ORAL_TABLET | Freq: Every day | ORAL | 5 refills | Status: DC

## 2016-12-14 MED ORDER — MEDROXYPROGESTERONE ACETATE 150 MG/ML IM SUSP: 150.0000 mg | INTRAMUSCULAR | 4 refills | Status: DC

## 2016-12-14 MED ORDER — MEDROXYPROGESTERONE ACETATE 10 MG PO TABS: 10.0000 mg | ORAL_TABLET | Freq: Every day | ORAL | 0 refills | Status: DC

## 2016-12-14 NOTE — Addendum Note (Signed)
Addended by: Maretta BeesMCGLASHAN, Finneus Kaneshiro J on: 12/14/2016 11:41 AM   Modules accepted: Orders

## 2016-12-14 NOTE — Progress Notes (Signed)
Patient ID: Rachael Oliver, female   DOB: 05/11/1991, 26 y.o.   MRN: [id number]  Chief Complaint  Patient presents with  . Vaginal Discharge    Patient wants to be cheched for BV    HPI Rachael Oliver is a 26 y.o. female.  Malodorous vaginal discharge.  Wants to restart Depo Provera.  No period since stopping Depo August 2017. HPI  Past Medical History:  Diagnosis Date  . Asthma    as a child  . History of methicillin resistant staphylococcus aureus (MRSA)   . Infection    left elbow  . Syncope   . Vaginal Pap smear, abnormal     Past Surgical History:  Procedure Laterality Date  . ELBOW SURGERY Left   . FOOT SURGERY    . GYNECOLOGIC CRYOSURGERY    . I&D EXTREMITY Left 12/03/2015   Procedure: IRRIGATION AND DEBRIDEMENT LEFT ELBOW;  Surgeon: Tarry Kos, MD;  Location: [facility] OR;  Service: Orthopedics;  Laterality: Left;  . uterine biopsy      Family History  Problem Relation Age of Onset  . Alcohol abuse Neg Hx   . Arthritis Neg Hx   . Asthma Neg Hx   . Birth defects Neg Hx   . COPD Neg Hx   . Cancer Neg Hx   . Depression Neg Hx   . Diabetes Neg Hx   . Drug abuse Neg Hx   . Early death Neg Hx   . Hearing loss Neg Hx   . Heart disease Neg Hx   . Hyperlipidemia Neg Hx   . Hypertension Neg Hx   . Kidney disease Neg Hx   . Learning disabilities Neg Hx   . Mental illness Neg Hx   . Mental retardation Neg Hx   . Miscarriages / Stillbirths Neg Hx   . Stroke Neg Hx   . Vision loss Neg Hx     Social History Social History  Substance Use Topics  . Smoking status: Never Smoker  . Smokeless tobacco: Never Used  . Alcohol use No    No Known Allergies  Current Outpatient Prescriptions  Medication Sig Dispense Refill  . fluconazole (DIFLUCAN) 150 MG tablet take 1 tablet by mouth NOW AND REPEAT IN 2 DAYS IF NEEDED 2 tablet 0  . medroxyPROGESTERone (DEPO-PROVERA) 150 MG/ML injection Inject 1 mL (150 mg total) into the muscle every 3 (three) months. 1 mL 4  .  medroxyPROGESTERone (PROVERA) 10 MG tablet Take 1 tablet (10 mg total) by mouth daily. 14 tablet 0  . tinidazole (TINDAMAX) 500 MG tablet Take 2 tablets (1,000 mg total) by mouth daily with breakfast. 10 tablet 5  . valACYclovir (VALTREX) 1000 MG tablet Take 2 tablets (2,000 mg total) by mouth 2 (two) times daily. (Patient not taking: Reported on 10/06/2016) 4 tablet 0   No current facility-administered medications for this visit.     Review of Systems Review of Systems Constitutional: negative for fatigue and weight loss Respiratory: negative for cough and wheezing Cardiovascular: negative for chest pain, fatigue and palpitations Gastrointestinal: negative for abdominal pain and change in bowel habits Genitourinary:positive for secondary amenorrhea Integument/breast: negative for nipple discharge Musculoskeletal:negative for myalgias Neurological: negative for gait problems and tremors Behavioral/Psych: negative for abusive relationship, depression Endocrine: negative for temperature intolerance      Blood pressure 110/76, pulse 68, weight 129 lb (58.5 kg).  Physical Exam Physical Exam            General:  Alert and  no distress Abdomen:  normal findings: no organomegaly, soft, non-tender and no hernia  Pelvis:  External genitalia: normal general appearance Urinary system: urethral meatus normal and bladder without fullness, nontender Vaginal: normal without tenderness, induration or masses Cervix: normal appearance Adnexa: normal bimanual exam Uterus: anteverted and non-tender, normal size    50% of 15 min visit spent on counseling and coordination of care.    Data Reviewed Wet prep  Assessment     BV Secondary amenorrhea Contraceptive counseling and advice    Plan     Tindamax Rx  Provera Rx for amenorrhea  Depo Provera Rx with instructions to start with onset of period  Orders Placed This Encounter  Procedures  . POCT urine pregnancy   Meds ordered this  encounter  Medications  . tinidazole (TINDAMAX) 500 MG tablet    Sig: Take 2 tablets (1,000 mg total) by mouth daily with breakfast.    Dispense:  10 tablet    Refill:  5  . medroxyPROGESTERone (DEPO-PROVERA) 150 MG/ML injection    Sig: Inject 1 mL (150 mg total) into the muscle every 3 (three) months.    Dispense:  1 mL    Refill:  4  . medroxyPROGESTERone (PROVERA) 10 MG tablet    Sig: Take 1 tablet (10 mg total) by mouth daily.    Dispense:  14 tablet    Refill:  0

## 2016-12-14 NOTE — Progress Notes (Signed)
Patient wants to be cheched for BV. She also want to restart DEPO.  First Pregnancy Test done. UPT- NEG Patient advised to come back in two weeks for another UPT and to not have unprotected sex.

## 2016-12-15 LAB — CERVICOVAGINAL ANCILLARY ONLY
Bacterial vaginitis: POSITIVE — AB
CANDIDA VAGINITIS: NEGATIVE
Chlamydia: NEGATIVE
Neisseria Gonorrhea: NEGATIVE
TRICH (WINDOWPATH): NEGATIVE

## 2016-12-16 ENCOUNTER — Other Ambulatory Visit: Payer: Self-pay | Admitting: Obstetrics

## 2017-01-10 ENCOUNTER — Telehealth: Payer: Self-pay

## 2017-01-10 NOTE — Telephone Encounter (Signed)
Returned call, no answer, left vm 

## 2017-01-12 ENCOUNTER — Telehealth: Payer: Self-pay

## 2017-01-12 ENCOUNTER — Ambulatory Visit (INDEPENDENT_AMBULATORY_CARE_PROVIDER_SITE_OTHER): Payer: Medicaid Other | Admitting: Obstetrics & Gynecology

## 2017-01-12 ENCOUNTER — Other Ambulatory Visit (HOSPITAL_COMMUNITY)
Admission: RE | Admit: 2017-01-12 | Discharge: 2017-01-12 | Disposition: A | Discharge status: Home / Self Care | Payer: Medicaid Other | Source: Ambulatory Visit | Attending: Obstetrics & Gynecology | Admitting: Obstetrics & Gynecology

## 2017-01-12 VITALS — BP 113/77 | HR 74 | Wt 132.0 lb

## 2017-01-12 DIAGNOSIS — N760 Acute vaginitis: Secondary | ICD-10-CM

## 2017-01-12 DIAGNOSIS — N898 Other specified noninflammatory disorders of vagina: Secondary | ICD-10-CM | POA: Diagnosis not present

## 2017-01-12 DIAGNOSIS — Z113 Encounter for screening for infections with a predominantly sexual mode of transmission: Secondary | ICD-10-CM | POA: Diagnosis present

## 2017-01-12 MED ORDER — FLUCONAZOLE 150 MG PO TABS: 150.0000 mg | ORAL_TABLET | Freq: Once | ORAL | 3 refills | Status: AC

## 2017-01-12 MED ORDER — METRONIDAZOLE 500 MG PO TABS: 500.0000 mg | ORAL_TABLET | Freq: Two times a day (BID) | ORAL | 0 refills | Status: DC

## 2017-01-12 MED ORDER — METRONIDAZOLE 0.75 % VA GEL: 1.0000 | Freq: Every day | VAGINAL | 5 refills | Status: DC

## 2017-01-12 NOTE — Telephone Encounter (Signed)
Returned call, no answer, left vm 

## 2017-01-12 NOTE — Progress Notes (Signed)
GYNECOLOGY OFFICE VISIT NOTE  History:  26 y.o. [id number] here today for vaginal irritation, abnormal discharge and odor x 1 month. Reports frequent BV infections; Dr. Gaynell FaceMarshall was going to put on prolonged antibiotic therapy.  She also would like a comprehensive STD screen. She denies any abnormal vaginal bleeding, pelvic pain or other concerns.   Past Medical History:  Diagnosis Date  . Asthma    as a child  . History of methicillin resistant staphylococcus aureus (MRSA)   . Infection    left elbow  . Syncope   . Vaginal Pap smear, abnormal     Past Surgical History:  Procedure Laterality Date  . ELBOW SURGERY Left   . FOOT SURGERY    . GYNECOLOGIC CRYOSURGERY    . I&D EXTREMITY Left 12/03/2015   Procedure: IRRIGATION AND DEBRIDEMENT LEFT ELBOW;  Surgeon: Tarry KosNaiping M Xu, MD;  Location: [facility] OR;  Service: Orthopedics;  Laterality: Left;  . uterine biopsy      The following portions of the patient's history were reviewed and updated as appropriate: allergies, current medications, past family history, past medical history, past social history, past surgical history and problem list.   Health Maintenance:  Normal pap on 07/27/2016.    Review of Systems:  Pertinent items noted in HPI and remainder of comprehensive ROS otherwise negative.   Objective:  Physical Exam BP 113/77   Pulse 74   Wt 132 lb (59.9 kg)   BMI 22.66 kg/m  CONSTITUTIONAL: Well-developed, well-nourished female in no acute distress.  HENT:  Normocephalic, atraumatic. External right and left ear normal. Oropharynx is clear and moist EYES: Conjunctivae and EOM are normal. Pupils are equal, round, and reactive to light. No scleral icterus.  NECK: Normal range of motion, supple, no masses SKIN: Skin is warm and dry. No rash noted. Not diaphoretic. No erythema. No pallor. NEUROLOGIC: Alert and oriented to person, place, and time. Normal reflexes, muscle tone coordination. No cranial nerve deficit noted. PSYCHIATRIC:  Normal mood and affect. Normal behavior. Normal judgment and thought content. CARDIOVASCULAR: Normal heart rate noted RESPIRATORY: Effort and breath sounds normal, no problems with respiration noted ABDOMEN: Soft, no distention noted.   PELVIC: Normal appearing external genitalia; normal appearing vaginal mucosa and cervix.  Copious, thick, white-yellow, malodorous abnormal discharge noted; testing sample obtained.  Normal uterine size, no other palpable masses, no uterine or adnexal tenderness. MUSCULOSKELETAL: Normal range of motion. No edema noted.   Assessment & Plan:  1. Vaginal discharge Possible BV and yeast.  Metronidazole and Diflucan presumptively prescribed. Will follow up results and manage accordingly. - Cervicovaginal ancillary only - metroNIDAZOLE (FLAGYL) 500 MG tablet; Take 1 tablet (500 mg total) by mouth 2 (two) times daily.  Dispense: 14 tablet; Refill: 0 - fluconazole (DIFLUCAN) 150 MG tablet; Take 1 tablet (150 mg total) by mouth once. Can take additional dose three days later if symptoms persist  Dispense: 1 tablet; Refill: 3  2. Recurrent vaginitis Extended metronidazole therapy prescribed prescribed for recurrent BV - metroNIDAZOLE (METROGEL) 0.75 % vaginal gel; Place 1 Applicatorful vaginally at bedtime. Apply one applicatorful to vagina twice a week for 6 months. Start after taking pills.  Dispense: 70 g; Refill: 5 Proper vulvar hygiene emphasized: discussed avoidance of perfumed soaps, detergents, lotions and any type of douches; in addition to wearing cotton underwear and no underwear at night.  Also recommended cleaning front to back, voiding and cleaning up after intercourse.   3. Routine screening for STI (sexually transmitted infection) - Cervicovaginal  ancillary only - Hepatitis B surface antigen - Hepatitis C antibody - HIV antibody - RPR Will follow up results and manage accordingly.  Routine preventative health maintenance measures emphasized. Please  refer to After Visit Summary for other counseling recommendations.   Return if symptoms worsen or fail to improve.   Total face-to-face time with patient: 15 minutes. Over 50% of encounter was spent on counseling and coordination of care.   Jaynie Collins, MD, FACOG Attending Obstetrician & Gynecologist, [facility] for [employer], [facility]

## 2017-01-12 NOTE — Patient Instructions (Signed)
Vaginitis Vaginitis is an inflammation of the vagina. It is most often caused by a change in the normal balance of the bacteria and yeast that live in the vagina. This change in balance causes an overgrowth of certain bacteria or yeast, which causes the inflammation. There are different types of vaginitis, but the most common types are:  Bacterial vaginosis.  Yeast infection (candidiasis).  Trichomoniasis vaginitis. This is a sexually transmitted infection (STI).  Viral vaginitis.  Atrophic vaginitis.  Allergic vaginitis. What are the causes? The cause depends on the type of vaginitis. Vaginitis can be caused by:  Bacteria (bacterial vaginosis).  Yeast (yeast infection).  A parasite (trichomoniasis vaginitis)  A virus (viral vaginitis).  Low hormone levels (atrophic vaginitis). Low hormone levels can occur during pregnancy, breastfeeding, or after menopause.  Irritants, such as bubble baths, scented tampons, and feminine sprays (allergic vaginitis). Other factors can change the normal balance of the yeast and bacteria that live in the vagina. These include:  Antibiotic medicines.  Poor hygiene.  Diaphragms, vaginal sponges, spermicides, birth control pills, and intrauterine devices (IUD).  Sexual intercourse.  Infection.  Uncontrolled diabetes.  A weakened immune system. What are the signs or symptoms? Symptoms can vary depending on the cause of the vaginitis. Common symptoms include:  Abnormal vaginal discharge.  The discharge is white, gray, or yellow with bacterial vaginosis.  The discharge is thick, white, and cheesy with a yeast infection.  The discharge is frothy and yellow or greenish with trichomoniasis.  A bad vaginal odor.  The odor is fishy with bacterial vaginosis.  Vaginal itching, pain, or swelling.  Painful intercourse.  Pain or burning when urinating. Sometimes there are no symptoms. How is this treated? Treatment will vary depending on  the type of infection.  Bacterial vaginosis and trichomoniasis are often treated with antibiotic creams or pills.  Yeast infections are often treated with antifungal medicines, such as vaginal creams or suppositories.  Viral vaginitis has no cure, but symptoms can be treated with medicines that relieve discomfort. Your sexual partner should be treated as well.  Atrophic vaginitis may be treated with an estrogen cream, pill, suppository, or vaginal ring. If vaginal dryness occurs, lubricants and moisturizing creams may help. You may be told to avoid scented soaps, sprays, or douches.  Allergic vaginitis treatment involves quitting the use of the product that is causing the problem. Vaginal creams can be used to treat the symptoms. Follow these instructions at home:  Take all medicines as directed by your caregiver.  Keep your genital area clean and dry. Avoid soap and only rinse the area with water.  Avoid douching. It can remove the healthy bacteria in the vagina.  Do not use tampons or have sexual intercourse until your vaginitis has been treated. Use sanitary pads while you have vaginitis.  Wipe from front to back. This avoids the spread of bacteria from the rectum to the vagina.  Let air reach your genital area. ? Wear cotton underwear to decrease moisture buildup.  Avoid wearing underwear while you sleep until your vaginitis is gone.  Avoid tight pants and underwear or nylons without a cotton panel.  Take off wet clothing (especially bathing suits) as soon as possible.  Use mild, non-scented products. Avoid using irritants, such as:  Scented feminine sprays.  Fabric softeners.  Scented detergents.  Scented tampons.  Scented soaps or bubble baths.  Practice safe sex and use condoms. Condoms may prevent the spread of trichomoniasis and viral vaginitis. Contact a health care   provider if:  You have abdominal pain.  You have symptoms that last for more than 2-3  days.  You have a fever and your symptoms suddenly get worse. This information is not intended to replace advice given to you by your health care provider. Make sure you discuss any questions you have with your health care provider. Document Released: 08/21/2007 Document Revised: 09/14/2016 Document Reviewed: 09/14/2016 Elsevier Interactive Patient Education  2017 [employer]vier [employer].  

## 2017-01-13 LAB — CERVICOVAGINAL ANCILLARY ONLY
Bacterial vaginitis: POSITIVE — AB
CHLAMYDIA, DNA PROBE: NEGATIVE
Candida vaginitis: POSITIVE — AB
Neisseria Gonorrhea: NEGATIVE
Trichomonas: NEGATIVE

## 2017-01-13 LAB — RPR: RPR: NONREACTIVE

## 2017-01-13 LAB — HIV ANTIBODY (ROUTINE TESTING W REFLEX): HIV SCREEN 4TH GENERATION: NONREACTIVE

## 2017-01-13 LAB — HEPATITIS B SURFACE ANTIGEN: Hepatitis B Surface Ag: NEGATIVE

## 2017-01-13 LAB — HEPATITIS C ANTIBODY: Hep C Virus Ab: <0.1 s/co ratio (ref 0.0–0.9)

## 2017-01-19 ENCOUNTER — Telehealth: Payer: Self-pay | Admitting: *Deleted

## 2017-01-19 NOTE — Telephone Encounter (Signed)
Pt called in and stated she has just talked to you stated a prescription was supposed to be sent to the pharmacy for her last week and it was not sent, please advise.Marland Kitchen..Marland Kitchen

## 2017-01-19 NOTE — Telephone Encounter (Signed)
Spoke with patient reviewed her medications and she is clear on how to take then now.

## 2017-02-23 ENCOUNTER — Other Ambulatory Visit (HOSPITAL_COMMUNITY)
Admission: RE | Admit: 2017-02-23 | Discharge: 2017-02-23 | Disposition: A | Discharge status: Home / Self Care | Payer: Medicaid Other | Source: Ambulatory Visit | Attending: Obstetrics and Gynecology | Admitting: Obstetrics and Gynecology

## 2017-02-23 ENCOUNTER — Ambulatory Visit (INDEPENDENT_AMBULATORY_CARE_PROVIDER_SITE_OTHER): Payer: Medicaid Other | Admitting: Obstetrics and Gynecology

## 2017-02-23 ENCOUNTER — Encounter: Payer: Self-pay | Admitting: Obstetrics and Gynecology

## 2017-02-23 VITALS — BP 108/76 | HR 94 | Wt 137.0 lb

## 2017-02-23 DIAGNOSIS — N760 Acute vaginitis: Secondary | ICD-10-CM | POA: Diagnosis not present

## 2017-02-23 DIAGNOSIS — Z113 Encounter for screening for infections with a predominantly sexual mode of transmission: Secondary | ICD-10-CM | POA: Diagnosis not present

## 2017-02-23 DIAGNOSIS — B9689 Other specified bacterial agents as the cause of diseases classified elsewhere: Secondary | ICD-10-CM | POA: Insufficient documentation

## 2017-02-23 NOTE — Progress Notes (Signed)
Patient thinks she has an infections. She has discharge with odor.

## 2017-02-23 NOTE — Progress Notes (Signed)
26 yo [id number] presenting for the evaluation of 3-day history of non-pruritic vaginal discharge with odor. Patient is sexually active using condoms for contraceptions. She is interested in STD screen. She is not interested in other method of contraception at this time. Patient with normal pap smear in 07/2016  Past Medical History:  Diagnosis Date  . Asthma    as a child  . History of methicillin resistant staphylococcus aureus (MRSA)   . Infection    left elbow  . Syncope   . Vaginal Pap smear, abnormal    Past Surgical History:  Procedure Laterality Date  . ELBOW SURGERY Left   . FOOT SURGERY    . GYNECOLOGIC CRYOSURGERY    . I&D EXTREMITY Left 12/03/2015   Procedure: IRRIGATION AND DEBRIDEMENT LEFT ELBOW;  Surgeon: Tarry Kos, MD;  Location: [facility] OR;  Service: Orthopedics;  Laterality: Left;  . uterine biopsy     Family History  Problem Relation Age of Onset  . Alcohol abuse Neg Hx   . Arthritis Neg Hx   . Asthma Neg Hx   . Birth defects Neg Hx   . COPD Neg Hx   . Cancer Neg Hx   . Depression Neg Hx   . Diabetes Neg Hx   . Drug abuse Neg Hx   . Early death Neg Hx   . Hearing loss Neg Hx   . Heart disease Neg Hx   . Hyperlipidemia Neg Hx   . Hypertension Neg Hx   . Kidney disease Neg Hx   . Learning disabilities Neg Hx   . Mental illness Neg Hx   . Mental retardation Neg Hx   . Miscarriages / Stillbirths Neg Hx   . Stroke Neg Hx   . Vision loss Neg Hx    Social History  Substance Use Topics  . Smoking status: Never Smoker  . Smokeless tobacco: Never Used  . Alcohol use No   ROS See pertinent in HPI  Blood pressure 108/76, pulse 94, weight 137 lb (62.1 kg), last menstrual period 01/23/2017. GENERAL: Well-developed, well-nourished female in no acute distress.  ABDOMEN: Soft, nontender, nondistended. No organomegaly. PELVIC: Normal external female genitalia. Vagina is pink and rugated.  Normal discharge. Normal appearing cervix. Uterus is normal in size. No  adnexal mass or tenderness. EXTREMITIES: No cyanosis, clubbing, or edema, 2+ distal pulses.  A/P 26 yo with acute vaginitis - STD screen performed - wet prep collected - patient will be contacted with results and management plan - RTC in fall for annual exam

## 2017-02-24 ENCOUNTER — Other Ambulatory Visit: Payer: Self-pay | Admitting: Obstetrics and Gynecology

## 2017-02-24 ENCOUNTER — Telehealth: Payer: Self-pay

## 2017-02-24 LAB — HEPATITIS B SURFACE ANTIGEN: Hepatitis B Surface Ag: NEGATIVE

## 2017-02-24 LAB — CERVICOVAGINAL ANCILLARY ONLY
BACTERIAL VAGINITIS: POSITIVE — AB
CHLAMYDIA, DNA PROBE: NEGATIVE
Candida vaginitis: NEGATIVE
NEISSERIA GONORRHEA: NEGATIVE
Trichomonas: NEGATIVE

## 2017-02-24 LAB — HEPATITIS C ANTIBODY: Hep C Virus Ab: <0.1 s/co ratio (ref 0.0–0.9)

## 2017-02-24 LAB — RPR: RPR: NONREACTIVE

## 2017-02-24 LAB — HIV ANTIBODY (ROUTINE TESTING W REFLEX): HIV Screen 4th Generation wRfx: NONREACTIVE

## 2017-02-24 MED ORDER — METRONIDAZOLE 500 MG PO TABS
500.0000 mg | ORAL_TABLET | Freq: Two times a day (BID) | ORAL | 0 refills | Status: DC
Start: 2017-02-24 — End: 2017-04-20

## 2017-02-24 NOTE — Telephone Encounter (Signed)
-----   Message from Catalina Antigua, MD sent at 02/24/2017 12:44 PM EDT ----- Please inform the patient of BV infection. Rx e-prescribed  [employer]

## 2017-02-24 NOTE — Telephone Encounter (Signed)
Pt aware of BV and rx. Questions answered regarding recurrent BV infections.

## 2017-03-23 ENCOUNTER — Other Ambulatory Visit (HOSPITAL_COMMUNITY)
Admission: RE | Admit: 2017-03-23 | Discharge: 2017-03-23 | Disposition: A | Discharge status: Home / Self Care | Payer: Medicaid Other | Source: Ambulatory Visit | Attending: Obstetrics and Gynecology | Admitting: Obstetrics and Gynecology

## 2017-03-23 ENCOUNTER — Encounter: Payer: Self-pay | Admitting: Obstetrics and Gynecology

## 2017-03-23 ENCOUNTER — Ambulatory Visit (INDEPENDENT_AMBULATORY_CARE_PROVIDER_SITE_OTHER): Payer: Medicaid Other | Admitting: Obstetrics and Gynecology

## 2017-03-23 VITALS — BP 109/62 | HR 82 | Wt 134.8 lb

## 2017-03-23 DIAGNOSIS — Z113 Encounter for screening for infections with a predominantly sexual mode of transmission: Secondary | ICD-10-CM

## 2017-03-23 DIAGNOSIS — N760 Acute vaginitis: Secondary | ICD-10-CM | POA: Diagnosis not present

## 2017-03-23 NOTE — Progress Notes (Signed)
26 yo [id number] here for the evaluation of a odorless, pruritic vaginal discharge present for the past few days. Patient is sexually active using condoms for contraception. She denies any pelvic pain. She has hasd a normal pap smear in 07/2016  Past Medical History:  Diagnosis Date  . Asthma    as a child  . History of methicillin resistant staphylococcus aureus (MRSA)   . Infection    left elbow  . Syncope   . Vaginal Pap smear, abnormal    Past Surgical History:  Procedure Laterality Date  . ELBOW SURGERY Left   . FOOT SURGERY    . GYNECOLOGIC CRYOSURGERY    . I&D EXTREMITY Left 12/03/2015   Procedure: IRRIGATION AND DEBRIDEMENT LEFT ELBOW;  Surgeon: Tarry KosNaiping M Xu, MD;  Location: [facility] OR;  Service: Orthopedics;  Laterality: Left;  . uterine biopsy     Family History  Problem Relation Age of Onset  . Alcohol abuse Neg Hx   . Arthritis Neg Hx   . Asthma Neg Hx   . Birth defects Neg Hx   . COPD Neg Hx   . Cancer Neg Hx   . Depression Neg Hx   . Diabetes Neg Hx   . Drug abuse Neg Hx   . Early death Neg Hx   . Hearing loss Neg Hx   . Heart disease Neg Hx   . Hyperlipidemia Neg Hx   . Hypertension Neg Hx   . Kidney disease Neg Hx   . Learning disabilities Neg Hx   . Mental illness Neg Hx   . Mental retardation Neg Hx   . Miscarriages / Stillbirths Neg Hx   . Stroke Neg Hx   . Vision loss Neg Hx    Social History  Substance Use Topics  . Smoking status: Never Smoker  . Smokeless tobacco: Never Used  . Alcohol use No   ROS See pertinent in HPI  Blood pressure 109/62, pulse 82, weight 134 lb 12.8 oz (61.1 kg), last menstrual period 03/08/2017. GENERAL: Well-developed, well-nourished female in no acute distress.  ABDOMEN: Soft, nontender, nondistended. No organomegaly. PELVIC: Normal external female genitalia. Vagina is pink and rugated.  Normal discharge. Normal appearing cervix. Uterus is normal in size. No adnexal mass or tenderness. EXTREMITIES: No cyanosis, clubbing,  or edema, 2+ distal pulses.  A/P 26 yo with vaginitis - Wet prep and cultures collected - Patient will be contacted with any abnormal results - patient declined contraception for now

## 2017-03-23 NOTE — Progress Notes (Signed)
Patient presents for possible yeast infection. Has discharge with no odor. Denies nausea and pain.

## 2017-03-24 LAB — CERVICOVAGINAL ANCILLARY ONLY
BACTERIAL VAGINITIS: POSITIVE — AB
Candida vaginitis: NEGATIVE
Chlamydia: NEGATIVE
NEISSERIA GONORRHEA: NEGATIVE
TRICH (WINDOWPATH): NEGATIVE

## 2017-03-27 ENCOUNTER — Telehealth: Payer: Self-pay | Admitting: *Deleted

## 2017-03-27 ENCOUNTER — Other Ambulatory Visit: Payer: Self-pay | Admitting: Obstetrics and Gynecology

## 2017-03-27 MED ORDER — METRONIDAZOLE 500 MG PO TABS: 500.0000 mg | ORAL_TABLET | Freq: Two times a day (BID) | ORAL | 0 refills | Status: DC

## 2017-03-27 NOTE — Telephone Encounter (Signed)
-----   Message from Catalina AntiguaPeggy Constant, MD sent at 03/27/2017  8:48 AM EDT ----- Please inform patient of BV infection. Rx Flagyl provided.   Thanks  [employer]

## 2017-03-27 NOTE — Telephone Encounter (Signed)
Patient notified

## 2017-04-01 IMAGING — CR DG ELBOW COMPLETE 3+V*L*
4 series · 4 of 4 positions shown · non-contrast
Comparison: None.

CLINICAL DATA: Left elbow pain and swelling, no known injury,
initial encounter

EXAM:
LEFT ELBOW - COMPLETE 3+ VIEW

[elbow ap]
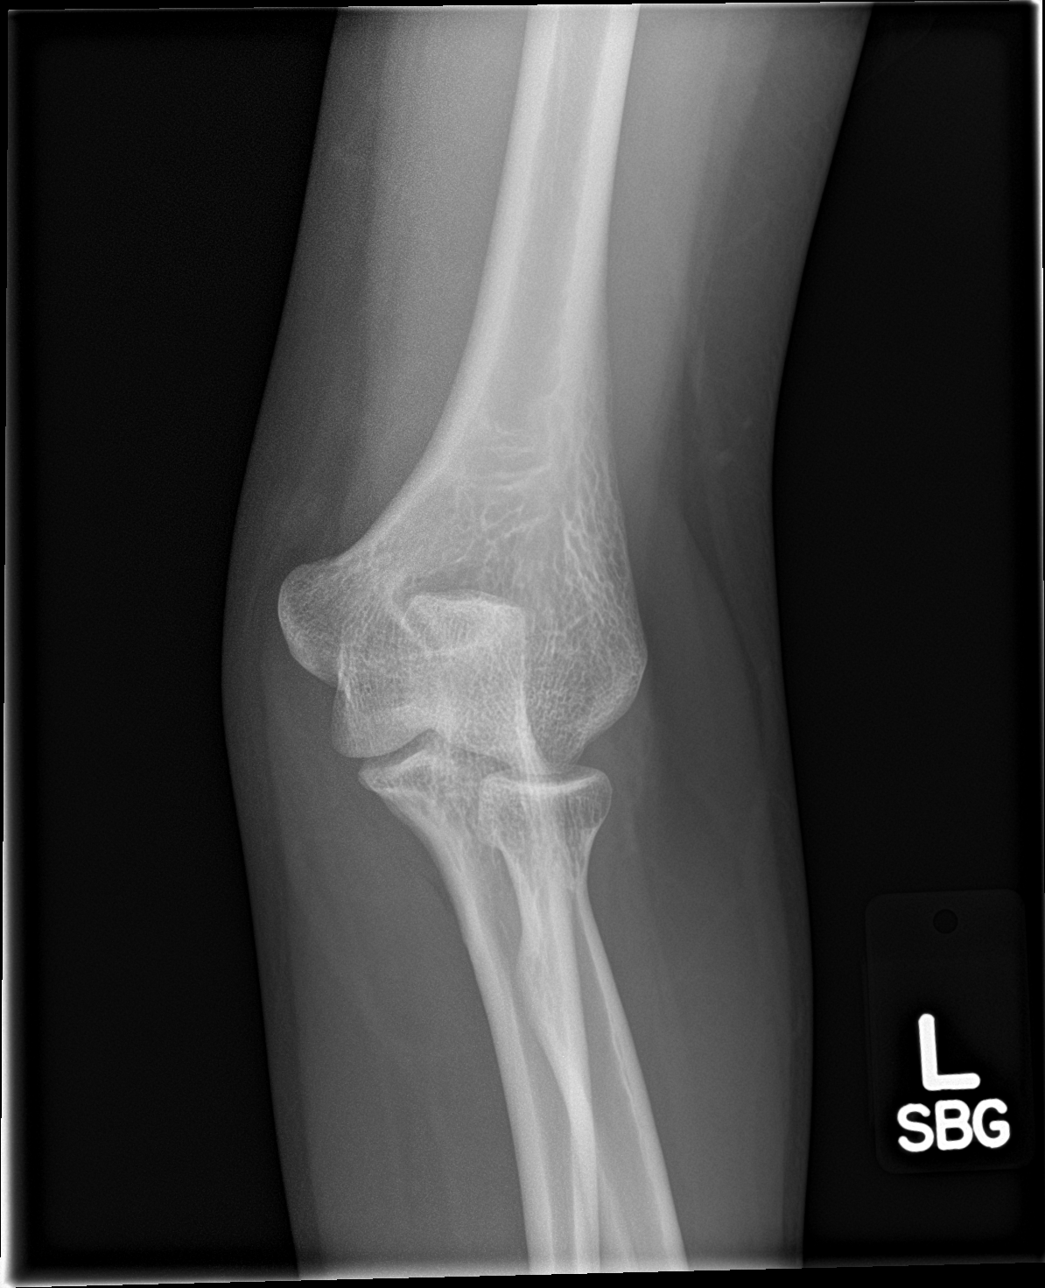

[elbow obl (1 of 2)]
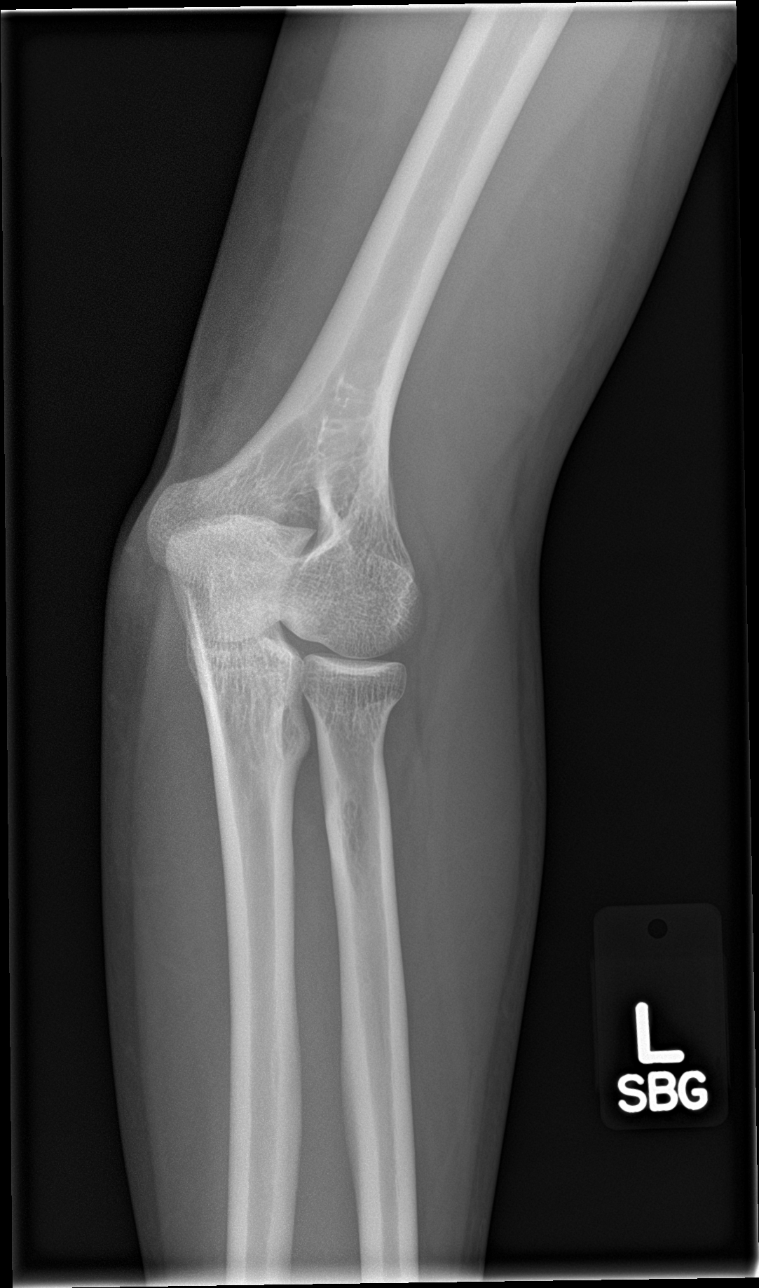

[elbow obl (2 of 2)]
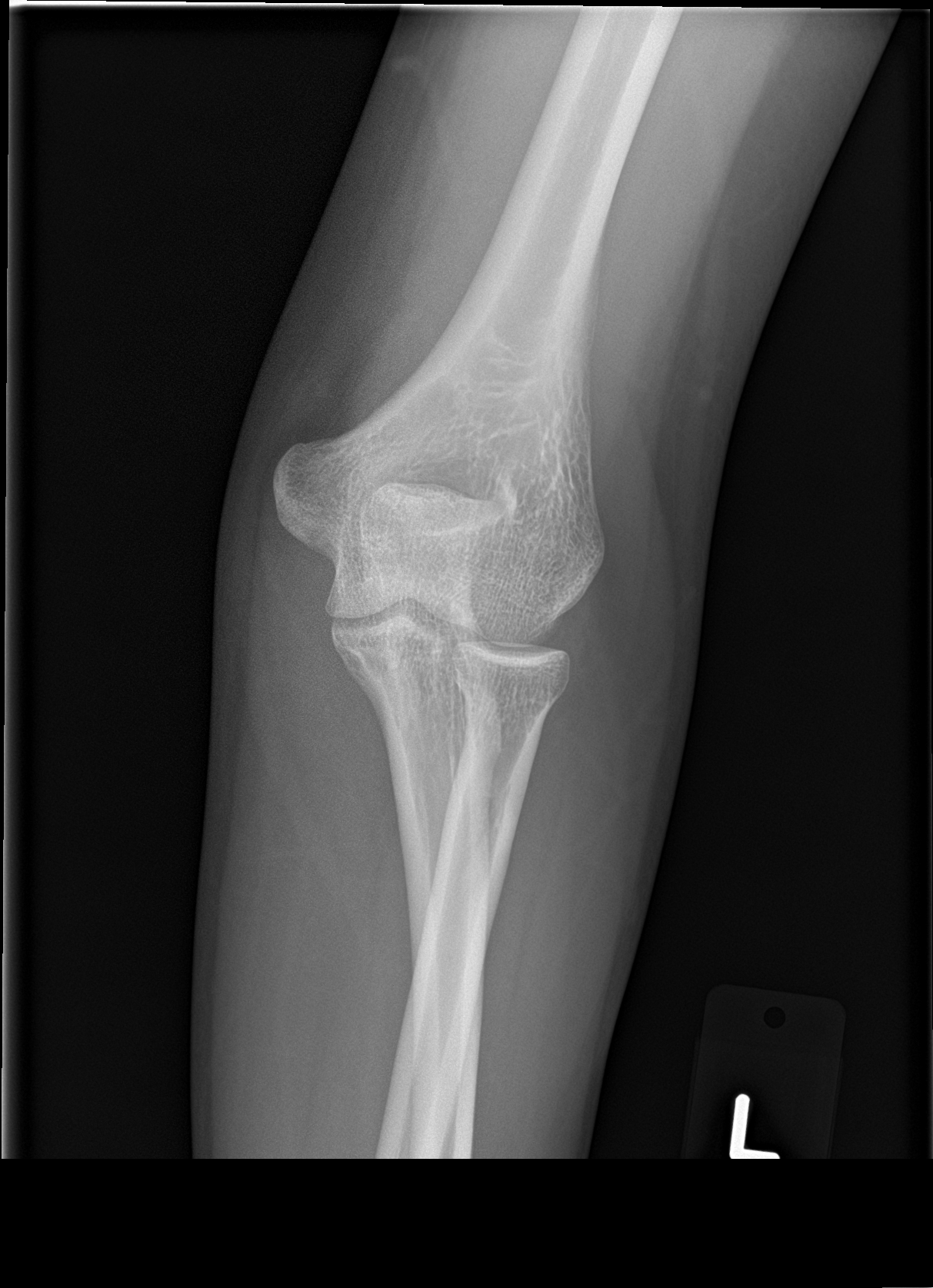

[elbow lat]
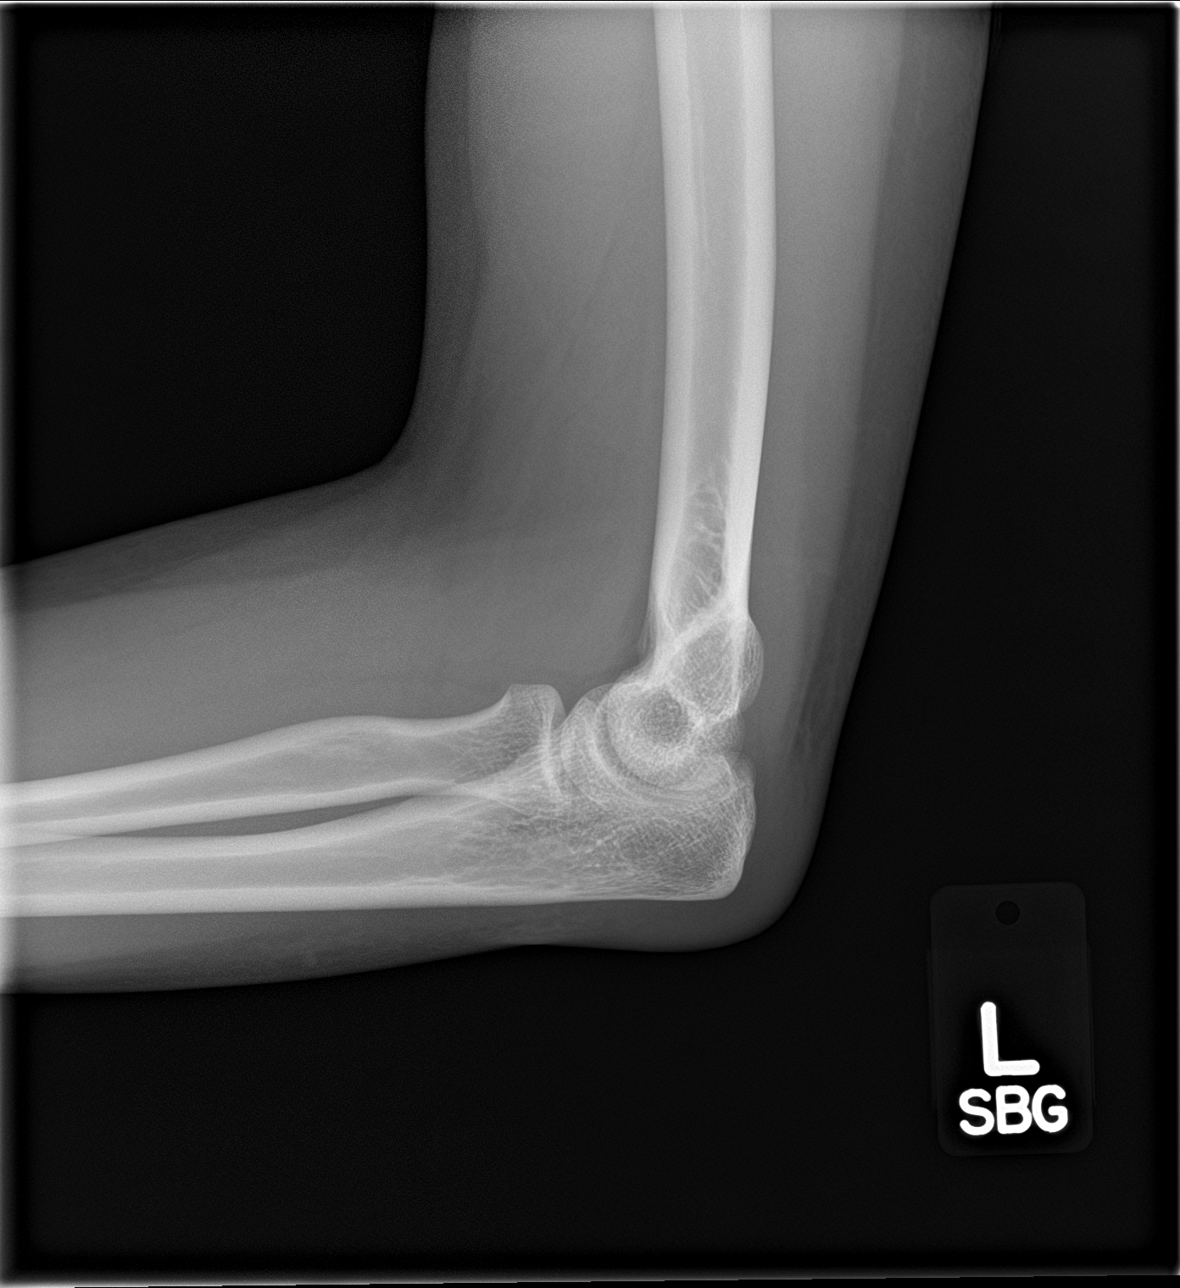

[4 of 4 positions shown; findings below may reference images not displayed]

FINDINGS: No acute fracture or dislocation is noted. Mild soft tissue swelling
is noted over the olecranon process. No significant joint effusion
is seen.
IMPRESSION: Mild soft tissue swelling without acute bony abnormality.

## 2017-04-20 ENCOUNTER — Ambulatory Visit (INDEPENDENT_AMBULATORY_CARE_PROVIDER_SITE_OTHER): Payer: Medicaid Other | Admitting: Obstetrics

## 2017-04-20 ENCOUNTER — Other Ambulatory Visit (HOSPITAL_COMMUNITY)
Admission: RE | Admit: 2017-04-20 | Discharge: 2017-04-20 | Disposition: A | Discharge status: Home / Self Care | Payer: Medicaid Other | Source: Ambulatory Visit | Attending: Obstetrics | Admitting: Obstetrics

## 2017-04-20 ENCOUNTER — Encounter: Payer: Self-pay | Admitting: Obstetrics

## 2017-04-20 VITALS — BP 114/72 | HR 72 | Wt 137.8 lb

## 2017-04-20 DIAGNOSIS — N760 Acute vaginitis: Secondary | ICD-10-CM

## 2017-04-20 DIAGNOSIS — N898 Other specified noninflammatory disorders of vagina: Secondary | ICD-10-CM | POA: Insufficient documentation

## 2017-04-20 DIAGNOSIS — B9689 Other specified bacterial agents as the cause of diseases classified elsewhere: Secondary | ICD-10-CM | POA: Diagnosis not present

## 2017-04-20 MED ORDER — METRONIDAZOLE 500 MG PO TABS: 500.0000 mg | ORAL_TABLET | Freq: Two times a day (BID) | ORAL | 2 refills | Status: DC

## 2017-04-20 NOTE — Progress Notes (Signed)
Patient ID: Rachael Oliver, female   DOB: 1991/04/29, 26 y.o.   MRN: [id number]  Chief Complaint  Patient presents with  . Gynecologic Exam    HPI Rachael Oliver is a 26 y.o. female.  Malodorous vaginal discharge HPI  Past Medical History:  Diagnosis Date  . Asthma    as a child  . History of methicillin resistant staphylococcus aureus (MRSA)   . Infection    left elbow  . Syncope   . Vaginal Pap smear, abnormal     Past Surgical History:  Procedure Laterality Date  . ELBOW SURGERY Left   . FOOT SURGERY    . GYNECOLOGIC CRYOSURGERY    . I&D EXTREMITY Left 12/03/2015   Procedure: IRRIGATION AND DEBRIDEMENT LEFT ELBOW;  Surgeon: Tarry KosNaiping M Xu, MD;  Location: [facility] OR;  Service: Orthopedics;  Laterality: Left;  . uterine biopsy      Family History  Problem Relation Age of Onset  . Alcohol abuse Neg Hx   . Arthritis Neg Hx   . Asthma Neg Hx   . Birth defects Neg Hx   . COPD Neg Hx   . Cancer Neg Hx   . Depression Neg Hx   . Diabetes Neg Hx   . Drug abuse Neg Hx   . Early death Neg Hx   . Hearing loss Neg Hx   . Heart disease Neg Hx   . Hyperlipidemia Neg Hx   . Hypertension Neg Hx   . Kidney disease Neg Hx   . Learning disabilities Neg Hx   . Mental illness Neg Hx   . Mental retardation Neg Hx   . Miscarriages / Stillbirths Neg Hx   . Stroke Neg Hx   . Vision loss Neg Hx     Social History Social History  Substance Use Topics  . Smoking status: Never Smoker  . Smokeless tobacco: Never Used  . Alcohol use No    No Known Allergies  Current Outpatient Prescriptions  Medication Sig Dispense Refill  . metroNIDAZOLE (FLAGYL) 500 MG tablet Take 1 tablet (500 mg total) by mouth 2 (two) times daily. 14 tablet 2  . valACYclovir (VALTREX) 1000 MG tablet Take 2 tablets (2,000 mg total) by mouth 2 (two) times daily. (Patient not taking: Reported on 10/06/2016) 4 tablet 0   No current facility-administered medications for this visit.     Review of Systems Review of  Systems Constitutional: negative for fatigue and weight loss Respiratory: negative for cough and wheezing Cardiovascular: negative for chest pain, fatigue and palpitations Gastrointestinal: negative for abdominal pain and change in bowel habits Genitourinary:positive for malodorous vaginal discharge Integument/breast: negative for nipple discharge Musculoskeletal:negative for myalgias Neurological: negative for gait problems and tremors Behavioral/Psych: negative for abusive relationship, depression Endocrine: negative for temperature intolerance      Blood pressure 114/72, pulse 72, weight 137 lb 12.8 oz (62.5 kg), last menstrual period 04/07/2017.  Physical Exam Physical Exam           General:  Alert and no distress Abdomen:  normal findings: no organomegaly, soft, non-tender and no hernia  Pelvis:  External genitalia: normal general appearance Urinary system: urethral meatus normal and bladder without fullness, nontender Vaginal: normal without tenderness, induration or masses Cervix: normal appearance Adnexa: normal bimanual exam Uterus: anteverted and non-tender, normal size    50% of 15 min visit spent on counseling and coordination of care.    Data Reviewed Labs  Assessment     1. Vaginal discharge Rx: -  Cervicovaginal ancillary only  2. BV (bacterial vaginosis) Rx - metroNIDAZOLE (FLAGYL) 500 MG tablet; Take 1 tablet (500 mg total) by mouth 2 (two) times daily.  Dispense: 14 tablet; Refill: 2    Plan    Follow up in 4 months for Annual No orders of the defined types were placed in this encounter.  Meds ordered this encounter  Medications  . metroNIDAZOLE (FLAGYL) 500 MG tablet    Sig: Take 1 tablet (500 mg total) by mouth 2 (two) times daily.    Dispense:  14 tablet    Refill:  2         Patient ID: Rachael Oliver, female   DOB: 05/05/1991, 26 y.o.   MRN: [id number]

## 2017-04-20 NOTE — Patient Instructions (Addendum)
Bacterial Vaginosis Bacterial vaginosis is a vaginal infection that occurs when the normal balance of bacteria in the vagina is disrupted. It results from an overgrowth of certain bacteria. This is the most common vaginal infection among women ages 15-44. Because bacterial vaginosis increases your risk for STIs (sexually transmitted infections), getting treated can help reduce your risk for chlamydia, gonorrhea, herpes, and HIV (human immunodeficiency virus). Treatment is also important for preventing complications in pregnant women, because this condition can cause an early (premature) delivery. What are the causes? This condition is caused by an increase in harmful bacteria that are normally present in small amounts in the vagina. However, the reason that the condition develops is not fully understood. What increases the risk? The following factors may make you more likely to develop this condition:  Having a new sexual partner or multiple sexual partners.  Having unprotected sex.  Douching.  Having an intrauterine device (IUD).  Smoking.  Drug and alcohol abuse.  Taking certain antibiotic medicines.  Being pregnant.  You cannot get bacterial vaginosis from toilet seats, bedding, swimming pools, or contact with objects around you. What are the signs or symptoms? Symptoms of this condition include:  Grey or white vaginal discharge. The discharge can also be watery or foamy.  A fish-like odor with discharge, especially after sexual intercourse or during menstruation.  Itching in and around the vagina.  Burning or pain with urination.  Some women with bacterial vaginosis have no signs or symptoms. How is this diagnosed? This condition is diagnosed based on:  Your medical history.  A physical exam of the vagina.  Testing a sample of vaginal fluid under a microscope to look for a large amount of bad bacteria or abnormal cells. Your health care provider may use a cotton swab  or a small wooden spatula to collect the sample.  How is this treated? This condition is treated with antibiotics. These may be given as a pill, a vaginal cream, or a medicine that is put into the vagina (suppository). If the condition comes back after treatment, a second round of antibiotics may be needed. Follow these instructions at home: Medicines  Take over-the-counter and prescription medicines only as told by your health care provider.  Take or use your antibiotic as told by your health care provider. Do not stop taking or using the antibiotic even if you start to feel better. General instructions  If you have a female sexual partner, tell her that you have a vaginal infection. She should see her health care provider and be treated if she has symptoms. If you have a female sexual partner, he does not need treatment.  During treatment: ? Avoid sexual activity until you finish treatment. ? Do not douche. ? Avoid alcohol as directed by your health care provider. ? Avoid breastfeeding as directed by your health care provider.  Drink enough water and fluids to keep your urine clear or pale yellow.  Keep the area around your vagina and rectum clean. ? Wash the area daily with warm water. ? Wipe yourself from front to back after using the toilet.  Keep all follow-up visits as told by your health care provider. This is important. How is this prevented?  Do not douche.  Wash the outside of your vagina with warm water only.  Use protection when having sex. This includes latex condoms and dental dams.  Limit how many sexual partners you have. To help prevent bacterial vaginosis, it is best to have sex with just   one partner (monogamous).  Make sure you and your sexual partner are tested for STIs.  Wear cotton or cotton-lined underwear.  Avoid wearing tight pants and pantyhose, especially during summer.  Limit the amount of alcohol that you drink.  Do not use any products that  contain nicotine or tobacco, such as cigarettes and e-cigarettes. If you need help quitting, ask your health care provider.  Do not use illegal drugs. Where to find more information:  Centers for Disease Control and Prevention: www.cdc.gov/std  American Sexual Health Association (ASHA): www.ashastd.org  U.S. Department of Health and Human Services, Office on Women's Health: www.womenshealth.gov/ or https://www.womenshealth.gov/a-z-topics/bacterial-vaginosis Contact a health care provider if:  Your symptoms do not improve, even after treatment.  You have more discharge or pain when urinating.  You have a fever.  You have pain in your abdomen.  You have pain during sex.  You have vaginal bleeding between periods. Summary  Bacterial vaginosis is a vaginal infection that occurs when the normal balance of bacteria in the vagina is disrupted.  Because bacterial vaginosis increases your risk for STIs (sexually transmitted infections), getting treated can help reduce your risk for chlamydia, gonorrhea, herpes, and HIV (human immunodeficiency virus). Treatment is also important for preventing complications in pregnant women, because the condition can cause an early (premature) delivery.  This condition is treated with antibiotic medicines. These may be given as a pill, a vaginal cream, or a medicine that is put into the vagina (suppository). This information is not intended to replace advice given to you by your health care provider. Make sure you discuss any questions you have with your health care provider. Document Released: 10/24/2005 Document Revised: 07/09/2016 Document Reviewed: 07/09/2016 Elsevier Interactive Patient Education  2017 [employer]  

## 2017-04-20 NOTE — Progress Notes (Signed)
Patient is in the office today for wet prep. Patient reports discharge for 3 days and thinks she has BV.

## 2017-04-21 LAB — CERVICOVAGINAL ANCILLARY ONLY
Bacterial vaginitis: POSITIVE — AB
CANDIDA VAGINITIS: POSITIVE — AB
Chlamydia: NEGATIVE
Neisseria Gonorrhea: NEGATIVE
TRICH (WINDOWPATH): NEGATIVE

## 2017-04-22 ENCOUNTER — Other Ambulatory Visit: Payer: Self-pay | Admitting: Obstetrics

## 2017-04-22 DIAGNOSIS — B3731 Acute candidiasis of vulva and vagina: Secondary | ICD-10-CM

## 2017-04-22 DIAGNOSIS — B373 Candidiasis of vulva and vagina: Secondary | ICD-10-CM

## 2017-04-22 MED ORDER — FLUCONAZOLE 150 MG PO TABS: 150.0000 mg | ORAL_TABLET | Freq: Once | ORAL | 0 refills | Status: AC

## 2017-05-04 ENCOUNTER — Telehealth: Payer: Self-pay | Admitting: *Deleted

## 2017-05-04 NOTE — Telephone Encounter (Signed)
Pt called to office stating she would like appt to get Birth control shot. Pt has not been on depo in a while. Pt advised message to be sent to provider for new Rx for depo and should expect return call. Pt states that she is on her cycle now and would like to get injection while on cycle, states usu last 7 days.    Please send Depo Rx to pharmacy if approved for pt to start injections.

## 2017-05-05 ENCOUNTER — Other Ambulatory Visit: Payer: Self-pay | Admitting: Obstetrics

## 2017-05-05 DIAGNOSIS — Z30013 Encounter for initial prescription of injectable contraceptive: Secondary | ICD-10-CM

## 2017-05-05 MED ORDER — MEDROXYPROGESTERONE ACETATE 150 MG/ML IM SUSP: 150.0000 mg | INTRAMUSCULAR | 0 refills | Status: DC

## 2017-05-05 NOTE — Telephone Encounter (Signed)
Depo Provera Rx 

## 2017-05-08 NOTE — Telephone Encounter (Signed)
Pt aware and scheduled for appt.

## 2017-05-11 ENCOUNTER — Ambulatory Visit: Payer: Medicaid Other

## 2017-06-23 ENCOUNTER — Encounter (HOSPITAL_COMMUNITY): Payer: Self-pay | Admitting: *Deleted

## 2017-06-23 ENCOUNTER — Inpatient Hospital Stay (HOSPITAL_COMMUNITY)
Admission: AD | Admit: 2017-06-23 | Discharge: 2017-06-23 | Disposition: A | Discharge status: Home / Self Care | Payer: Medicaid Other | Source: Ambulatory Visit | Attending: Obstetrics and Gynecology | Admitting: Obstetrics and Gynecology

## 2017-06-23 DIAGNOSIS — Z3202 Encounter for pregnancy test, result negative: Secondary | ICD-10-CM | POA: Diagnosis not present

## 2017-06-23 DIAGNOSIS — N898 Other specified noninflammatory disorders of vagina: Secondary | ICD-10-CM | POA: Diagnosis present

## 2017-06-23 LAB — URINALYSIS, ROUTINE W REFLEX MICROSCOPIC
Bilirubin Urine: NEGATIVE
GLUCOSE, UA: NEGATIVE mg/dL
Hgb urine dipstick: NEGATIVE
Ketones, ur: NEGATIVE mg/dL
Nitrite: NEGATIVE
PH: 6 (ref 5.0–8.0)
Protein, ur: NEGATIVE mg/dL
SPECIFIC GRAVITY, URINE: 1.019 (ref 1.005–1.030)

## 2017-06-23 LAB — WET PREP, GENITAL
CLUE CELLS WET PREP: NONE SEEN
SPERM: NONE SEEN
TRICH WET PREP: NONE SEEN
Yeast Wet Prep HPF POC: NONE SEEN

## 2017-06-23 LAB — POCT PREGNANCY, URINE: PREG TEST UR: NEGATIVE

## 2017-06-23 NOTE — MAU Note (Signed)
Pt presents w/ c/o vaginal iritation &  discharge that began 2 days ago.  Pt reports discharge white/yellowish color, denies odor.

## 2017-06-23 NOTE — Discharge Instructions (Signed)
You can soak in a tub of warm water that is waist high with 1/4 cup to 1/2 cup of baking soda mixed in for 20 mins x 2 days. You can use Monistat-7 to irritated outside irritated vaginal area. Call to make an appointment with [facility], if not resolved in 2 weeks.

## 2017-06-23 NOTE — MAU Provider Note (Signed)
History     CSN: [id number]  Arrival date and time: 06/23/17 1045   First Provider Initiated Contact with Patient 06/23/17 1145      Chief Complaint  Patient presents with  . Vaginal Discharge   HPI  [name] is a 26 yo [phone] non-pregnant female presenting to MAU with complaints of yellowish d/c and vaginal irritation that is causing swelling of vaginal area.  She reports that she has a h/o BV and "feels like she has it again".   Past Medical History:  Diagnosis Date  . Asthma    as a child  . History of methicillin resistant staphylococcus aureus (MRSA)   . Infection    left elbow  . Syncope   . Vaginal Pap smear, abnormal     Past Surgical History:  Procedure Laterality Date  . ELBOW SURGERY Left   . FOOT SURGERY    . GYNECOLOGIC CRYOSURGERY    . I&D EXTREMITY Left 12/03/2015   Procedure: IRRIGATION AND DEBRIDEMENT LEFT ELBOW;  Surgeon: Tarry Kos, MD;  Location: [facility] OR;  Service: Orthopedics;  Laterality: Left;  . uterine biopsy      Family History  Problem Relation Age of Onset  . Alcohol abuse Neg Hx   . Arthritis Neg Hx   . Asthma Neg Hx   . Birth defects Neg Hx   . COPD Neg Hx   . Cancer Neg Hx   . Depression Neg Hx   . Diabetes Neg Hx   . Drug abuse Neg Hx   . Early death Neg Hx   . Hearing loss Neg Hx   . Heart disease Neg Hx   . Hyperlipidemia Neg Hx   . Hypertension Neg Hx   . Kidney disease Neg Hx   . Learning disabilities Neg Hx   . Mental illness Neg Hx   . Mental retardation Neg Hx   . Miscarriages / Stillbirths Neg Hx   . Stroke Neg Hx   . Vision loss Neg Hx     Social History  Substance Use Topics  . Smoking status: Never Smoker  . Smokeless tobacco: Never Used  . Alcohol use No    Allergies: No Known Allergies  Prescriptions Prior to Admission  Medication Sig Dispense Refill Last Dose  . medroxyPROGESTERone (DEPO-PROVERA) 150 MG/ML injection Inject 1 mL (150 mg total) into the muscle every 3 (three) months. 1 mL 0    . metroNIDAZOLE (FLAGYL) 500 MG tablet Take 1 tablet (500 mg total) by mouth 2 (two) times daily. 14 tablet 2   . valACYclovir (VALTREX) 1000 MG tablet Take 2 tablets (2,000 mg total) by mouth 2 (two) times daily. (Patient not taking: Reported on 10/06/2016) 4 tablet 0 Not Taking    Review of Systems  Constitutional: Negative.   HENT: Negative.   Eyes: Negative.   Respiratory: Negative.   Cardiovascular: Negative.   Gastrointestinal: Negative.   Endocrine: Negative.   Genitourinary: Positive for dysuria and vaginal discharge (increased thick yellow discharge).  Musculoskeletal: Negative.   Skin: Negative.   Allergic/Immunologic: Negative.   Neurological: Negative.   Hematological: Negative.   Psychiatric/Behavioral: Negative.    Physical Exam   Blood pressure 116/70, pulse 70, temperature 98.1 F (36.7 C), temperature source Oral, resp. rate 20, SpO2 100 %.  Physical Exam  Constitutional: She is oriented to person, place, and time. She appears well-developed and well-nourished.  HENT:  Head: Normocephalic.  Eyes: Pupils are equal, round, and reactive to light.  Neck:  Normal range of motion.  Cardiovascular: Normal rate, regular rhythm and normal heart sounds.   Respiratory: Effort normal and breath sounds normal.  GI: Soft. Bowel sounds are normal.  Genitourinary: Uterus normal.    Pelvic exam was performed with patient supine. Cervix exhibits discharge (copious amount of thick yellowish-green d/c).  Musculoskeletal: Normal range of motion.  Neurological: She is alert and oriented to person, place, and time.  Skin: Skin is warm and dry.  Psychiatric: She has a normal mood and affect. Her behavior is normal. Judgment and thought content normal.    MAU Course  Procedures  MDM CCUA UPT Wet Prep GC/CT HIV  Results for orders placed or performed during the hospital encounter of 06/23/17 (from the past 24 hour(s))  Wet prep, genital     Status: Abnormal    Collection Time: 06/23/17 10:51 AM  Result Value Ref Range   Yeast Wet Prep HPF POC NONE SEEN NONE SEEN   Trich, Wet Prep NONE SEEN NONE SEEN   Clue Cells Wet Prep HPF POC NONE SEEN NONE SEEN   WBC, Wet Prep HPF POC MANY (A) NONE SEEN   Sperm NONE SEEN   Pregnancy, urine POC     Status: None   Collection Time: 06/23/17 11:55 AM  Result Value Ref Range   Preg Test, Ur NEGATIVE NEGATIVE     Assessment and Plan  Vaginal irritation - Advised to soak in a tub of warm water that is waist high with 1/4 cup to 1/2 cup of baking soda mixed in for 20 mins x 2 days. - Advised to use Monistat-7 to irritated outside irritated vaginal area. - Call to make an appointment with [facility], if not resolved in 2 weeks.  Discharge home Patient verbalized an understanding of the plan of care and agrees.   Raelyn Mora, MSN, CNM 06/23/2017, 11:54 AM

## 2017-06-24 ENCOUNTER — Encounter (HOSPITAL_COMMUNITY): Payer: Self-pay | Admitting: *Deleted

## 2017-06-24 ENCOUNTER — Inpatient Hospital Stay (HOSPITAL_COMMUNITY)
Admission: AD | Admit: 2017-06-24 | Discharge: 2017-06-24 | Disposition: A | Discharge status: Home / Self Care | Payer: Medicaid Other | Source: Ambulatory Visit | Attending: Obstetrics and Gynecology | Admitting: Obstetrics and Gynecology

## 2017-06-24 DIAGNOSIS — Z8614 Personal history of Methicillin resistant Staphylococcus aureus infection: Secondary | ICD-10-CM | POA: Insufficient documentation

## 2017-06-24 DIAGNOSIS — N898 Other specified noninflammatory disorders of vagina: Secondary | ICD-10-CM | POA: Diagnosis present

## 2017-06-24 LAB — HIV ANTIBODY (ROUTINE TESTING W REFLEX): HIV Screen 4th Generation wRfx: NONREACTIVE

## 2017-06-24 MED ORDER — FLUCONAZOLE 150 MG PO TABS
150.0000 mg | ORAL_TABLET | Freq: Once | ORAL | Status: AC
  Administered 2017-06-24: 150 mg via ORAL
  Filled 2017-06-24: qty 1

## 2017-06-24 MED ORDER — CEFTRIAXONE SODIUM 250 MG IJ SOLR
250.0000 mg | Freq: Once | INTRAMUSCULAR | Status: AC
  Administered 2017-06-24: 250 mg via INTRAMUSCULAR
  Filled 2017-06-24: qty 250

## 2017-06-24 MED ORDER — AZITHROMYCIN 250 MG PO TABS
1000.0000 mg | ORAL_TABLET | Freq: Once | ORAL | Status: AC
  Administered 2017-06-24: 1000 mg via ORAL
  Filled 2017-06-24: qty 4

## 2017-06-24 NOTE — MAU Provider Note (Signed)
History     CSN: [id number]  Arrival date and time: 06/24/17 76  Non-pregnant female here with vaginal discharge and irritation. Vaginal area feels swollen, itchy, and has "cuts". Sx started about 2 days ago. Discharge is yellow and malodorous. No new partner. No new soaps or detergents. Was seen earlier today and wet prep was negative, she tried a tub soak but it burned.    Past Medical History:  Diagnosis Date  . Asthma    as a child  . History of methicillin resistant staphylococcus aureus (MRSA)   . Infection    left elbow  . Syncope   . Vaginal Pap smear, abnormal     Past Surgical History:  Procedure Laterality Date  . ELBOW SURGERY Left   . FOOT SURGERY    . GYNECOLOGIC CRYOSURGERY    . I&D EXTREMITY Left 12/03/2015   Procedure: IRRIGATION AND DEBRIDEMENT LEFT ELBOW;  Surgeon: Tarry Kos, MD;  Location: [facility] OR;  Service: Orthopedics;  Laterality: Left;  . uterine biopsy      Family History  Problem Relation Age of Onset  . Alcohol abuse Neg Hx   . Arthritis Neg Hx   . Asthma Neg Hx   . Birth defects Neg Hx   . COPD Neg Hx   . Cancer Neg Hx   . Depression Neg Hx   . Diabetes Neg Hx   . Drug abuse Neg Hx   . Early death Neg Hx   . Hearing loss Neg Hx   . Heart disease Neg Hx   . Hyperlipidemia Neg Hx   . Hypertension Neg Hx   . Kidney disease Neg Hx   . Learning disabilities Neg Hx   . Mental illness Neg Hx   . Mental retardation Neg Hx   . Miscarriages / Stillbirths Neg Hx   . Stroke Neg Hx   . Vision loss Neg Hx     Social History  Substance Use Topics  . Smoking status: Never Smoker  . Smokeless tobacco: Never Used  . Alcohol use No    Allergies: No Known Allergies  Prescriptions Prior to Admission  Medication Sig Dispense Refill Last Dose  . medroxyPROGESTERone (DEPO-PROVERA) 150 MG/ML injection Inject 1 mL (150 mg total) into the muscle every 3 (three) months. 1 mL 0 Unknown at Unknown time  . valACYclovir (VALTREX) 1000 MG tablet Take 2  tablets (2,000 mg total) by mouth 2 (two) times daily. (Patient not taking: Reported on 10/06/2016) 4 tablet 0 Not Taking    Review of Systems  Constitutional: Negative.   Genitourinary: Positive for vaginal discharge and vaginal pain.   Physical Exam   There were no vitals taken for this visit.  Physical Exam  Constitutional: She is oriented to person, place, and time. She appears well-developed and well-nourished. No distress.  HENT:  Head: Normocephalic and atraumatic.  Neck: Normal range of motion.  Respiratory: Effort normal. No respiratory distress.  Genitourinary:    There is no rash or tenderness on the right labia. There is no rash or tenderness on the left labia. There is tenderness (at introitus) in the vagina. No signs of injury around the vagina. Vaginal discharge found.  Musculoskeletal: Normal range of motion.  Neurological: She is oriented to person, place, and time.  Skin: Skin is warm and dry.  Psychiatric: She has a normal mood and affect.   MAU Course  Procedures Azithromycin Rocephin Diflucan  MDM No indication to repeat wet prep (neg) or cultures (pending).  No evidence of HSV or BV. Recommend starting probiotic with lactobacillus to balance vaginal ph. Recommend cool packs for comfort. Pt adamant that something is wronge and wants empirical tx for STIs. Will add Diflucan since itching present.    Assessment and Plan   1. Vaginal irritation    Discharge home Cool packs to perineum Start probiotic Return to MAU for OBGYN emergencies  Allergies as of 06/24/2017   No Known Allergies     Medication List    STOP taking these medications   valACYclovir 1000 MG tablet Commonly known as:  VALTREX     TAKE these medications   medroxyPROGESTERone 150 MG/ML injection Commonly known as:  DEPO-PROVERA Inject 1 mL (150 mg total) into the muscle every 3 (three) months.      Donette Larry, CNM 06/24/2017, 1:33 AM

## 2017-06-24 NOTE — MAU Note (Signed)
Pt presents to MAU for the second time today c/ vaginal swelling and a discharge. Pt states she was informed she has no infection yet she is adament that something is wrong. Pt refuses a urine sample because it is too painful. Pt states she thinks she has cuts down there.

## 2017-06-24 NOTE — Discharge Instructions (Signed)

## 2017-06-26 ENCOUNTER — Ambulatory Visit: Payer: Medicaid Other | Admitting: Obstetrics and Gynecology

## 2017-06-26 LAB — GC/CHLAMYDIA PROBE AMP (~~LOC~~) NOT AT ARMC
CHLAMYDIA, DNA PROBE: NEGATIVE
NEISSERIA GONORRHEA: NEGATIVE

## 2017-07-28 ENCOUNTER — Ambulatory Visit: Payer: Medicaid Other | Admitting: Certified Nurse Midwife

## 2017-09-26 ENCOUNTER — Encounter (HOSPITAL_COMMUNITY): Payer: Self-pay | Admitting: *Deleted

## 2017-09-26 ENCOUNTER — Inpatient Hospital Stay (HOSPITAL_COMMUNITY)
Admission: AD | Admit: 2017-09-26 | Discharge: 2017-09-26 | Disposition: A | Discharge status: Home / Self Care | Payer: Medicaid Other | Source: Ambulatory Visit | Attending: Obstetrics and Gynecology | Admitting: Obstetrics and Gynecology

## 2017-09-26 DIAGNOSIS — J45909 Unspecified asthma, uncomplicated: Secondary | ICD-10-CM | POA: Diagnosis not present

## 2017-09-26 DIAGNOSIS — B9689 Other specified bacterial agents as the cause of diseases classified elsewhere: Secondary | ICD-10-CM

## 2017-09-26 DIAGNOSIS — Z79899 Other long term (current) drug therapy: Secondary | ICD-10-CM | POA: Diagnosis not present

## 2017-09-26 DIAGNOSIS — N760 Acute vaginitis: Secondary | ICD-10-CM | POA: Diagnosis not present

## 2017-09-26 DIAGNOSIS — Z9889 Other specified postprocedural states: Secondary | ICD-10-CM | POA: Insufficient documentation

## 2017-09-26 LAB — URINALYSIS, ROUTINE W REFLEX MICROSCOPIC
Bilirubin Urine: NEGATIVE
Glucose, UA: NEGATIVE mg/dL
HGB URINE DIPSTICK: NEGATIVE
KETONES UR: NEGATIVE mg/dL
LEUKOCYTES UA: NEGATIVE
Nitrite: NEGATIVE
PROTEIN: NEGATIVE mg/dL
Specific Gravity, Urine: 1.015 (ref 1.005–1.030)
pH: 6 (ref 5.0–8.0)

## 2017-09-26 LAB — WET PREP, GENITAL
SPERM: NONE SEEN
TRICH WET PREP: NONE SEEN
Yeast Wet Prep HPF POC: NONE SEEN

## 2017-09-26 LAB — POCT PREGNANCY, URINE: PREG TEST UR: NEGATIVE

## 2017-09-26 MED ORDER — METRONIDAZOLE 500 MG PO TABS: 500.0000 mg | ORAL_TABLET | Freq: Two times a day (BID) | ORAL | 0 refills | Status: DC

## 2017-09-26 NOTE — MAU Note (Signed)
Pt reports she has a vaginal odor and discharge. Symptoms for 2-3 days. Denies abd pain.

## 2017-09-26 NOTE — Progress Notes (Addendum)
G3P3. Here due to r/o BV. States she keeps getting them "bc I dont finish my medications". Informed pt she needs to complete treatment.   VSS see flow sheet for details.   1217: provider at bs assessing. wetprep GC and labs ordered.   D/c instructions given with pt understanding. Pt left unit via ambulatory with friend

## 2017-09-26 NOTE — MAU Provider Note (Signed)
History     CSN: [id number]  Arrival date and time: 09/26/17 1149   First Provider Initiated Contact with Patient 09/26/17 1311      Chief Complaint  Patient presents with  . Vaginal Discharge   Rachael Oliver is a 26 y.o. [phone] who presents today with discharge with odor. She states that she frequently gets BV, but she does not finish the entire rx. She states that she would also like to have testing for HIV and "anything else".    Vaginal Discharge  The patient's primary symptoms include vaginal discharge. The patient's pertinent negatives include no pelvic pain. This is a new problem. Episode onset: 3 days ago. The problem occurs constantly. The problem has been unchanged. The patient is experiencing no pain. She is not pregnant. Pertinent negatives include no chills, dysuria, fever, frequency, nausea, urgency or vomiting. The vaginal discharge was malodorous. There has been no bleeding. Nothing aggravates the symptoms. She has tried nothing for the symptoms. She is sexually active. She uses condoms for contraception. Her menstrual history has been irregular (LMP uncertain, about 3 years ago. Stopped depo in May 2018, but has not resumed periods. ).     Past Medical History:  Diagnosis Date  . Asthma    as a child  . History of methicillin resistant staphylococcus aureus (MRSA)   . Infection    left elbow  . Syncope   . Vaginal Pap smear, abnormal     Past Surgical History:  Procedure Laterality Date  . ELBOW SURGERY Left   . FOOT SURGERY    . GYNECOLOGIC CRYOSURGERY    . I&D EXTREMITY Left 12/03/2015   Procedure: IRRIGATION AND DEBRIDEMENT LEFT ELBOW;  Surgeon: Tarry KosNaiping M Xu, MD;  Location: [facility] OR;  Service: Orthopedics;  Laterality: Left;  . uterine biopsy      Family History  Problem Relation Age of Onset  . Alcohol abuse Neg Hx   . Arthritis Neg Hx   . Asthma Neg Hx   . Birth defects Neg Hx   . COPD Neg Hx   . Cancer Neg Hx   . Depression Neg Hx   . Diabetes  Neg Hx   . Drug abuse Neg Hx   . Early death Neg Hx   . Hearing loss Neg Hx   . Heart disease Neg Hx   . Hyperlipidemia Neg Hx   . Hypertension Neg Hx   . Kidney disease Neg Hx   . Learning disabilities Neg Hx   . Mental illness Neg Hx   . Mental retardation Neg Hx   . Miscarriages / Stillbirths Neg Hx   . Stroke Neg Hx   . Vision loss Neg Hx     Social History   Tobacco Use  . Smoking status: Never Smoker  . Smokeless tobacco: Never Used  Substance Use Topics  . Alcohol use: No    Alcohol/week: 0.0 oz  . Drug use: No    Allergies: No Known Allergies  Medications Prior to Admission  Medication Sig Dispense Refill Last Dose  . medroxyPROGESTERone (DEPO-PROVERA) 150 MG/ML injection Inject 1 mL (150 mg total) into the muscle every 3 (three) months. 1 mL 0 Unknown at Unknown time    Review of Systems  Constitutional: Negative for chills and fever.  Gastrointestinal: Negative for nausea and vomiting.  Genitourinary: Positive for vaginal discharge. Negative for dysuria, frequency, pelvic pain and urgency.   Physical Exam   Blood pressure 113/67, pulse 69, temperature (!) 97.3 F (  36.3 C), temperature source Oral, resp. rate 17, height 5\' 5"  (1.651 m), weight 138 lb (62.6 kg), SpO2 100 %.  Physical Exam  Nursing note and vitals reviewed. Constitutional: She is oriented to person, place, and time. She appears well-developed and well-nourished. No distress.  HENT:  Head: Normocephalic.  Cardiovascular: Normal rate.  Respiratory: Effort normal.  GI: Soft. There is no tenderness. There is no rebound.  Neurological: She is alert and oriented to person, place, and time.  Skin: Skin is warm and dry.  Psychiatric: She has a normal mood and affect.   Results for orders placed or performed during the hospital encounter of 09/26/17 (from the past 24 hour(s))  Urinalysis, Routine w reflex microscopic     Status: Abnormal   Collection Time: 09/26/17 12:00 PM  Result Value Ref  Range   Color, Urine YELLOW YELLOW   APPearance HAZY (A) CLEAR   Specific Gravity, Urine 1.015 1.005 - 1.030   pH 6.0 5.0 - 8.0   Glucose, UA NEGATIVE NEGATIVE mg/dL   Hgb urine dipstick NEGATIVE NEGATIVE   Bilirubin Urine NEGATIVE NEGATIVE   Ketones, ur NEGATIVE NEGATIVE mg/dL   Protein, ur NEGATIVE NEGATIVE mg/dL   Nitrite NEGATIVE NEGATIVE   Leukocytes, UA NEGATIVE NEGATIVE  Pregnancy, urine POC     Status: None   Collection Time: 09/26/17 12:26 PM  Result Value Ref Range   Preg Test, Ur NEGATIVE NEGATIVE  Wet prep, genital     Status: Abnormal   Collection Time: 09/26/17  1:13 PM  Result Value Ref Range   Yeast Wet Prep HPF POC NONE SEEN NONE SEEN   Trich, Wet Prep NONE SEEN NONE SEEN   Clue Cells Wet Prep HPF POC PRESENT (A) NONE SEEN   WBC, Wet Prep HPF POC FEW (A) NONE SEEN   Sperm NONE SEEN     MAU Course  Procedures  MDM   Assessment and Plan   1. Bacterial vaginosis    DC home HIV/RPR pending  Comfort measures reviewed  RX: flagyl 500mg  BID x 7 days  Return to MAU as needed   Follow-up Information    [facility].   Contact information: [address] [place] [address] [phone]            Thressa ShellerHeather Hogan 09/26/2017, 1:15 PM

## 2017-09-26 NOTE — Discharge Instructions (Signed)
In late 2019, the [facility] will be moving to the [facility]. At that time, the MAU (Maternity Admissions Unit), where you are being seen today, will no longer see non-pregnant patients. We strongly encourage you to find a doctor's office before that time, so that you can be seen with any GYN concerns, like vaginal discharge, urinary tract infection, etc.. in a timely manner.   In order to make the office visit more convenient, the Center [facility] at [facility] will be offering evening hours from 4pm-7:30pm on Monday. There will be same-day appointments, walk-in appointments and scheduled appointments available during this time. We will be adding more evening hours over the next year before the move.   [facility] [phone]- [phone]  For urgent needs, [facility] is also available for management of urgent GYN complaints such as vaginal discharge or urinary tract infections.   Primary care follow up  [facility] (will see you even if you do not have sickle cell): [phone] [facility]: [phone] [facility]: [phone]    Bacterial Vaginosis Bacterial vaginosis is an infection of the vagina. It happens when too many germs (bacteria) grow in the vagina. This infection puts you at risk for infections from sex (STIs). Treating this infection can lower your risk for some STIs. You should also treat this if you are pregnant. It can cause your baby to be born early. Follow these instructions at home: Medicines  Take over-the-counter and prescription medicines only as told by your doctor.  Take or use your antibiotic medicine as told by your doctor. Do not stop taking or using it even if you start to feel better. General instructions  If you your sexual partner is a woman, tell her that you have this infection. She needs to get treatment if she has symptoms. If you have a  female partner, he does not need to be treated.  During treatment: ? Avoid sex. ? Do not douche. ? Avoid alcohol as told. ? Avoid breastfeeding as told.  Drink enough fluid to keep your pee (urine) clear or pale yellow.  Keep your vagina and butt (rectum) clean. ? Wash the area with warm water every day. ? Wipe from front to back after you use the toilet.  Keep all follow-up visits as told by your doctor. This is important. Preventing this condition  Do not douche.  Use only warm water to wash around your vagina.  Use protection when you have sex. This includes: ? Latex condoms. ? Dental dams.  Limit how many people you have sex with. It is best to only have sex with the same person (be monogamous).  Get tested for STIs. Have your partner get tested.  Wear underwear that is cotton or lined with cotton.  Avoid tight pants and pantyhose. This is most important in summer.  Do not use any products that have nicotine or tobacco in them. These include cigarettes and e-cigarettes. If you need help quitting, ask your doctor.  Do not use illegal drugs.  Limit how much alcohol you drink. Contact a doctor if:  Your symptoms do not get better, even after you are treated.  You have more discharge or pain when you pee (urinate).  You have a fever.  You have pain in your belly (abdomen).  You have pain with sex.  Your bleed from your vagina between periods. Summary  This infection happens when too many  germs (bacteria) grow in the vagina.  Treating this condition can lower your risk for some infections from sex (STIs).  You should also treat this if you are pregnant. It can cause early (premature) birth.  Do not stop taking or using your antibiotic medicine even if you start to feel better. This information is not intended to replace advice given to you by your health care provider. Make sure you discuss any questions you have with your health care provider. Document  Released: 08/02/2008 Document Revised: 07/09/2016 Document Reviewed: 07/09/2016 Elsevier Interactive Patient Education  2017 [employer]

## 2017-09-27 LAB — GC/CHLAMYDIA PROBE AMP (~~LOC~~) NOT AT ARMC
Chlamydia: NEGATIVE
Neisseria Gonorrhea: NEGATIVE

## 2017-12-10 ENCOUNTER — Emergency Department (HOSPITAL_COMMUNITY): Payer: Medicaid Other

## 2017-12-10 ENCOUNTER — Emergency Department (HOSPITAL_COMMUNITY)
Admission: EM | Admit: 2017-12-10 | Discharge: 2017-12-10 | Disposition: A | Discharge status: Home / Self Care | Payer: Medicaid Other | Attending: Emergency Medicine | Admitting: Emergency Medicine

## 2017-12-10 DIAGNOSIS — J111 Influenza due to unidentified influenza virus with other respiratory manifestations: Secondary | ICD-10-CM

## 2017-12-10 DIAGNOSIS — E162 Hypoglycemia, unspecified: Secondary | ICD-10-CM | POA: Diagnosis not present

## 2017-12-10 DIAGNOSIS — R079 Chest pain, unspecified: Secondary | ICD-10-CM | POA: Diagnosis present

## 2017-12-10 DIAGNOSIS — E860 Dehydration: Secondary | ICD-10-CM | POA: Diagnosis not present

## 2017-12-10 DIAGNOSIS — J45909 Unspecified asthma, uncomplicated: Secondary | ICD-10-CM | POA: Insufficient documentation

## 2017-12-10 DIAGNOSIS — R69 Illness, unspecified: Secondary | ICD-10-CM

## 2017-12-10 LAB — CBC WITH DIFFERENTIAL/PLATELET
BASOS ABS: 0 10*3/uL (ref 0.0–0.1)
BASOS PCT: 0 %
EOS PCT: 0 %
Eosinophils Absolute: 0 10*3/uL (ref 0.0–0.7)
HCT: 43.2 % (ref 36.0–46.0)
Hemoglobin: 14.3 g/dL (ref 12.0–15.0)
LYMPHS PCT: 8 %
Lymphs Abs: 0.7 10*3/uL (ref 0.7–4.0)
MCH: 27.7 pg (ref 26.0–34.0)
MCHC: 33.1 g/dL (ref 30.0–36.0)
MCV: 83.6 fL (ref 78.0–100.0)
MONO ABS: 0.4 10*3/uL (ref 0.1–1.0)
Monocytes Relative: 5 %
Neutro Abs: 7.7 10*3/uL (ref 1.7–7.7)
Neutrophils Relative %: 87 %
PLATELETS: 221 10*3/uL (ref 150–400)
RBC: 5.17 MIL/uL — ABNORMAL HIGH (ref 3.87–5.11)
RDW: 12.9 % (ref 11.5–15.5)
WBC: 8.7 10*3/uL (ref 4.0–10.5)

## 2017-12-10 LAB — URINALYSIS, ROUTINE W REFLEX MICROSCOPIC
BILIRUBIN URINE: NEGATIVE
GLUCOSE, UA: 150 mg/dL — AB
HGB URINE DIPSTICK: NEGATIVE
KETONES UR: 80 mg/dL — AB
Leukocytes, UA: NEGATIVE
Nitrite: NEGATIVE
PH: 5 (ref 5.0–8.0)
Protein, ur: NEGATIVE mg/dL
SPECIFIC GRAVITY, URINE: 1.019 (ref 1.005–1.030)

## 2017-12-10 LAB — I-STAT BETA HCG BLOOD, ED (MC, WL, AP ONLY): I-stat hCG, quantitative: <5 m[IU]/mL (ref <5)

## 2017-12-10 LAB — COMPREHENSIVE METABOLIC PANEL
ALBUMIN: 4.8 g/dL (ref 3.5–5.0)
ALT: 32 U/L (ref 14–54)
AST: 56 U/L — AB (ref 15–41)
Alkaline Phosphatase: 92 U/L (ref 38–126)
Anion gap: 16 — ABNORMAL HIGH (ref 5–15)
BUN: 12 mg/dL (ref 6–20)
CHLORIDE: 100 mmol/L — AB (ref 101–111)
CO2: 21 mmol/L — AB (ref 22–32)
CREATININE: 1.04 mg/dL — AB (ref 0.44–1.00)
Calcium: 9.8 mg/dL (ref 8.9–10.3)
GFR calc Af Amer: >60 mL/min (ref >60)
GLUCOSE: 73 mg/dL (ref 65–99)
POTASSIUM: 3.9 mmol/L (ref 3.5–5.1)
SODIUM: 137 mmol/L (ref 135–145)
Total Bilirubin: 0.9 mg/dL (ref 0.3–1.2)
Total Protein: 9.3 g/dL — ABNORMAL HIGH (ref 6.5–8.1)

## 2017-12-10 LAB — CBG MONITORING, ED
GLUCOSE-CAPILLARY: 44 mg/dL — AB (ref 65–99)
GLUCOSE-CAPILLARY: 66 mg/dL (ref 65–99)
Glucose-Capillary: 145 mg/dL — ABNORMAL HIGH (ref 65–99)

## 2017-12-10 LAB — I-STAT TROPONIN, ED: Troponin i, poc: 0.01 ng/mL (ref 0.00–0.08)

## 2017-12-10 LAB — RAPID STREP SCREEN (MED CTR MEBANE ONLY): Streptococcus, Group A Screen (Direct): NEGATIVE

## 2017-12-10 LAB — INFLUENZA PANEL BY PCR (TYPE A & B)
INFLAPCR: NEGATIVE
INFLBPCR: NEGATIVE

## 2017-12-10 MED ORDER — NAPROXEN 375 MG PO TABS: 375.0000 mg | ORAL_TABLET | Freq: Two times a day (BID) | ORAL | 0 refills | Status: AC | PRN

## 2017-12-10 MED ORDER — ONDANSETRON 4 MG PO TBDP: 4.0000 mg | ORAL_TABLET | Freq: Three times a day (TID) | ORAL | 0 refills | Status: DC | PRN

## 2017-12-10 MED ORDER — ACETAMINOPHEN 500 MG PO TABS
1000.0000 mg | ORAL_TABLET | Freq: Once | ORAL | Status: AC
  Administered 2017-12-10: 1000 mg via ORAL
  Filled 2017-12-10: qty 2

## 2017-12-10 MED ORDER — SODIUM CHLORIDE 0.9 % IV BOLUS (SEPSIS)
500.0000 mL | Freq: Once | INTRAVENOUS | Status: AC
Start: 2017-12-10 — End: 2017-12-10
  Administered 2017-12-10: 500 mL via INTRAVENOUS

## 2017-12-10 MED ORDER — DICYCLOMINE HCL 20 MG PO TABS: 20.0000 mg | ORAL_TABLET | Freq: Three times a day (TID) | ORAL | 0 refills | Status: DC | PRN

## 2017-12-10 MED ORDER — DEXTROSE 50 % IV SOLN
25.0000 mL | Freq: Once | INTRAVENOUS | Status: AC
  Administered 2017-12-10: 25 mL via INTRAVENOUS

## 2017-12-10 MED ORDER — DEXTROSE 50 % IV SOLN
INTRAVENOUS | Status: AC
  Filled 2017-12-10: qty 50

## 2017-12-10 MED ORDER — DEXAMETHASONE SODIUM PHOSPHATE 10 MG/ML IJ SOLN
10.0000 mg | Freq: Once | INTRAMUSCULAR | Status: AC
  Administered 2017-12-10: 10 mg via INTRAVENOUS
  Filled 2017-12-10: qty 1

## 2017-12-10 MED ORDER — ONDANSETRON HCL 4 MG/2ML IJ SOLN
4.0000 mg | Freq: Once | INTRAMUSCULAR | Status: AC
  Administered 2017-12-10: 4 mg via INTRAVENOUS
  Filled 2017-12-10: qty 2

## 2017-12-10 MED ORDER — SODIUM CHLORIDE 0.9 % IV SOLN
Freq: Once | INTRAVENOUS | Status: AC
  Administered 2017-12-10: 14:00:00 via INTRAVENOUS

## 2017-12-10 MED ORDER — LACTATED RINGERS IV BOLUS (SEPSIS)
1000.0000 mL | Freq: Once | INTRAVENOUS | Status: AC
  Administered 2017-12-10: 1000 mL via INTRAVENOUS

## 2017-12-10 MED ORDER — KETOROLAC TROMETHAMINE 15 MG/ML IJ SOLN
15.0000 mg | Freq: Once | INTRAMUSCULAR | Status: AC
  Administered 2017-12-10: 15 mg via INTRAVENOUS
  Filled 2017-12-10: qty 1

## 2017-12-10 NOTE — ED Triage Notes (Signed)
Pt to ED via [facility] with c/o "don't feel good" Pt's CBG for EMS was 64-- received oral glucose -- cbg on arrival to ed was 44-- Orange juice given-- pt c/o sore throat and headache since last night-- chest pain-- with resp - cough,
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
## 2017-12-10 NOTE — ED Notes (Signed)
PT aware her cbg is low, refusing to drink juice or soda, states " I dont like it." states she also has not eaten in two days.

## 2017-12-13 LAB — CULTURE, GROUP A STREP (THRC)

## 2017-12-20 ENCOUNTER — Other Ambulatory Visit (HOSPITAL_COMMUNITY)
Admission: RE | Admit: 2017-12-20 | Discharge: 2017-12-20 | Disposition: A | Discharge status: Home / Self Care | Payer: Medicaid Other | Source: Ambulatory Visit | Attending: Certified Nurse Midwife | Admitting: Certified Nurse Midwife

## 2017-12-20 ENCOUNTER — Other Ambulatory Visit: Payer: Self-pay

## 2017-12-20 ENCOUNTER — Encounter: Payer: Self-pay | Admitting: Certified Nurse Midwife

## 2017-12-20 ENCOUNTER — Ambulatory Visit: Payer: Medicaid Other | Admitting: Certified Nurse Midwife

## 2017-12-20 VITALS — BP 109/64 | HR 69 | Ht 64.0 in | Wt 137.2 lb

## 2017-12-20 DIAGNOSIS — Z0000 Encounter for general adult medical examination without abnormal findings: Secondary | ICD-10-CM | POA: Diagnosis not present

## 2017-12-20 DIAGNOSIS — R87610 Atypical squamous cells of undetermined significance on cytologic smear of cervix (ASC-US): Secondary | ICD-10-CM | POA: Insufficient documentation

## 2017-12-20 DIAGNOSIS — Z01419 Encounter for gynecological examination (general) (routine) without abnormal findings: Secondary | ICD-10-CM | POA: Diagnosis not present

## 2017-12-20 DIAGNOSIS — Z3202 Encounter for pregnancy test, result negative: Secondary | ICD-10-CM

## 2017-12-20 DIAGNOSIS — Z113 Encounter for screening for infections with a predominantly sexual mode of transmission: Secondary | ICD-10-CM

## 2017-12-20 LAB — POCT URINE PREGNANCY: Preg Test, Ur: NEGATIVE
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
## 2017-12-20 NOTE — Progress Notes (Signed)
Presents for AEX/PAP 

## 2017-12-21 LAB — CERVICOVAGINAL ANCILLARY ONLY
Chlamydia: NEGATIVE
NEISSERIA GONORRHEA: NEGATIVE

## 2017-12-26 LAB — CYTOLOGY - PAP
Diagnosis: UNDETERMINED — AB
HPV 16/18/45 GENOTYPING: POSITIVE — AB
HPV: DETECTED — AB

## 2017-12-27 ENCOUNTER — Other Ambulatory Visit: Payer: Self-pay | Admitting: Certified Nurse Midwife

## 2018-01-02 ENCOUNTER — Telehealth: Payer: Self-pay

## 2018-01-02 ENCOUNTER — Ambulatory Visit: Payer: Medicaid Other | Admitting: Podiatry

## 2018-01-02 ENCOUNTER — Ambulatory Visit (INDEPENDENT_AMBULATORY_CARE_PROVIDER_SITE_OTHER): Payer: Medicaid Other

## 2018-01-02 ENCOUNTER — Other Ambulatory Visit: Payer: Self-pay | Admitting: Certified Nurse Midwife

## 2018-01-02 DIAGNOSIS — M216X1 Other acquired deformities of right foot: Secondary | ICD-10-CM

## 2018-01-02 DIAGNOSIS — R87610 Atypical squamous cells of undetermined significance on cytologic smear of cervix (ASC-US): Secondary | ICD-10-CM | POA: Insufficient documentation

## 2018-01-02 DIAGNOSIS — M21619 Bunion of unspecified foot: Secondary | ICD-10-CM

## 2018-01-02 DIAGNOSIS — Q828 Other specified congenital malformations of skin: Secondary | ICD-10-CM

## 2018-01-02 DIAGNOSIS — R87810 Cervical high risk human papillomavirus (HPV) DNA test positive: Principal | ICD-10-CM

## 2018-01-02 NOTE — Telephone Encounter (Signed)
Left VM message to call office.

## 2018-01-02 NOTE — Patient Instructions (Signed)

## 2018-01-02 NOTE — Progress Notes (Signed)
Subjective:    Patient ID: Rachael Oliver, female    DOB: 06/27/1991, 27 y.o.   MRN: [id number]  HPI Rachael Oliver presents the office today for concerns of pain to a callus.  Since she points to the ball of her feet as well as for pain to the side of her foot she points on the bunion to the right foot and mildly to the left foot.  This is been ongoing for the last several years and at times her pain level 7/10 describes an aching sensation.  She has a history of to the splenectomy several years ago.  She said no treatment for any new issues.  No recent injury or trauma she denies any swelling or redness.  She has no other concerns today.   Review of Systems  All other systems reviewed and are negative.  Past Medical History:  Diagnosis Date  . Asthma    as a child  . History of methicillin resistant staphylococcus aureus (MRSA)   . Infection    left elbow  . Syncope   . Vaginal Pap smear, abnormal     Past Surgical History:  Procedure Laterality Date  . ELBOW SURGERY Left   . FOOT SURGERY    . GYNECOLOGIC CRYOSURGERY    . I&D EXTREMITY Left 12/03/2015   Procedure: IRRIGATION AND DEBRIDEMENT LEFT ELBOW;  Surgeon: Tarry KosNaiping M Xu, MD;  Location: [facility] OR;  Service: Orthopedics;  Laterality: Left;  . uterine biopsy      No current outpatient medications on file.  No Known Allergies  Social History   Socioeconomic History  . Marital status: Single    Spouse name: Not on file  . Number of children: Not on file  . Years of education: Not on file  . Highest education level: Not on file  Social Needs  . Financial resource strain: Not on file  . Food insecurity - worry: Not on file  . Food insecurity - inability: Not on file  . Transportation needs - medical: Not on file  . Transportation needs - non-medical: Not on file  Occupational History  . Not on file  Tobacco Use  . Smoking status: Never Smoker  . Smokeless tobacco: Never Used  Substance and Sexual Activity  . Alcohol use:  No    Alcohol/week: 0.0 oz  . Drug use: No  . Sexual activity: Yes    Partners: Male    Birth control/protection: None, Condom  Other Topics Concern  . Not on file  Social History Narrative  . Not on file        Objective:   Physical Exam  General: AAO x3, NAD  Dermatological: Hyperkeratotic lesions bilateral submetatarsal 3.  There is no ascending erythema, ascending sialitis.  There is no clinical signs of infection noted.  No other open lesions or pre-ulcerative lesions.  Scar from the prior surgery is well-healed.  Vascular: Dorsalis Pedis artery and Posterior Tibial artery pedal pulses are 2/4 bilateral with immedate capillary fill time. Pedal hair growth present.  There is no pain with calf compression, swelling, warmth, erythema.   Neruologic: Grossly intact via light touch bilateral. Protective threshold with Semmes Wienstein monofilament intact to all pedal sites bilateral.  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  There is mild tenderness palpation to the bunion itself but there is no pain or crepitation with MPJ range of motion.  There is no pain to the other areas of the foot other than  the hyperkeratotic lesions.  Decrease in medial arch height.  There is no overlying edema, erythema, increase in warmth muscular strength 5/5 in all groups tested bilateral.  Gait: Unassisted, Nonantalgic.      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  I reviewed the x-rays with her.  Mild HAV is present.  No evidence of acute fracture identified. -We discussed both conservative as well as surgical treatment options.  We discussed conservative treatments including shoe modifications, offloading padding as  well as orthotics.  If the area becomes more painful steroid injection but currently is no swelling and minimal discomfort. -Surgically we discussed also bunionectomy with a painful bunion.  At this time she elects to proceed with conservative treatment. -Follow-up as needed.  Call any questions or concerns.  Vivi Barrack DPM

## 2018-01-02 NOTE — Telephone Encounter (Signed)
-----   Message from Roe Coombsachelle A Denney, CNM sent at 01/02/2018  8:44 AM EST ----- Please schedule her for a colposcopy. I have sent a mychart note indicating the following: Please let her know that she had some changes on her pap smear and that we will need to take a closer look. Needs Gardasil series.  Thank you.  R.Denney CNM

## 2018-01-04 ENCOUNTER — Telehealth: Payer: Self-pay

## 2018-01-04 ENCOUNTER — Ambulatory Visit: Payer: Medicaid Other | Admitting: Certified Nurse Midwife

## 2018-01-04 NOTE — Telephone Encounter (Signed)
-----   Message from Roe Coombsachelle A Denney, CNM sent at 01/02/2018  8:44 AM EST ----- Please schedule her for a colposcopy. I have sent a mychart note indicating the following: Please let her know that she had some changes on her pap smear and that we will need to take a closer look. Needs Gardasil series.  Thank you.  R.Denney CNM

## 2018-01-04 NOTE — Telephone Encounter (Signed)
Left VM message to check her MyChart for message from Doctor and call office.

## 2018-01-05 NOTE — Progress Notes (Signed)
Colpo appt made 01/09/18 @ 10:00 am

## 2018-01-05 NOTE — Progress Notes (Signed)
Left VM message to call office. Check MyChart. Call to schedule appt.

## 2018-01-09 ENCOUNTER — Other Ambulatory Visit (HOSPITAL_COMMUNITY)
Admission: RE | Admit: 2018-01-09 | Discharge: 2018-01-09 | Disposition: A | Discharge status: Home / Self Care | Payer: Medicaid Other | Source: Ambulatory Visit | Attending: Certified Nurse Midwife | Admitting: Certified Nurse Midwife

## 2018-01-09 ENCOUNTER — Ambulatory Visit: Payer: Medicaid Other | Admitting: Podiatry

## 2018-01-09 ENCOUNTER — Other Ambulatory Visit (HOSPITAL_COMMUNITY): Admission: RE | Admit: 2018-01-09 | Payer: Medicaid Other | Source: Ambulatory Visit

## 2018-01-09 ENCOUNTER — Ambulatory Visit: Payer: Medicaid Other | Admitting: Obstetrics

## 2018-01-09 ENCOUNTER — Encounter: Payer: Self-pay | Admitting: Certified Nurse Midwife

## 2018-01-09 VITALS — BP 118/78 | HR 67 | Wt 139.0 lb

## 2018-01-09 DIAGNOSIS — B9689 Other specified bacterial agents as the cause of diseases classified elsewhere: Secondary | ICD-10-CM | POA: Diagnosis not present

## 2018-01-09 DIAGNOSIS — N760 Acute vaginitis: Secondary | ICD-10-CM | POA: Diagnosis not present

## 2018-01-09 DIAGNOSIS — Z3202 Encounter for pregnancy test, result negative: Secondary | ICD-10-CM | POA: Diagnosis not present

## 2018-01-09 DIAGNOSIS — Z23 Encounter for immunization: Secondary | ICD-10-CM

## 2018-01-09 DIAGNOSIS — R87810 Cervical high risk human papillomavirus (HPV) DNA test positive: Secondary | ICD-10-CM | POA: Insufficient documentation

## 2018-01-09 DIAGNOSIS — N898 Other specified noninflammatory disorders of vagina: Secondary | ICD-10-CM | POA: Insufficient documentation

## 2018-01-09 DIAGNOSIS — R87610 Atypical squamous cells of undetermined significance on cytologic smear of cervix (ASC-US): Secondary | ICD-10-CM

## 2018-01-09 DIAGNOSIS — B373 Candidiasis of vulva and vagina: Secondary | ICD-10-CM | POA: Diagnosis not present

## 2018-01-09 DIAGNOSIS — Z9889 Other specified postprocedural states: Secondary | ICD-10-CM | POA: Insufficient documentation

## 2018-01-09 LAB — POCT URINE PREGNANCY: PREG TEST UR: NEGATIVE

## 2018-01-09 NOTE — Progress Notes (Signed)
Colposcopy Procedure Note  Indications: Pap smear 1 months ago showed: ASCUS with POSITIVE high risk HPV. The prior pap showed no abnormalities.  Prior cervical/vaginal disease: normal exam without visible pathology. Prior cervical treatment: cryosurgery for HGSIL in 2016; and normal pap smear in 2017.   Procedure Details  The risks and benefits of the procedure and Written informed consent obtained.  A time-out was performed confirming the patient, procedure and allergy status  Speculum placed in vagina and excellent visualization of cervix achieved, cervix swabbed x 3 with acetic acid solution.  Findings: Cervix: acetowhite lesion(s) noted at 8 o'clock and punctation noted at 5 o'clock; SCJ visualized 360 degrees without lesions, endocervical curettage performed, cervical biopsies taken at 5, 8 and 3 o'clock, specimen labelled and sent to pathology and hemostasis achieved with silver nitrate.   Vaginal inspection: normal without visible lesions. Vulvar colposcopy: vulvar colposcopy not performed.  Specimens: ECC and cervical biopsies x 3  Complications: none.  Plan: Specimens labelled and sent to Pathology. Will base further treatment on Pathology findings. Post biopsy instructions given to patient. Return to discuss Pathology results in 2 weeks    Brock BadHARLES A. Annya Lizana MD.

## 2018-01-09 NOTE — Progress Notes (Signed)
RGYN presents for colpo   Pt admits to unprtected intercourse last night. Ok per provider to proceed.  Wants to discuss Vaccine first.

## 2018-01-10 LAB — CERVICOVAGINAL ANCILLARY ONLY
Bacterial vaginitis: POSITIVE — AB
Candida vaginitis: POSITIVE — AB
Chlamydia: NEGATIVE
Neisseria Gonorrhea: NEGATIVE
Trichomonas: POSITIVE — AB

## 2018-01-11 ENCOUNTER — Other Ambulatory Visit: Payer: Self-pay | Admitting: Obstetrics

## 2018-01-11 DIAGNOSIS — B9689 Other specified bacterial agents as the cause of diseases classified elsewhere: Secondary | ICD-10-CM

## 2018-01-11 DIAGNOSIS — A5901 Trichomonal vulvovaginitis: Secondary | ICD-10-CM

## 2018-01-11 DIAGNOSIS — N760 Acute vaginitis: Secondary | ICD-10-CM

## 2018-01-11 DIAGNOSIS — B373 Candidiasis of vulva and vagina: Secondary | ICD-10-CM

## 2018-01-11 DIAGNOSIS — B3731 Acute candidiasis of vulva and vagina: Secondary | ICD-10-CM

## 2018-01-11 MED ORDER — FLUCONAZOLE 150 MG PO TABS: 150.0000 mg | ORAL_TABLET | Freq: Once | ORAL | 0 refills | Status: AC

## 2018-01-11 MED ORDER — TINIDAZOLE 500 MG PO TABS: 2.0000 g | ORAL_TABLET | Freq: Every day | ORAL | 0 refills | Status: DC

## 2018-01-16 ENCOUNTER — Ambulatory Visit: Payer: Medicaid Other | Admitting: Podiatry

## 2018-01-16 ENCOUNTER — Encounter: Payer: Self-pay | Admitting: Podiatry

## 2018-01-16 DIAGNOSIS — M21619 Bunion of unspecified foot: Secondary | ICD-10-CM

## 2018-01-16 DIAGNOSIS — M21611 Bunion of right foot: Secondary | ICD-10-CM

## 2018-01-16 NOTE — Patient Instructions (Signed)

## 2018-01-21 NOTE — Progress Notes (Signed)
Subjective: Rachael Oliver presents the office today for surgical consultation.  She states that she did try inserts although for only short amount of time she states that it did not make any difference.  The bunion is been ongoing for some time.  She also has tried some offloading pads and she did continues to get pain pointing the bunion on the right side.  At this time she wishes to proceed with surgical intervention.  Risk of calluses to both of her feet, pointing to submetatarsal 3.  He did get painful to get thick.  No redness or drainage or any swelling.  She has no other concerns today. Denies any systemic complaints such as fevers, chills, nausea, vomiting. No acute changes since last appointment, and no other complaints at this time.   Objective: AAO x3, NAD DP/PT pulses palpable bilaterally, CRT less than 3 seconds Mild HAV on the right side but there is tenderness palpation on the bunion site.  There is no pain or crepitation with MPJ range of motion.  There is no first ray hypomobility present.  There is slight erythema on the medial first metatarsal from irritation set issues.  There is no open sores or swelling or any signs of infection present.  There is a decrease in medial arch height upon weightbearing.  Small punctate annular hyperkeratotic lesions bilateral submetatarsal 3.  No underlying ulceration, drainage or any signs of infection. No open lesions or pre-ulcerative lesions.  No pain with calf compression, swelling, warmth, erythema  Assessment: Right symptomatic HAV, flatfoot deformity with a porokeratosis  Plan: -All treatment options discussed with the patient including all alternatives, risks, complications.  -We discussed both conservative as well as surgical treatment options for the bunion.  This time she is requesting surgical intervention to help decrease her pain and deformity.  We discussed surgery as well as the postoperative course.  We discussed an Austin bunionectomy  with screw fixation on the right side.  We discussed that this is not a guarantee of resolution of symptoms.  Long-term there is a chance of recurrence and she is going likely need to have a custom orthotic and she will see with a custom orthotic after surgery. -The incision placement as well as the postoperative course was discussed with the patient. I discussed risks of the surgery which include, but not limited to, infection, bleeding, pain, swelling, need for further surgery, delayed or nonhealing, painful or ugly scar, numbness or sensation changes, over/under correction, recurrence, transfer lesions, further deformity, hardware failure, DVT/PE, loss of toe/foot. Patient understands these risks and wishes to proceed with surgery. The surgical consent was reviewed with the patient all 3 pages were signed. No promises or guarantees were given to the outcome of the procedure. All questions were answered to the best of my ability. Before the surgery the patient was encouraged to call the office if there is any further questions. [facility]. -I did sharply debride the hyperkeratotic lesions x2 without any complications or bleeding.  Rachael Oliver DPM   -Patient encouraged to call the office with any questions, concerns, change in symptoms.

## 2018-01-24 ENCOUNTER — Ambulatory Visit (INDEPENDENT_AMBULATORY_CARE_PROVIDER_SITE_OTHER): Payer: Medicaid Other | Admitting: Obstetrics

## 2018-01-24 ENCOUNTER — Encounter: Payer: Self-pay | Admitting: Obstetrics

## 2018-01-24 ENCOUNTER — Ambulatory Visit: Payer: Medicaid Other | Admitting: Obstetrics

## 2018-01-24 VITALS — BP 108/62 | HR 75 | Ht 64.0 in | Wt 141.0 lb

## 2018-01-24 DIAGNOSIS — R87810 Cervical high risk human papillomavirus (HPV) DNA test positive: Secondary | ICD-10-CM

## 2018-01-24 DIAGNOSIS — R87610 Atypical squamous cells of undetermined significance on cytologic smear of cervix (ASC-US): Secondary | ICD-10-CM

## 2018-01-24 NOTE — Progress Notes (Signed)
Patient is in the office for follow up after colpo on 01-09-18.

## 2018-01-24 NOTE — Progress Notes (Signed)
Patient ID: Rachael Oliver, female   DOB: 06/12/1991, 27 y.o.   MRN: [id number]  Chief Complaint  Patient presents with  . Follow-up    HPI Rachael Oliver is a 27 y.o. female.  History of ASCUS with Positive High Risk HPV.  History of BV, Trichomonas and Candida, treated. S/P Colposcopy.  Presents today for results. HPI  Past Medical History:  Diagnosis Date  . Asthma    as a child  . History of methicillin resistant staphylococcus aureus (MRSA)   . Infection    left elbow  . Syncope   . Vaginal Pap smear, abnormal     Past Surgical History:  Procedure Laterality Date  . ELBOW SURGERY Left   . FOOT SURGERY    . GYNECOLOGIC CRYOSURGERY    . I&D EXTREMITY Left 12/03/2015   Procedure: IRRIGATION AND DEBRIDEMENT LEFT ELBOW;  Surgeon: Rachael Kos, MD;  Location: [facility] OR;  Service: Orthopedics;  Laterality: Left;  . uterine biopsy      Family History  Problem Relation Age of Onset  . Alcohol abuse Neg Hx   . Arthritis Neg Hx   . Asthma Neg Hx   . Birth defects Neg Hx   . COPD Neg Hx   . Cancer Neg Hx   . Depression Neg Hx   . Diabetes Neg Hx   . Drug abuse Neg Hx   . Early death Neg Hx   . Hearing loss Neg Hx   . Heart disease Neg Hx   . Hyperlipidemia Neg Hx   . Hypertension Neg Hx   . Kidney disease Neg Hx   . Learning disabilities Neg Hx   . Mental illness Neg Hx   . Mental retardation Neg Hx   . Miscarriages / Stillbirths Neg Hx   . Stroke Neg Hx   . Vision loss Neg Hx     Social History Social History   Tobacco Use  . Smoking status: Never Smoker  . Smokeless tobacco: Never Used  Substance Use Topics  . Alcohol use: No    Alcohol/week: 0.0 oz  . Drug use: No    No Known Allergies  Current Outpatient Medications  Medication Sig Dispense Refill  . tinidazole (TINDAMAX) 500 MG tablet Take 4 tablets (2,000 mg total) by mouth daily with breakfast. (Patient not taking: Reported on 01/24/2018) 8 tablet 0   No current facility-administered medications for  this visit.     Review of Systems Review of Systems Constitutional: negative for fatigue and weight loss Respiratory: negative for cough and wheezing Cardiovascular: negative for chest pain, fatigue and palpitations Gastrointestinal: negative for abdominal pain and change in bowel habits Genitourinary:negative Integument/breast: negative for nipple discharge Musculoskeletal:negative for myalgias Neurological: negative for gait problems and tremors Behavioral/Psych: negative for abusive relationship, depression Endocrine: negative for temperature intolerance      Blood pressure 108/62, pulse 75, height 5\' 4"  (1.626 m), weight 141 lb (64 kg), last menstrual period 01/19/2018.  Physical Exam Physical Exam:  Deferred  >50% of 10 min visit spent on counseling and coordination of care.   Data Reviewed  Pap Smear:                                                                                                                                                                                            - - -    --  ASCUS with Positive High Risk HPV   Colposcopy with Biopsies and ECC - No Dysplasia or Atypia.  Negative ECC.  Positive for Inflammatory and HPV Changes.   Assessment and Plan:  1. ASCUS with positive high risk HPV cervical.  S/P Colposcopic biopsies and ECC. - No Dysplasia - Repeat Pap in 1 Year   Brock BadHARLES A. Soledad Budreau MD 01-24-2018

## 2018-01-31 ENCOUNTER — Encounter: Payer: Self-pay | Admitting: Podiatry

## 2018-01-31 DIAGNOSIS — M2011 Hallux valgus (acquired), right foot: Secondary | ICD-10-CM | POA: Diagnosis not present

## 2018-02-01 ENCOUNTER — Telehealth: Payer: Self-pay | Admitting: Podiatry

## 2018-02-01 NOTE — Telephone Encounter (Signed)
Left message for pt to call again and I would help with her questions.

## 2018-02-01 NOTE — Telephone Encounter (Signed)
Pt states she is in so much pain, but the leg from the knee to the ankle is still numb. I told pt that was not unusual for that numbness to last 72 hours. I told pt to sit down and put the surgical foot level with her hip, remove the boot, open-ended sock, and the ace wrap, then elevate the foot for 15 minute, but if the pain worsened then dangle for 15 minutes, after 15 minutes put foot level with the hip and rewrap looser the ace beginning at the toes and wrapping up the leg, then reapply the sock and boot. I told pt that if she tolerated ibuprofen OTC she could take it as the package instructs in between the dosing of the pain medication. I told pt not to be up on the surgery foot or dangling the foot more than 15 minutes/hour and to call with concerns.

## 2018-02-01 NOTE — Telephone Encounter (Signed)
Hi, this is Rachael Oliver. Can you please give me a call back at [phone]. I have some questions about my surgery. Thank you.

## 2018-02-05 ENCOUNTER — Encounter: Payer: Self-pay | Admitting: Podiatry

## 2018-02-05 ENCOUNTER — Ambulatory Visit (INDEPENDENT_AMBULATORY_CARE_PROVIDER_SITE_OTHER): Payer: Medicaid Other

## 2018-02-05 ENCOUNTER — Ambulatory Visit (INDEPENDENT_AMBULATORY_CARE_PROVIDER_SITE_OTHER): Payer: Self-pay | Admitting: Podiatry

## 2018-02-05 VITALS — BP 109/73 | HR 75 | Temp 97.7°F | Resp 18

## 2018-02-05 DIAGNOSIS — M21619 Bunion of unspecified foot: Secondary | ICD-10-CM | POA: Insufficient documentation

## 2018-02-05 DIAGNOSIS — M216X9 Other acquired deformities of unspecified foot: Secondary | ICD-10-CM | POA: Diagnosis not present

## 2018-02-05 DIAGNOSIS — Z9889 Other specified postprocedural states: Secondary | ICD-10-CM

## 2018-02-05 MED ORDER — OXYCODONE-ACETAMINOPHEN 5-325 MG PO TABS: 1.0000 | ORAL_TABLET | Freq: Four times a day (QID) | ORAL | 0 refills | Status: DC | PRN

## 2018-02-05 NOTE — Progress Notes (Signed)
Subjective: Rachael Oliver is a 27 y.o. is seen today in office s/p RIGHT Austin bunionecotmy preformed on 01/31/2018.  She states that she still has some pain mostly at nighttime is when she takes majority pain medicine.  She is asking for refill of pain medicine today.  She does state that she was on her feet quite a bit yesterday she was cleaning her house.  She does try to keep ice and elevate it as much as possible however.  She also states that she did trip yesterday.  Denies any systemic complaints such as fevers, chills, nausea, vomiting. No calf pain, chest pain, shortness of breath.   Objective: General: No acute distress, AAOx3  DP/PT pulses palpable 2/4, CRT < 3 sec to all digits.  Protective sensation intact. Motor function intact.  RIGHTfoot: Incision is well coapted without any evidence of dehiscence and suture ends are intact.  There is faint surrounding erythema but there is no ascending cellulitis or increase in warmth to the surgical site.  This erythema is likely more from inflammation as opposed to infection.  There is mild edema around the surgical site. There is mild pain along the surgical site. Toe is in rectus position.  No other areas of tenderness to bilateral lower extremities.  No other open lesions or pre-ulcerative lesions.  No pain with calf compression, swelling, warmth, erythema.   Assessment and Plan:  Status post RIGHT Austin bunionectomy, doing well with no complications   -Treatment options discussed including all alternatives, risks, and complications -X-rays were obtained and reviewed with the patient.  Hardware intact status post first metatarsal osteotomy.  There is no evidence of acute fracture or otherwise. -Antibiotic ointment was applied followed by a bandage.  Keep the dressing clean, dry, intact. -Weightbearing as tolerated in cam boot but I discussed with her significant activity level. -Ice/elevation -Pain medication as needed-refill  Percocet -Monitor for any clinical signs or symptoms of infection and DVT/PE and directed to call the office immediately should any occur or go to the ER. -Follow-up in 1 week or sooner if any problems arise. In the meantime, encouraged to call the office with any questions, concerns, change in symptoms.   Ovid CurdMatthew Consuelo Thayne, DPM

## 2018-02-12 ENCOUNTER — Ambulatory Visit (INDEPENDENT_AMBULATORY_CARE_PROVIDER_SITE_OTHER): Payer: Medicaid Other | Admitting: Podiatry

## 2018-02-12 DIAGNOSIS — M21619 Bunion of unspecified foot: Secondary | ICD-10-CM

## 2018-02-12 DIAGNOSIS — Z9889 Other specified postprocedural states: Secondary | ICD-10-CM

## 2018-02-15 NOTE — Progress Notes (Signed)
Subjective: Rachael Oliver is a 27 y.o. is seen today in office s/p RIGHT Austin bunionecotmy preformed on 01/31/2018.  She states that she is doing well but she does not with that she is trying to walk barefoot she has some pain when doing this.  She is also been walking quite a bit.  Denies any recent injury or trauma.  There is also mild swelling but denies any redness or any red streaks. Denies any systemic complaints such as fevers, chills, nausea, vomiting. No calf pain, chest pain, shortness of breath.   Objective: General: No acute distress, AAOx3  DP/PT pulses palpable 2/4, CRT < 3 sec to all digits.  Protective sensation intact. Motor function intact.  RIGHT foot: Incision is well coapted without any evidence of dehiscence and sutures are intact and there is no evidence of dehiscence.  There is no surrounding erythema and there is no ascending cellulitis or increase in warmth to the surgical site.   There is mild edema around the surgical site. There is mild pain along the surgical site. Toe is in rectus position.  No other areas of tenderness to bilateral lower extremities.  No other open lesions or pre-ulcerative lesions.  No pain with calf compression, swelling, warmth, erythema.   Assessment and Plan:  Status post RIGHT Austin bunionectomy, doing well with no complications   -Treatment options discussed including all alternatives, risks, and complications -Antibiotic ointment was applied to the incision followed by dry sterile dressing.  Keep the dressing clean, dry, intact.  I discussed with her that she needs to be in the cam boot at all times and she should not walk barefoot.  Continue to ice and elevate.  Pain medication as needed. Bunion splint to help hold the toe in a rectus position during healing. Monitor for any clinical signs or symptoms of infection and directed to call the office immediately should any occur or go to the ER. -RTC 1 week for suture removal or sooner if  needed  Vivi BarrackMatthew R Wagoner DPM

## 2018-02-19 ENCOUNTER — Encounter: Payer: Medicaid Other | Admitting: Podiatry

## 2018-02-23 ENCOUNTER — Inpatient Hospital Stay (HOSPITAL_COMMUNITY)
Admission: AD | Admit: 2018-02-23 | Discharge: 2018-02-23 | Disposition: A | Discharge status: Home / Self Care | Payer: Medicaid Other | Source: Ambulatory Visit | Attending: Family Medicine | Admitting: Family Medicine

## 2018-02-23 ENCOUNTER — Encounter (HOSPITAL_COMMUNITY): Payer: Self-pay | Admitting: *Deleted

## 2018-02-23 DIAGNOSIS — N912 Amenorrhea, unspecified: Secondary | ICD-10-CM | POA: Insufficient documentation

## 2018-02-23 NOTE — MAU Note (Signed)
Late starting period. Periods usually regular. Have gotten 3 positive pregnancy tests. Denies any pain or bleeding. Was on Depo until about this time last yr and no birth control since. Just wants to confirm pregnancy and see how far along.

## 2018-02-23 NOTE — MAU Provider Note (Signed)
[name] is a 27 y.o. [phone] who present to MAU today for pregnancy confirmation. She denies abdominal pain or vaginal bleeding. She has taken 3 home pregnancy test that were positive.  BP 109/66 (BP Location: Right Arm)   Pulse 94   Temp 98.9 F (37.2 C)   Resp 18   Ht 5\' 4"  (1.626 m)   Wt 138 lb (62.6 kg)   LMP 01/18/2018   BMI 23.69 kg/m  CONSTITUTIONAL: Well-developed, well-nourished female in no acute distress.  CARDIOVASCULAR: Normal heart rate noted RESPIRATORY: Effort and breath sounds normal GASTROINTESTINAL:Soft, no distention noted.  No tenderness, rebound or guarding.  SKIN: Skin is warm and dry. No rash noted. Not diaphoretic. No erythema. No pallor. PSYCHIATRIC: Normal mood and affect. Normal behavior. Normal judgment and thought content.  MDM Medical screening exam complete Patient does not endorse any symptoms concerning for ectopic pregnancy or pregnancy related complication today.   A:  Amenorrhea  P: Discharge home Patient advised that she can present as a walk-in to [facility] for a pregnancy test M-Th between 8am-4pm or Friday between 8am -11am Reasons to return to MAU reviewed  Patient may return to MAU as needed or if her condition were to change or worsen Safe medication during pregnancy list given   Sharyon CableRogers, Tamitha Norell C, CNM 02/23/2018 11:03 PM

## 2018-02-23 NOTE — MAU Note (Signed)
Steward DroneVeronica Rogers CNM in Triage to talk with pt. Pt then d/c home from Triage by CNM

## 2018-02-26 ENCOUNTER — Encounter: Payer: Self-pay | Admitting: *Deleted

## 2018-02-26 ENCOUNTER — Encounter: Payer: Self-pay | Admitting: Obstetrics

## 2018-02-26 ENCOUNTER — Ambulatory Visit (INDEPENDENT_AMBULATORY_CARE_PROVIDER_SITE_OTHER): Payer: Medicaid Other | Admitting: Obstetrics

## 2018-02-26 VITALS — BP 108/64 | HR 73 | Wt 141.0 lb

## 2018-02-26 DIAGNOSIS — Z3201 Encounter for pregnancy test, result positive: Secondary | ICD-10-CM | POA: Diagnosis not present

## 2018-02-26 DIAGNOSIS — Z3687 Encounter for antenatal screening for uncertain dates: Secondary | ICD-10-CM

## 2018-02-26 DIAGNOSIS — N926 Irregular menstruation, unspecified: Secondary | ICD-10-CM

## 2018-02-26 LAB — POCT URINE PREGNANCY: Preg Test, Ur: POSITIVE — AB

## 2018-02-26 MED ORDER — PRENATE MINI 29-0.6-0.4-350 MG PO CAPS: 1.0000 | ORAL_CAPSULE | Freq: Every day | ORAL | 4 refills | Status: DC

## 2018-02-26 NOTE — Progress Notes (Signed)
RGYN patient presents for STD and request pregnancy test.   CC: None + at home UPT  Test in office positive

## 2018-02-26 NOTE — Progress Notes (Signed)
Patient ID: Rachael Oliver, female   DOB: 1991/07/18, 27 y.o.   MRN: [id number]  Chief Complaint  Patient presents with  . Amenorrhea    UPT +    HPI Rachael Oliver is a 27 y.o. female.  Missed period.  Positive UPT at home. HPI  Past Medical History:  Diagnosis Date  . Asthma    as a child  . History of methicillin resistant staphylococcus aureus (MRSA)   . Infection    left elbow  . Syncope   . Vaginal Pap smear, abnormal     Past Surgical History:  Procedure Laterality Date  . ELBOW SURGERY Left   . FOOT SURGERY    . GYNECOLOGIC CRYOSURGERY    . I&D EXTREMITY Left 12/03/2015   Procedure: IRRIGATION AND DEBRIDEMENT LEFT ELBOW;  Surgeon: Tarry Kos, MD;  Location: [facility] OR;  Service: Orthopedics;  Laterality: Left;  . uterine biopsy      Family History  Problem Relation Age of Onset  . Alcohol abuse Neg Hx   . Arthritis Neg Hx   . Asthma Neg Hx   . Birth defects Neg Hx   . COPD Neg Hx   . Cancer Neg Hx   . Depression Neg Hx   . Diabetes Neg Hx   . Drug abuse Neg Hx   . Early death Neg Hx   . Hearing loss Neg Hx   . Heart disease Neg Hx   . Hyperlipidemia Neg Hx   . Hypertension Neg Hx   . Kidney disease Neg Hx   . Learning disabilities Neg Hx   . Mental illness Neg Hx   . Mental retardation Neg Hx   . Miscarriages / Stillbirths Neg Hx   . Stroke Neg Hx   . Vision loss Neg Hx     Social History Social History   Tobacco Use  . Smoking status: Never Smoker  . Smokeless tobacco: Never Used  Substance Use Topics  . Alcohol use: No    Alcohol/week: 0.0 oz  . Drug use: No    No Known Allergies  Current Outpatient Medications  Medication Sig Dispense Refill  . Prenat w/o A-FeCbn-Meth-FA-DHA (PRENATE MINI) 29-0.6-0.4-350 MG CAPS Take 1 capsule by mouth daily before breakfast. 90 capsule 4   No current facility-administered medications for this visit.     Review of Systems Review of Systems Constitutional: POSITIVE for fatigue  Respiratory:  negative for cough and wheezing Cardiovascular: negative for chest pain, fatigue and palpitations Gastrointestinal: negative for abdominal pain and change in bowel habits Genitourinary:negative Integument/breast: negative for nipple discharge Musculoskeletal:negative for myalgias Neurological: negative for gait problems and tremors Behavioral/Psych: negative for abusive relationship, depression Endocrine: negative for temperature intolerance      Blood pressure 108/64, pulse 73, weight 141 lb (64 kg).  Physical Exam Physical Exam:  Deferred  >50% of 10 min visit spent on counseling and coordination of care.   Data Reviewed UPT  Assessment   1. Missed period Rx: - POCT urine pregnancy - Prenat w/o A-FeCbn-Meth-FA-DHA (PRENATE MINI) 29-0.6-0.4-350 MG CAPS; Take 1 capsule by mouth daily before breakfast.  Dispense: 90 capsule; Refill: 4 - US OB Comp Less 14 Wks; Future  2. Unsure of LMP (last menstrual period) as reason for ultrasound scan Rx: - US OB Comp Less 14 Wks; Future  3. Pregnancy test positive     Plan    FOLLOW UP IN 6 WEEKS FOR NOB VISIT  Orders Placed This Encounter  Procedures  .  US OB Comp Less 14 Wks    Standing Status:   Future    Standing Expiration Date:   04/29/2019    Order Specific Question:   Reason for Exam (SYMPTOM  OR DIAGNOSIS REQUIRED)    Answer:   Unsure LMP    Order Specific Question:   Preferred Imaging Location?    [facility]   [facility]  . POCT urine pregnancy   Meds ordered this encounter  Medications  . Prenat w/o A-FeCbn-Meth-FA-DHA (PRENATE MINI) 29-0.6-0.4-350 MG CAPS    Sig: Take 1 capsule by mouth daily before breakfast.    Dispense:  90 capsule    Refill:  4      Brock BadHARLES A. HARPER MD 02-26-2018

## 2018-03-19 ENCOUNTER — Ambulatory Visit (HOSPITAL_COMMUNITY)
Admission: RE | Admit: 2018-03-19 | Discharge: 2018-03-19 | Disposition: A | Discharge status: Home / Self Care | Payer: Medicaid Other | Source: Ambulatory Visit | Attending: Obstetrics | Admitting: Obstetrics

## 2018-03-19 ENCOUNTER — Other Ambulatory Visit: Payer: Self-pay

## 2018-03-19 DIAGNOSIS — Z3A08 8 weeks gestation of pregnancy: Secondary | ICD-10-CM | POA: Insufficient documentation

## 2018-03-19 DIAGNOSIS — N926 Irregular menstruation, unspecified: Secondary | ICD-10-CM | POA: Insufficient documentation

## 2018-03-19 DIAGNOSIS — Z3687 Encounter for antenatal screening for uncertain dates: Secondary | ICD-10-CM

## 2018-03-19 DIAGNOSIS — B9689 Other specified bacterial agents as the cause of diseases classified elsewhere: Secondary | ICD-10-CM

## 2018-03-19 DIAGNOSIS — A5901 Trichomonal vulvovaginitis: Secondary | ICD-10-CM

## 2018-03-19 DIAGNOSIS — Z3201 Encounter for pregnancy test, result positive: Secondary | ICD-10-CM | POA: Diagnosis present

## 2018-03-19 DIAGNOSIS — N760 Acute vaginitis: Secondary | ICD-10-CM

## 2018-03-19 MED ORDER — TINIDAZOLE 500 MG PO TABS: 2.0000 g | ORAL_TABLET | Freq: Every day | ORAL | 0 refills | Status: DC

## 2018-03-28 ENCOUNTER — Encounter: Payer: Self-pay | Admitting: Obstetrics

## 2018-03-30 ENCOUNTER — Other Ambulatory Visit (HOSPITAL_COMMUNITY)
Admission: RE | Admit: 2018-03-30 | Discharge: 2018-03-30 | Disposition: A | Discharge status: Home / Self Care | Payer: Medicaid Other | Source: Ambulatory Visit | Attending: Obstetrics | Admitting: Obstetrics

## 2018-03-30 ENCOUNTER — Ambulatory Visit (INDEPENDENT_AMBULATORY_CARE_PROVIDER_SITE_OTHER): Payer: Medicaid Other | Admitting: Obstetrics

## 2018-03-30 ENCOUNTER — Encounter: Payer: Self-pay | Admitting: Obstetrics

## 2018-03-30 VITALS — BP 109/66 | HR 60 | Wt 134.0 lb

## 2018-03-30 DIAGNOSIS — O0991 Supervision of high risk pregnancy, unspecified, first trimester: Secondary | ICD-10-CM

## 2018-03-30 DIAGNOSIS — K224 Dyskinesia of esophagus: Secondary | ICD-10-CM

## 2018-03-30 DIAGNOSIS — Z8751 Personal history of pre-term labor: Secondary | ICD-10-CM

## 2018-03-30 DIAGNOSIS — O219 Vomiting of pregnancy, unspecified: Secondary | ICD-10-CM

## 2018-03-30 DIAGNOSIS — O0990 Supervision of high risk pregnancy, unspecified, unspecified trimester: Secondary | ICD-10-CM

## 2018-03-30 DIAGNOSIS — N898 Other specified noninflammatory disorders of vagina: Secondary | ICD-10-CM | POA: Diagnosis present

## 2018-03-30 DIAGNOSIS — O09211 Supervision of pregnancy with history of pre-term labor, first trimester: Secondary | ICD-10-CM

## 2018-03-30 MED ORDER — VITAFOL GUMMIES 3.33-0.333-34.8 MG PO CHEW: 3.0000 | CHEWABLE_TABLET | Freq: Every day | ORAL | 4 refills | Status: DC

## 2018-03-30 MED ORDER — DICYCLOMINE HCL 10 MG PO CAPS: 10.0000 mg | ORAL_CAPSULE | Freq: Three times a day (TID) | ORAL | 5 refills | Status: DC

## 2018-03-30 NOTE — Progress Notes (Signed)
Subjective:    Rachael Oliver is being seen today for her first obstetrical visit.  This is not a planned pregnancy. She is at [redacted]w[redacted]d gestation. Her obstetrical history is significant for history of preterm delivery. Relationship with FOB: significant other, living together. Patient does intend to breast feed. Pregnancy history fully reviewed.  The information documented in the HPI was reviewed and verified.  Menstrual History: OB History    Gravida  4   Para  3   Term  2   Preterm  1   AB      Living  3     SAB      TAB      Ectopic      Multiple      Live Births  3            Patient's last menstrual period was 01/19/2018.    Past Medical History:  Diagnosis Date  . Asthma    as a child  . History of methicillin resistant staphylococcus aureus (MRSA)   . Infection    left elbow  . Syncope   . Vaginal Pap smear, abnormal     Past Surgical History:  Procedure Laterality Date  . ELBOW SURGERY Left   . FOOT SURGERY    . GYNECOLOGIC CRYOSURGERY    . I&D EXTREMITY Left 12/03/2015   Procedure: IRRIGATION AND DEBRIDEMENT LEFT ELBOW;  Surgeon: Tarry Kos, MD;  Location: [facility] OR;  Service: Orthopedics;  Laterality: Left;  . uterine biopsy       (Not in a hospital admission) No Known Allergies  Social History   Tobacco Use  . Smoking status: Never Smoker  . Smokeless tobacco: Never Used  Substance Use Topics  . Alcohol use: No    Alcohol/week: 0.0 oz    Family History  Problem Relation Age of Onset  . Diabetes Mother   . Alcohol abuse Neg Hx   . Arthritis Neg Hx   . Asthma Neg Hx   . Birth defects Neg Hx   . COPD Neg Hx   . Cancer Neg Hx   . Depression Neg Hx   . Drug abuse Neg Hx   . Early death Neg Hx   . Hearing loss Neg Hx   . Heart disease Neg Hx   . Hyperlipidemia Neg Hx   . Hypertension Neg Hx   . Kidney disease Neg Hx   . Learning disabilities Neg Hx   . Mental illness Neg Hx   . Mental retardation Neg Hx   . Miscarriages /  Stillbirths Neg Hx   . Stroke Neg Hx   . Vision loss Neg Hx      Review of Systems Constitutional: negative for weight loss Gastrointestinal: negative for vomiting Genitourinary:negative for genital lesions and vaginal discharge and dysuria Musculoskeletal:negative for back pain Behavioral/Psych: negative for abusive relationship, depression, illegal drug usage and tobacco use    Objective:    BP 109/66   Pulse 60   Wt 134 lb (60.8 kg)   LMP 01/19/2018   BMI 23.00 kg/m  General Appearance:    Alert, cooperative, no distress, appears stated age  Head:    Normocephalic, without obvious abnormality, atraumatic  Eyes:    PERRL, conjunctiva/corneas clear, EOM's intact, fundi    benign, both eyes  Ears:    Normal TM's and external ear canals, both ears  Nose:   Nares normal, septum midline, mucosa normal, no drainage    or sinus tenderness  Throat:   Lips, mucosa, and tongue normal; teeth and gums normal  Neck:   Supple, symmetrical, trachea midline, no adenopathy;    thyroid:  no enlargement/tenderness/nodules; no carotid   bruit or JVD  Back:     Symmetric, no curvature, ROM normal, no CVA tenderness  Lungs:     Clear to auscultation bilaterally, respirations unlabored  Chest Wall:    No tenderness or deformity   Heart:    Regular rate and rhythm, S1 and S2 normal, no murmur, rub   or gallop  Breast Exam:    No tenderness, masses, or nipple abnormality  Abdomen:     Soft, non-tender, bowel sounds active all four quadrants,    no masses, no organomegaly  Genitalia:    Normal female without lesion, discharge or tenderness  Extremities:   Extremities normal, atraumatic, no cyanosis or edema  Pulses:   2+ and symmetric all extremities  Skin:   Skin color, texture, turgor normal, no rashes or lesions  Lymph nodes:   Cervical, supraclavicular, and axillary nodes normal  Neurologic:   CNII-XII intact, normal strength, sensation and reflexes    throughout      Lab Review Urine  pregnancy test Labs reviewed yes Radiologic studies reviewed yes   Assessment:    Pregnancy at [redacted]w[redacted]d weeks    Plan:     1. Supervision of high risk pregnancy, antepartum Rx: - Obstetric Panel, Including HIV - Culture, OB Urine - Genetic Screening - Hemoglobinopathy evaluation - VITAMIN D 25 Hydroxy (Vit-D Deficiency, Fractures) - Hemoglobin A1c - SMN1 COPY NUMBER ANALYSIS (SMA Carrier Screen) - Enroll Patient in Babyscripts - Prenatal Vit-Fe Phos-FA-Omega (VITAFOL GUMMIES) 3.33-0.333-34.8 MG CHEW; Chew 3 tablets by mouth daily before breakfast.  Dispense: 90 tablet; Refill: 4  2. History of preterm delivery - start 17-P at 16 weeks  3. Vaginal discharge Rx: - Cervicovaginal ancillary only  4. Esophageal dysmotility Rx: - dicyclomine (BENTYL) 10 MG capsule; Take 1 capsule (10 mg total) by mouth 4 (four) times daily -  before meals and at bedtime.  Dispense: 90 capsule; Refill: 5   Prenatal vitamins.  Counseling provided regarding continued use of seat belts, cessation of alcohol consumption, smoking or use of illicit drugs; infection precautions i.e., influenza/TDAP immunizations, toxoplasmosis,CMV, parvovirus, listeria and varicella; workplace safety, exercise during pregnancy; routine dental care, safe medications, sexual activity, hot tubs, saunas, pools, travel, caffeine use, fish and methlymercury, potential toxins, hair treatments, varicose veins Weight gain recommendations per IOM guidelines reviewed: underweight/BMI< 18.5--> gain 28 - 40 lbs; normal weight/BMI 18.5 - 24.9--> gain 25 - 35 lbs; overweight/BMI 25 - 29.9--> gain 15 - 25 lbs; obese/BMI >30->gain  11 - 20 lbs Problem list reviewed and updated. FIRST/CF mutation testing/NIPT/QUAD SCREEN/fragile X/Ashkenazi Jewish population testing/Spinal muscular atrophy discussed: requested. Role of ultrasound in pregnancy discussed; fetal survey: requested. Amniocentesis discussed: not indicated.  Meds ordered this  encounter  Medications  . Prenatal Vit-Fe Phos-FA-Omega (VITAFOL GUMMIES) 3.33-0.333-34.8 MG CHEW    Sig: Chew 3 tablets by mouth daily before breakfast.    Dispense:  90 tablet    Refill:  4  . dicyclomine (BENTYL) 10 MG capsule    Sig: Take 1 capsule (10 mg total) by mouth 4 (four) times daily -  before meals and at bedtime.    Dispense:  90 capsule    Refill:  5   Orders Placed This Encounter  Procedures  . Culture, OB Urine  . Obstetric Panel, Including HIV  . Genetic Screening  [place]  . Hemoglobinopathy evaluation  . VITAMIN D 25 Hydroxy (Vit-D Deficiency, Fractures)  . Hemoglobin A1c  . SMN1 COPY NUMBER ANALYSIS (SMA Carrier Screen)    Follow up in 5 weeks. 50% of 20 min visit spent on counseling and coordination of care.     Brock Bad MD 03-30-2018

## 2018-03-30 NOTE — Progress Notes (Signed)
Pt presents for NOB. This is not a planned pregnancy and she is living with FOB.

## 2018-04-01 ENCOUNTER — Encounter: Payer: Self-pay | Admitting: Obstetrics & Gynecology

## 2018-04-01 DIAGNOSIS — O09219 Supervision of pregnancy with history of pre-term labor, unspecified trimester: Secondary | ICD-10-CM | POA: Insufficient documentation

## 2018-04-01 DIAGNOSIS — K224 Dyskinesia of esophagus: Secondary | ICD-10-CM | POA: Insufficient documentation

## 2018-04-03 LAB — CERVICOVAGINAL ANCILLARY ONLY
BACTERIAL VAGINITIS: NEGATIVE
CANDIDA VAGINITIS: NEGATIVE
Chlamydia: NEGATIVE
Neisseria Gonorrhea: NEGATIVE
TRICH (WINDOWPATH): NEGATIVE

## 2018-04-04 ENCOUNTER — Other Ambulatory Visit: Payer: Self-pay | Admitting: Obstetrics

## 2018-04-04 DIAGNOSIS — N3000 Acute cystitis without hematuria: Secondary | ICD-10-CM

## 2018-04-04 LAB — URINE CULTURE, OB REFLEX

## 2018-04-04 LAB — CULTURE, OB URINE

## 2018-04-04 MED ORDER — NITROFURANTOIN MONOHYD MACRO 100 MG PO CAPS
100.0000 mg | ORAL_CAPSULE | Freq: Two times a day (BID) | ORAL | 0 refills | Status: DC
Start: 2018-04-04 — End: 2018-06-21

## 2018-04-05 ENCOUNTER — Encounter: Payer: Self-pay | Admitting: Obstetrics

## 2018-04-06 LAB — SMN1 COPY NUMBER ANALYSIS (SMA CARRIER SCREENING)

## 2018-04-06 LAB — OBSTETRIC PANEL, INCLUDING HIV
Antibody Screen: NEGATIVE
BASOS ABS: 0 10*3/uL (ref 0.0–0.2)
Basos: 0 %
EOS (ABSOLUTE): 0 10*3/uL (ref 0.0–0.4)
Eos: 1 %
HIV SCREEN 4TH GENERATION: NONREACTIVE
Hematocrit: 40.4 % (ref 34.0–46.6)
Hemoglobin: 12.7 g/dL (ref 11.1–15.9)
Hepatitis B Surface Ag: NEGATIVE
IMMATURE GRANULOCYTES: 0 %
Immature Grans (Abs): 0 10*3/uL (ref 0.0–0.1)
LYMPHS ABS: 1.3 10*3/uL (ref 0.7–3.1)
Lymphs: 46 %
MCH: 26.7 pg (ref 26.6–33.0)
MCHC: 31.4 g/dL — ABNORMAL LOW (ref 31.5–35.7)
MCV: 85 fL (ref 79–97)
MONOCYTES: 22 %
Monocytes Absolute: 0.6 10*3/uL (ref 0.1–0.9)
NEUTROS PCT: 31 %
Neutrophils Absolute: 0.9 10*3/uL — ABNORMAL LOW (ref 1.4–7.0)
PLATELETS: 223 10*3/uL (ref 150–450)
RBC: 4.76 x10E6/uL (ref 3.77–5.28)
RDW: 14.4 % (ref 12.3–15.4)
RPR Ser Ql: NONREACTIVE
RUBELLA: 1.03 {index} (ref >0.99)
Rh Factor: POSITIVE
WBC: 2.8 10*3/uL — ABNORMAL LOW (ref 3.4–10.8)

## 2018-04-06 LAB — HEMOGLOBINOPATHY EVALUATION
HEMOGLOBIN A2 QUANTITATION: 2.3 % (ref 1.8–3.2)
HEMOGLOBIN F QUANTITATION: 0 % (ref 0.0–2.0)
HGB A: 97.7 % (ref 96.4–98.8)
HGB C: 0 %
HGB S: 0 %
HGB VARIANT: 0 %

## 2018-04-06 LAB — HEMOGLOBIN A1C
Est. average glucose Bld gHb Est-mCnc: 97 mg/dL
Hgb A1c MFr Bld: 5 % (ref 4.8–5.6)

## 2018-04-06 LAB — VITAMIN D 25 HYDROXY (VIT D DEFICIENCY, FRACTURES): Vit D, 25-Hydroxy: 12.9 ng/mL — ABNORMAL LOW (ref 30.0–100.0)

## 2018-05-04 ENCOUNTER — Encounter: Payer: Medicaid Other | Admitting: Obstetrics

## 2018-05-23 ENCOUNTER — Ambulatory Visit: Payer: Medicaid Other | Admitting: Obstetrics

## 2018-05-28 ENCOUNTER — Ambulatory Visit: Payer: Medicaid Other | Admitting: Obstetrics

## 2018-06-21 ENCOUNTER — Ambulatory Visit (INDEPENDENT_AMBULATORY_CARE_PROVIDER_SITE_OTHER): Payer: Medicaid Other | Admitting: Obstetrics and Gynecology

## 2018-06-21 ENCOUNTER — Encounter: Payer: Self-pay | Admitting: Obstetrics and Gynecology

## 2018-06-21 ENCOUNTER — Other Ambulatory Visit (HOSPITAL_COMMUNITY)
Admission: RE | Admit: 2018-06-21 | Discharge: 2018-06-21 | Disposition: A | Discharge status: Home / Self Care | Payer: Medicaid Other | Source: Ambulatory Visit | Attending: Obstetrics and Gynecology | Admitting: Obstetrics and Gynecology

## 2018-06-21 DIAGNOSIS — Z202 Contact with and (suspected) exposure to infections with a predominantly sexual mode of transmission: Secondary | ICD-10-CM | POA: Diagnosis present

## 2018-06-21 DIAGNOSIS — A5901 Trichomonal vulvovaginitis: Secondary | ICD-10-CM | POA: Diagnosis not present

## 2018-06-21 NOTE — Patient Instructions (Signed)

## 2018-06-21 NOTE — Addendum Note (Signed)
Addended by: Hamilton CapriBURCH, ARIEL J on: 06/21/2018 03:57 PM   Modules accepted: Orders

## 2018-06-21 NOTE — Progress Notes (Signed)
Ms Rachael Oliver presents for request of STD's. She denies any Sx presently. Is attempting to concieve and just wants to be sure everything is OK.  Had a miscarriage back in May Normal cycles since Sexual active without contraception or problems  PE AF VSS Lungs clear Heart RRR Abd soft + BS GU deferred  A/P STD exposure STD testing as per pt request. Pt will be contacted with test results and treated as indicated.

## 2018-06-22 LAB — HEPATITIS B SURFACE ANTIGEN: Hepatitis B Surface Ag: NEGATIVE

## 2018-06-22 LAB — URINE CYTOLOGY ANCILLARY ONLY
CHLAMYDIA, DNA PROBE: NEGATIVE
Neisseria Gonorrhea: NEGATIVE
TRICH (WINDOWPATH): POSITIVE — AB

## 2018-06-22 LAB — HIV ANTIBODY (ROUTINE TESTING W REFLEX): HIV SCREEN 4TH GENERATION: NONREACTIVE

## 2018-06-22 LAB — RPR: RPR Ser Ql: NONREACTIVE

## 2018-06-22 LAB — HEPATITIS C ANTIBODY: Hep C Virus Ab: <0.1 s/co ratio (ref 0.0–0.9)

## 2018-06-25 ENCOUNTER — Other Ambulatory Visit: Payer: Self-pay

## 2018-06-25 DIAGNOSIS — A599 Trichomoniasis, unspecified: Secondary | ICD-10-CM

## 2018-06-25 MED ORDER — METRONIDAZOLE 500 MG PO TABS: 500.0000 mg | ORAL_TABLET | Freq: Two times a day (BID) | ORAL | 0 refills | Status: DC

## 2018-07-11 ENCOUNTER — Inpatient Hospital Stay (HOSPITAL_COMMUNITY): Payer: Medicaid Other

## 2018-07-11 ENCOUNTER — Encounter (HOSPITAL_COMMUNITY): Payer: Self-pay | Admitting: *Deleted

## 2018-07-11 ENCOUNTER — Ambulatory Visit (INDEPENDENT_AMBULATORY_CARE_PROVIDER_SITE_OTHER): Payer: Medicaid Other | Admitting: *Deleted

## 2018-07-11 ENCOUNTER — Other Ambulatory Visit: Payer: Self-pay

## 2018-07-11 ENCOUNTER — Encounter: Payer: Self-pay | Admitting: Obstetrics

## 2018-07-11 ENCOUNTER — Inpatient Hospital Stay (HOSPITAL_COMMUNITY)
Admission: AD | Admit: 2018-07-11 | Discharge: 2018-07-11 | Disposition: A | Discharge status: Home / Self Care | Payer: Medicaid Other | Source: Ambulatory Visit | Attending: Obstetrics and Gynecology | Admitting: Obstetrics and Gynecology

## 2018-07-11 DIAGNOSIS — J45909 Unspecified asthma, uncomplicated: Secondary | ICD-10-CM | POA: Diagnosis not present

## 2018-07-11 DIAGNOSIS — O209 Hemorrhage in early pregnancy, unspecified: Secondary | ICD-10-CM | POA: Diagnosis not present

## 2018-07-11 DIAGNOSIS — Z3A01 Less than 8 weeks gestation of pregnancy: Secondary | ICD-10-CM | POA: Insufficient documentation

## 2018-07-11 DIAGNOSIS — Z3201 Encounter for pregnancy test, result positive: Secondary | ICD-10-CM

## 2018-07-11 DIAGNOSIS — O3680X0 Pregnancy with inconclusive fetal viability, not applicable or unspecified: Secondary | ICD-10-CM | POA: Diagnosis present

## 2018-07-11 DIAGNOSIS — Z8614 Personal history of Methicillin resistant Staphylococcus aureus infection: Secondary | ICD-10-CM | POA: Diagnosis not present

## 2018-07-11 DIAGNOSIS — R109 Unspecified abdominal pain: Secondary | ICD-10-CM | POA: Diagnosis present

## 2018-07-11 DIAGNOSIS — Z79899 Other long term (current) drug therapy: Secondary | ICD-10-CM | POA: Diagnosis not present

## 2018-07-11 DIAGNOSIS — O99511 Diseases of the respiratory system complicating pregnancy, first trimester: Secondary | ICD-10-CM | POA: Insufficient documentation

## 2018-07-11 DIAGNOSIS — N939 Abnormal uterine and vaginal bleeding, unspecified: Secondary | ICD-10-CM | POA: Diagnosis present

## 2018-07-11 DIAGNOSIS — Z3200 Encounter for pregnancy test, result unknown: Secondary | ICD-10-CM

## 2018-07-11 HISTORY — DX: Trichomoniasis, unspecified: A59.9

## 2018-07-11 LAB — URINALYSIS, ROUTINE W REFLEX MICROSCOPIC
Bilirubin Urine: NEGATIVE
Glucose, UA: NEGATIVE mg/dL
Ketones, ur: NEGATIVE mg/dL
Leukocytes, UA: NEGATIVE
Nitrite: NEGATIVE
PH: 8 (ref 5.0–8.0)
Protein, ur: NEGATIVE mg/dL
SPECIFIC GRAVITY, URINE: 1.015 (ref 1.005–1.030)

## 2018-07-11 LAB — HCG, QUANTITATIVE, PREGNANCY: HCG, BETA CHAIN, QUANT, S: 18142 m[IU]/mL — AB (ref <5)

## 2018-07-11 LAB — CBC
HCT: 35.7 % — ABNORMAL LOW (ref 36.0–46.0)
HEMOGLOBIN: 11.8 g/dL — AB (ref 12.0–15.0)
MCH: 27.3 pg (ref 26.0–34.0)
MCHC: 33.1 g/dL (ref 30.0–36.0)
MCV: 82.6 fL (ref 78.0–100.0)
Platelets: 218 10*3/uL (ref 150–400)
RBC: 4.32 MIL/uL (ref 3.87–5.11)
RDW: 13.6 % (ref 11.5–15.5)
WBC: 4.2 10*3/uL (ref 4.0–10.5)

## 2018-07-11 LAB — ABO/RH: ABO/RH(D): B POS

## 2018-07-11 LAB — POCT URINE PREGNANCY: Preg Test, Ur: POSITIVE — AB

## 2018-07-11 MED ORDER — VITAFOL GUMMIES 3.33-0.333-34.8 MG PO CHEW: 3.0000 | CHEWABLE_TABLET | Freq: Every day | ORAL | 11 refills | Status: DC

## 2018-07-11 NOTE — MAU Note (Addendum)
Just found out she is pregnant today.  Started bleeding yesterday, cramping like a period-started today..  Told the dr this morning, they said if she had more bleeding or if the pain got worse, to go to the hospital.  Had the runs since Sunday

## 2018-07-11 NOTE — MAU Provider Note (Signed)
History     CSN: [id number]  Arrival date and time: 07/11/18 1528   First Provider Initiated Contact with Patient 07/11/18 1643      Chief Complaint  Patient presents with  . Vaginal Bleeding  . Abdominal Pain   HPI  [name] is a 27 y.o. year old [id number] female at [redacted]w[redacted]d weeks gestation who presents to MAU reporting (+) UPT today, occasional vaginal spotting. She was seen at [facility] today for a pregnancy test with complaints of occasional spotting without abdominal pain/cramping. She was advised that if spotting got worse, BRB and/or severe cramping, to come to MAU for evaluation.    Past Medical History:  Diagnosis Date  . Asthma    as a child  . History of methicillin resistant staphylococcus aureus (MRSA)   . Infection    left elbow  . Preterm labor   . Syncope   . Trichomonosis   . Vaginal Pap smear, abnormal     Past Surgical History:  Procedure Laterality Date  . ELBOW SURGERY Left   . FOOT SURGERY    . GYNECOLOGIC CRYOSURGERY    . I&D EXTREMITY Left 12/03/2015   Procedure: IRRIGATION AND DEBRIDEMENT LEFT ELBOW;  Surgeon: Tarry Kos, MD;  Location: [facility] OR;  Service: Orthopedics;  Laterality: Left;  . uterine biopsy      Family History  Problem Relation Age of Onset  . Diabetes Mother   . Alcohol abuse Neg Hx   . Arthritis Neg Hx   . Asthma Neg Hx   . Birth defects Neg Hx   . COPD Neg Hx   . Cancer Neg Hx   . Depression Neg Hx   . Drug abuse Neg Hx   . Early death Neg Hx   . Hearing loss Neg Hx   . Heart disease Neg Hx   . Hyperlipidemia Neg Hx   . Hypertension Neg Hx   . Kidney disease Neg Hx   . Learning disabilities Neg Hx   . Mental illness Neg Hx   . Mental retardation Neg Hx   . Miscarriages / Stillbirths Neg Hx   . Stroke Neg Hx   . Vision loss Neg Hx     Social History   Tobacco Use  . Smoking status: Never Smoker  . Smokeless tobacco: Never Used  Substance Use Topics  . Alcohol use: No    Alcohol/week: 0.0 standard drinks   . Drug use: No    Allergies: Not on File  Medications Prior to Admission  Medication Sig Dispense Refill Last Dose  . Prenatal Vit-Fe Phos-FA-Omega (VITAFOL GUMMIES) 3.33-0.333-34.8 MG CHEW Chew 3 each by mouth daily. 90 tablet 11     Review of Systems  Constitutional: Negative.   HENT: Negative.   Eyes: Negative.   Respiratory: Negative.   Cardiovascular: Negative.   Gastrointestinal: Negative.   Endocrine: Negative.   Genitourinary: Positive for vaginal bleeding (spotting).  Musculoskeletal: Negative.   Skin: Negative.   Allergic/Immunologic: Negative.   Neurological: Negative.   Hematological: Negative.   Psychiatric/Behavioral: Negative.    Physical Exam   Blood pressure 108/61, pulse 69, temperature 98.1 F (36.7 C), temperature source Oral, resp. rate 16, weight 62.3 kg, last menstrual period 05/23/2018, SpO2 100 %.  Physical Exam  Nursing note and vitals reviewed. Constitutional: She is oriented to person, place, and time. She appears well-developed and well-nourished.  HENT:  Head: Normocephalic and atraumatic.  Eyes: Pupils are equal, round, and reactive to light.  Neck: Normal range of motion.  Cardiovascular: Normal rate.  Respiratory: Effort normal.  GI: Soft.  Genitourinary:  Genitourinary Comments: Uterus: non-tender, SE: cervix is smooth, pink, no lesions, scant amt of red blood in vaginal vault -- closed/long/firm, no CMT or friability, no adnexal tenderness   Musculoskeletal: Normal range of motion.  Neurological: She is alert and oriented to person, place, and time.  Skin: Skin is warm and dry.  Psychiatric: She has a normal mood and affect. Her behavior is normal. Judgment and thought content normal.    MAU Course  Procedures  MDM CCUA UPT CBC ABO/Rh HCG OB < 14 wks [place] with TV  Results for orders placed or performed during the hospital encounter of 07/11/18 (from the past 24 hour(s))  Urinalysis, Routine w reflex microscopic      Status: Abnormal   Collection Time: 07/11/18  4:04 PM  Result Value Ref Range   Color, Urine YELLOW YELLOW   APPearance HAZY (A) CLEAR   Specific Gravity, Urine 1.015 1.005 - 1.030   pH 8.0 5.0 - 8.0   Glucose, UA NEGATIVE NEGATIVE mg/dL   Hgb urine dipstick LARGE (A) NEGATIVE   Bilirubin Urine NEGATIVE NEGATIVE   Ketones, ur NEGATIVE NEGATIVE mg/dL   Protein, ur NEGATIVE NEGATIVE mg/dL   Nitrite NEGATIVE NEGATIVE   Leukocytes, UA NEGATIVE NEGATIVE   RBC / HPF 0-5 0 - 5 RBC/hpf   WBC, UA 0-5 0 - 5 WBC/hpf   Bacteria, UA RARE (A) NONE SEEN   Squamous Epithelial / LPF 6-10 0 - 5  CBC     Status: Abnormal   Collection Time: 07/11/18  5:35 PM  Result Value Ref Range   WBC 4.2 4.0 - 10.5 K/uL   RBC 4.32 3.87 - 5.11 MIL/uL   Hemoglobin 11.8 (L) 12.0 - 15.0 g/dL   HCT 92.0 (L) 10.0 - 71.2 %   MCV 82.6 78.0 - 100.0 fL   MCH 27.3 26.0 - 34.0 pg   MCHC 33.1 30.0 - 36.0 g/dL   RDW 19.7 58.8 - 32.5 %   Platelets 218 150 - 400 K/uL  ABO/Rh     Status: None   Collection Time: 07/11/18  5:35 PM  Result Value Ref Range   ABO/RH(D)      B POS Performed at [facility], [address], [place], [place] [address]   hCG, quantitative, pregnancy     Status: Abnormal   Collection Time: 07/11/18  5:35 PM  Result Value Ref Range   hCG, Beta Chain, Quant, S 18,142 (H) <5 mIU/mL    US Ob Less Than 14 Weeks With Ob Transvaginal  Result Date: 07/11/2018 CLINICAL DATA:  Bleeding and cramping with positive urine pregnancy test EXAM: OBSTETRIC <14 WK [place] AND TRANSVAGINAL OB US TECHNIQUE: Both transabdominal and transvaginal ultrasound examinations were performed for complete evaluation of the gestation as well as the maternal uterus, adnexal regions, and pelvic cul-de-sac. Transvaginal technique was performed to assess early pregnancy. COMPARISON:  None. FINDINGS: Intrauterine gestational sac: Single intrauterine gestation Yolk sac:  Visible Embryo:  Not visible MSD: 9.3 mm   5 w   5 d Subchorionic  hemorrhage:  None visualized. Maternal uterus/adnexae: Ovaries are within normal limits. Right ovary measures 2.5 by 3 x 2.6 cm. The left ovary measures 1.6 x 2.9 x 1.4 cm. No significant free fluid. IMPRESSION: Single intrauterine pregnancy with visible gestational sac and yolk sac but no embryo. Consider ultrasound follow-up in 10-14 days to confirm viability. Electronically Signed  By: Jasmine Pang M.D.   On: 07/11/2018 18:37   Assessment and Plan  Pregnancy with uncertain fetal viability, not applicable or unspecified fetus - Keep scheduled appt with [facility] on 08/08/18  Bleeding in early pregnancy  - Information provided on VB during pregnancy & threatened miscarriage - Advised to return to MAU for bleeding like a period, soaking a pad every 1 hr and/or pain  - Discharge home - Patient verbalized an understanding of the plan of care and agrees.   Raelyn Mora, MSN, CNM 07/11/2018, 4:47 PM

## 2018-07-11 NOTE — Progress Notes (Signed)
Ms. Antar presents today for UPT. She has some occasional spotting, denies pain/cramping.  LMP: July 17,2019    OBJECTIVE: Appears well, in no apparent distress.  OB History    Gravida  4   Para  3   Term  2   Preterm  1   AB  1   Living  3     SAB  1   TAB      Ectopic      Multiple      Live Births  3          Home UPT Result: Positive In-Office UPT result: Positive I have reviewed the patient's medical, obstetrical, social, and family histories, and medications.   ASSESSMENT: Positive pregnancy test  PLAN Prenatal care to be completed at: [facility] PNV to be sent to pharmacy. Advised if spotting worsens, bright red bleeding and/or severe pain/cramping to be seen at [facility].

## 2018-08-07 DIAGNOSIS — O0990 Supervision of high risk pregnancy, unspecified, unspecified trimester: Secondary | ICD-10-CM | POA: Insufficient documentation

## 2018-08-08 ENCOUNTER — Encounter: Payer: Self-pay | Admitting: Certified Nurse Midwife

## 2018-08-08 ENCOUNTER — Other Ambulatory Visit (HOSPITAL_COMMUNITY)
Admission: RE | Admit: 2018-08-08 | Discharge: 2018-08-08 | Disposition: A | Discharge status: Home / Self Care | Payer: Medicaid Other | Source: Ambulatory Visit | Attending: Certified Nurse Midwife | Admitting: Certified Nurse Midwife

## 2018-08-08 ENCOUNTER — Ambulatory Visit (INDEPENDENT_AMBULATORY_CARE_PROVIDER_SITE_OTHER): Payer: Medicaid Other | Admitting: Certified Nurse Midwife

## 2018-08-08 VITALS — BP 117/71 | HR 71 | Wt 136.2 lb

## 2018-08-08 DIAGNOSIS — O3440 Maternal care for other abnormalities of cervix, unspecified trimester: Secondary | ICD-10-CM | POA: Insufficient documentation

## 2018-08-08 DIAGNOSIS — Z8751 Personal history of pre-term labor: Secondary | ICD-10-CM

## 2018-08-08 DIAGNOSIS — Z3480 Encounter for supervision of other normal pregnancy, unspecified trimester: Secondary | ICD-10-CM

## 2018-08-08 DIAGNOSIS — Z3481 Encounter for supervision of other normal pregnancy, first trimester: Secondary | ICD-10-CM | POA: Diagnosis not present

## 2018-08-08 DIAGNOSIS — Z9889 Other specified postprocedural states: Secondary | ICD-10-CM

## 2018-08-08 DIAGNOSIS — Z3A09 9 weeks gestation of pregnancy: Secondary | ICD-10-CM | POA: Diagnosis not present

## 2018-08-08 DIAGNOSIS — O3441 Maternal care for other abnormalities of cervix, first trimester: Secondary | ICD-10-CM

## 2018-08-08 DIAGNOSIS — A5901 Trichomonal vulvovaginitis: Secondary | ICD-10-CM

## 2018-08-08 DIAGNOSIS — O23591 Infection of other part of genital tract in pregnancy, first trimester: Secondary | ICD-10-CM
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
## 2018-08-08 NOTE — Progress Notes (Signed)
Patient is in the office for initial ob visit with FOB, no complaints. Plans BTL

## 2018-08-08 NOTE — Patient Instructions (Signed)
First Trimester of Pregnancy The first trimester of pregnancy is from week 1 until the end of week 13 (months 1 through 3). During this time, your baby will begin to develop inside you. At 6-8 weeks, the eyes and face are formed, and the heartbeat can be seen on ultrasound. At the end of 12 weeks, all the baby's organs are formed. Prenatal care is all the medical care you receive before the birth of your baby. Make sure you get good prenatal care and follow all of your doctor's instructions. Follow these instructions at home: Medicines  Take over-the-counter and prescription medicines only as told by your doctor. Some medicines are safe and some medicines are not safe during pregnancy.  Take a prenatal vitamin that contains at least 600 micrograms (mcg) of folic acid.  If you have trouble pooping (constipation), take medicine that will make your stool soft (stool softener) if your doctor approves. Eating and drinking  Eat regular, healthy meals.  Your doctor will tell you the amount of weight gain that is right for you.  Avoid raw meat and uncooked cheese.  If you feel sick to your stomach (nauseous) or throw up (vomit): ? Eat 4 or 5 small meals a day instead of 3 large meals. ? Try eating a few soda crackers. ? Drink liquids between meals instead of during meals.  To prevent constipation: ? Eat foods that are high in fiber, like fresh fruits and vegetables, whole grains, and beans. ? Drink enough fluids to keep your pee (urine) clear or pale yellow. Activity  Exercise only as told by your doctor. Stop exercising if you have cramps or pain in your lower belly (abdomen) or low back.  Do not exercise if it is too hot, too humid, or if you are in a place of great height (high altitude).  Try to avoid standing for long periods of time. Move your legs often if you must stand in one place for a long time.  Avoid heavy lifting.  Wear low-heeled shoes. Sit and stand up straight.  You  can have sex unless your doctor tells you not to. Relieving pain and discomfort  Wear a good support bra if your breasts are sore.  Take warm water baths (sitz baths) to soothe pain or discomfort caused by hemorrhoids. Use hemorrhoid cream if your doctor says it is okay.  Rest with your legs raised if you have leg cramps or low back pain.  If you have puffy, bulging veins (varicose veins) in your legs: ? Wear support hose or compression stockings as told by your doctor. ? Raise (elevate) your feet for 15 minutes, 3-4 times a day. ? Limit salt in your food. Prenatal care  Schedule your prenatal visits by the twelfth week of pregnancy.  Write down your questions. Take them to your prenatal visits.  Keep all your prenatal visits as told by your doctor. This is important. Safety  Wear your seat belt at all times when driving.  Make a list of emergency phone numbers. The list should include numbers for family, friends, the hospital, and police and fire departments. General instructions  Ask your doctor for a referral to a local prenatal class. Begin classes no later than at the start of month 6 of your pregnancy.  Ask for help if you need counseling or if you need help with nutrition. Your doctor can give you advice or tell you where to go for help.  Do not use hot tubs, steam rooms, or   saunas.  Do not douche or use tampons or scented sanitary pads.  Do not cross your legs for long periods of time.  Avoid all herbs and alcohol. Avoid drugs that are not approved by your doctor.  Do not use any tobacco products, including cigarettes, chewing tobacco, and electronic cigarettes. If you need help quitting, ask your doctor. You may get counseling or other support to help you quit.  Avoid cat litter boxes and soil used by cats. These carry germs that can cause birth defects in the baby and can cause a loss of your baby (miscarriage) or stillbirth.  Visit your dentist. At home, brush  your teeth with a soft toothbrush. Be gentle when you floss. Contact a doctor if:  You are dizzy.  You have mild cramps or pressure in your lower belly.  You have a nagging pain in your belly area.  You continue to feel sick to your stomach, you throw up, or you have watery poop (diarrhea).  You have a bad smelling fluid coming from your vagina.  You have pain when you pee (urinate).  You have increased puffiness (swelling) in your face, hands, legs, or ankles. Get help right away if:  You have a fever.  You are leaking fluid from your vagina.  You have spotting or bleeding from your vagina.  You have very bad belly cramping or pain.  You gain or lose weight rapidly.  You throw up blood. It may look like coffee grounds.  You are around people who have German measles, fifth disease, or chickenpox.  You have a very bad headache.  You have shortness of breath.  You have any kind of trauma, such as from a fall or a car accident. Summary  The first trimester of pregnancy is from week 1 until the end of week 13 (months 1 through 3).  To take care of yourself and your unborn baby, you will need to eat healthy meals, take medicines only if your doctor tells you to do so, and do activities that are safe for you and your baby.  Keep all follow-up visits as told by your doctor. This is important as your doctor will have to ensure that your baby is healthy and growing well. This information is not intended to replace advice given to you by your health care provider. Make sure you discuss any questions you have with your health care provider. Document Released: 04/11/2008 Document Revised: 11/01/2016 Document Reviewed: 11/01/2016 Elsevier Interactive Patient Education  2017 Elsevier Inc.  

## 2018-08-10 LAB — CERVICOVAGINAL ANCILLARY ONLY
Bacterial vaginitis: POSITIVE — AB
Candida vaginitis: NEGATIVE
Chlamydia: NEGATIVE
Neisseria Gonorrhea: NEGATIVE
Trichomonas: POSITIVE — AB

## 2018-08-11 ENCOUNTER — Encounter: Payer: Self-pay | Admitting: Certified Nurse Midwife

## 2018-08-11 DIAGNOSIS — A5901 Trichomonal vulvovaginitis: Secondary | ICD-10-CM

## 2018-08-11 DIAGNOSIS — O23591 Infection of other part of genital tract in pregnancy, first trimester: Secondary | ICD-10-CM | POA: Insufficient documentation

## 2018-08-11 LAB — OBSTETRIC PANEL, INCLUDING HIV
Antibody Screen: NEGATIVE
Basophils Absolute: 0 10*3/uL (ref 0.0–0.2)
Basos: 1 %
EOS (ABSOLUTE): 0 10*3/uL (ref 0.0–0.4)
Eos: 0 %
HIV Screen 4th Generation wRfx: NONREACTIVE
Hematocrit: 37 % (ref 34.0–46.6)
Hemoglobin: 12 g/dL (ref 11.1–15.9)
Hepatitis B Surface Ag: NEGATIVE
Immature Grans (Abs): 0 10*3/uL (ref 0.0–0.1)
Immature Granulocytes: 0 %
Lymphocytes Absolute: 1.3 10*3/uL (ref 0.7–3.1)
Lymphs: 42 %
MCH: 26.5 pg — ABNORMAL LOW (ref 26.6–33.0)
MCHC: 32.4 g/dL (ref 31.5–35.7)
MCV: 82 fL (ref 79–97)
Monocytes Absolute: 0.5 10*3/uL (ref 0.1–0.9)
Monocytes: 16 %
Neutrophils Absolute: 1.3 10*3/uL — ABNORMAL LOW (ref 1.4–7.0)
Neutrophils: 41 %
Platelets: 232 10*3/uL (ref 150–450)
RBC: 4.53 x10E6/uL (ref 3.77–5.28)
RDW: 13.3 % (ref 12.3–15.4)
RPR Ser Ql: NONREACTIVE
Rh Factor: POSITIVE
Rubella Antibodies, IGG: <0.9 index — ABNORMAL LOW (ref >0.99)
WBC: 3.1 10*3/uL — ABNORMAL LOW (ref 3.4–10.8)

## 2018-08-11 LAB — HEMOGLOBINOPATHY EVALUATION
HGB C: 0 %
HGB S: 0 %
HGB VARIANT: 0 %
Hemoglobin A2 Quantitation: 2 % (ref 1.8–3.2)
Hemoglobin F Quantitation: 0 % (ref 0.0–2.0)
Hgb A: 98 % (ref 96.4–98.8)

## 2018-08-11 LAB — CULTURE, OB URINE

## 2018-08-11 LAB — URINE CULTURE, OB REFLEX: Organism ID, Bacteria: NO GROWTH

## 2018-08-11 MED ORDER — METRONIDAZOLE 500 MG PO TABS: 2000.0000 mg | ORAL_TABLET | Freq: Once | ORAL | 0 refills | Status: AC

## 2018-08-11 NOTE — Addendum Note (Signed)
Addended by: Sharyon Cable on: 08/11/2018 07:24 PM   Modules accepted: Orders

## 2018-08-15 ENCOUNTER — Encounter: Payer: Self-pay | Admitting: Certified Nurse Midwife

## 2018-08-25 ENCOUNTER — Inpatient Hospital Stay (HOSPITAL_COMMUNITY)
Admission: AD | Admit: 2018-08-25 | Discharge: 2018-08-25 | Disposition: A | Discharge status: Home / Self Care | Payer: Medicaid Other | Source: Ambulatory Visit | Attending: Obstetrics and Gynecology | Admitting: Obstetrics and Gynecology

## 2018-08-25 ENCOUNTER — Encounter (HOSPITAL_COMMUNITY): Payer: Self-pay | Admitting: *Deleted

## 2018-08-25 DIAGNOSIS — A5901 Trichomonal vulvovaginitis: Secondary | ICD-10-CM | POA: Diagnosis not present

## 2018-08-25 DIAGNOSIS — Z3A12 12 weeks gestation of pregnancy: Secondary | ICD-10-CM | POA: Diagnosis not present

## 2018-08-25 DIAGNOSIS — O23591 Infection of other part of genital tract in pregnancy, first trimester: Secondary | ICD-10-CM

## 2018-08-25 DIAGNOSIS — O98311 Other infections with a predominantly sexual mode of transmission complicating pregnancy, first trimester: Secondary | ICD-10-CM | POA: Diagnosis not present

## 2018-08-25 DIAGNOSIS — O209 Hemorrhage in early pregnancy, unspecified: Secondary | ICD-10-CM

## 2018-08-25 LAB — URINALYSIS, ROUTINE W REFLEX MICROSCOPIC
Bilirubin Urine: NEGATIVE
Glucose, UA: NEGATIVE mg/dL
Ketones, ur: NEGATIVE mg/dL
Leukocytes, UA: NEGATIVE
Nitrite: NEGATIVE
PROTEIN: NEGATIVE mg/dL
Specific Gravity, Urine: 1.018 (ref 1.005–1.030)
pH: 8 (ref 5.0–8.0)

## 2018-08-25 MED ORDER — METRONIDAZOLE 500 MG PO TABS
2000.0000 mg | ORAL_TABLET | Freq: Once | ORAL | Status: AC
  Administered 2018-08-25: 2000 mg via ORAL
  Filled 2018-08-25: qty 4

## 2018-08-25 NOTE — MAU Note (Signed)
Rachael Oliver is a 27 y.o. at [redacted]w[redacted]d here in MAU reporting: +vaginal bleeding. Not having to wear a pad. Dark in color. Onset of complaint: started yesterday Denies recent intercourse. Pain score:denies Vitals:   08/25/18 1023  BP: (!) 99/56  Pulse: 74  Resp: 18  Temp: 98.2 F (36.8 C)  SpO2: 98%   Lab orders placed from triage: ua .

## 2018-08-25 NOTE — Discharge Instructions (Signed)
Trichomoniasis °Trichomoniasis is an STI (sexually transmitted infection) that can affect both women and men. In women, the outer area of the female genitalia (vulva) and the vagina are affected. In men, the penis is mainly affected, but the prostate and other reproductive organs can also be involved. This condition can be treated with medicine. It often has no symptoms (is asymptomatic), especially in men. °What are the causes? °This condition is caused by an organism called Trichomonas vaginalis. Trichomoniasis most often spreads from person to person (is contagious) through sexual contact. °What increases the risk? °The following factors may make you more likely to develop this condition: °· Having unprotected sexual intercourse. °· Having sexual intercourse with a partner who has trichomoniasis. °· Having multiple sexual partners. °· Having had previous trichomoniasis infections or other STIs. ° °What are the signs or symptoms? °In women, symptoms of trichomoniasis include: °· Abnormal vaginal discharge that is clear, white, gray, or yellow-green and foamy and has an unusual "fishy" odor. °· Itching and irritation of the vagina and vulva. °· Burning or pain during urination or sexual intercourse. °· Genital redness and swelling. ° °In men, symptoms of trichomoniasis include: °· Penile discharge that may be foamy or contain pus. °· Pain in the penis. This may happen only when urinating. °· Itching or irritation inside the penis. °· Burning after urination or ejaculation. ° °How is this diagnosed? °In women, this condition may be found during a routine Pap test or physical exam. It may be found in men during a routine physical exam. Your health care provider may perform tests to help diagnose this infection, such as: °· Urine tests (men and women). °· The following in women: °? Testing the pH of the vagina. °? A vaginal swab test that checks for the Trichomonas vaginalis organism. °? Testing vaginal  secretions. ° °Your health care provider may test you for other STIs, including HIV (human immunodeficiency virus). °How is this treated? °This condition is treated with medicine taken by mouth (orally), such as metronidazole or tinidazole to fight the infection. Your sexual partner(s) may also need to be tested and treated. °· If you are a woman and you plan to become pregnant or think you may be pregnant, tell your health care provider right away. Some medicines that are used to treat the infection should not be taken during pregnancy. ° °Your health care provider may recommend over-the-counter medicines or creams to help relieve itching or irritation. You may be tested for infection again 3 months after treatment. °Follow these instructions at home: °· Take and use over-the-counter and prescription medicines, including creams, only as told by your health care provider. °· Do not have sexual intercourse until one week after you finish your medicine, or until your health care provider approves. Ask your health care provider when you may resume sexual intercourse. °· (Women) Do not douche or wear tampons while you have the infection. °· Discuss your infection with your sexual partner(s). Make sure that your partner gets tested and treated, if necessary. °· Keep all follow-up visits as told by your health care provider. This is important. °How is this prevented? °· Use condoms every time you have sex. Using condoms correctly and consistently can help protect against STIs. °· Avoid having multiple sexual partners. °· Talk with your sexual partner about any symptoms that either of you may have, as well as any history of STIs. °· Get tested for STIs and STDs (sexually transmitted diseases) before you have sex. Ask your partner   to do the same.  Do not have sexual contact if you have symptoms of trichomoniasis or another STI. Contact a health care provider if:  You still have symptoms after you finish your  medicine.  You develop pain in your abdomen.  You have pain when you urinate.  You have bleeding after sexual intercourse.  You develop a rash.  You feel nauseous or you vomit.  You plan to become pregnant or think you may be pregnant. Summary  Trichomoniasis is an STI (sexually transmitted infection) that can affect both women and men.  This condition often has no symptoms (is asymptomatic), especially in men.  You should not have sexual intercourse until one week after you finish your medicine, or until your health care provider approves. Ask your health care provider when you may resume sexual intercourse.  Discuss your infection with your sexual partner. Make sure that your partner gets tested and treated, if necessary. This information is not intended to replace advice given to you by your health care provider. Make sure you discuss any questions you have with your health care provider. Document Released: 04/19/2001 Document Revised: 09/16/2016 Document Reviewed: 09/16/2016 Elsevier Interactive Patient Education  2017 Elsevier Inc.     Vaginal Bleeding During Pregnancy, First Trimester A small amount of bleeding (spotting) from the vagina is relatively common in early pregnancy. It usually stops on its own. Various things may cause bleeding or spotting in early pregnancy. Some bleeding may be related to the pregnancy, and some may not. In most cases, the bleeding is normal and is not a problem. However, bleeding can also be a sign of something serious. Be sure to tell your health care provider about any vaginal bleeding right away. Some possible causes of vaginal bleeding during the first trimester include:  Infection or inflammation of the cervix.  Growths (polyps) on the cervix.  Miscarriage or threatened miscarriage.  Pregnancy tissue has developed outside of the uterus and in a fallopian tube (tubal pregnancy).  Tiny cysts have developed in the uterus instead of  pregnancy tissue (molar pregnancy).  Follow these instructions at home: Watch your condition for any changes. The following actions may help to lessen any discomfort you are feeling:  Follow your health care provider's instructions for limiting your activity. If your health care provider orders bed rest, you may need to stay in bed and only get up to use the bathroom. However, your health care provider may allow you to continue light activity.  If needed, make plans for someone to help with your regular activities and responsibilities while you are on bed rest.  Keep track of the number of pads you use each day, how often you change pads, and how soaked (saturated) they are. Write this down.  Do not use tampons. Do not douche.  Do not have sexual intercourse or orgasms until approved by your health care provider.  If you pass any tissue from your vagina, save the tissue so you can show it to your health care provider.  Only take over-the-counter or prescription medicines as directed by your health care provider.  Do not take aspirin because it can make you bleed.  Keep all follow-up appointments as directed by your health care provider.  Contact a health care provider if:  You have any vaginal bleeding during any part of your pregnancy.  You have cramps or labor pains.  You have a fever, not controlled by medicine. Get help right away if:  You have severe cramps  in your back or belly (abdomen).  You pass large clots or tissue from your vagina.  Your bleeding increases.  You feel light-headed or weak, or you have fainting episodes.  You have chills.  You are leaking fluid or have a gush of fluid from your vagina.  You pass out while having a bowel movement. This information is not intended to replace advice given to you by your health care provider. Make sure you discuss any questions you have with your health care provider. Document Released: 08/03/2005 Document Revised:  03/31/2016 Document Reviewed: 07/01/2013 Elsevier Interactive Patient Education  [employer].

## 2018-08-25 NOTE — MAU Provider Note (Signed)
Chief Complaint: Vaginal Bleeding   First Provider Initiated Contact with Patient 08/25/18 1043     SUBJECTIVE HPI: Rachael Oliver is a 27 y.o. [id number] at [redacted]w[redacted]d who presents to Maternity Admissions reporting vaginal bleeding. Reports bright red bleeding in underwear since last night. Bleeding has slowed down & darkened today. Denies abdominal pain. Last intercourse was a week ago.  Was treated for trichomonas in August & again at the beginning of this month. Has had intercourse with same partner since then. Partner is at the bedside & states that he has never been treated for trich because "he doesn't have it".    Past Medical History:  Diagnosis Date  . Asthma    as a child  . History of methicillin resistant staphylococcus aureus (MRSA)   . Infection    left elbow  . Preterm labor   . Syncope   . Trichomonosis   . Vaginal Pap smear, abnormal    OB History  Gravida Para Term Preterm AB Living  5 3 2 1 1 3   SAB TAB Ectopic Multiple Live Births  1       3    # Outcome Date GA Lbr Len/2nd Weight Sex Delivery Anes PTL Lv  5 Current           4 SAB 03/29/18 [redacted]w[redacted]d         3 Term 03/15/14 [redacted]w[redacted]d 02:20 / 00:15 3520 g M Vag-Spont EPI  LIV  2 Preterm 2014 [redacted]w[redacted]d    Vag-Spont   LIV  1 Term 2011 [redacted]w[redacted]d    Vag-Spont   LIV   Past Surgical History:  Procedure Laterality Date  . ELBOW SURGERY Left   . FOOT SURGERY    . GYNECOLOGIC CRYOSURGERY    . I&D EXTREMITY Left 12/03/2015   Procedure: IRRIGATION AND DEBRIDEMENT LEFT ELBOW;  Surgeon: Tarry Kos, MD;  Location: [facility] OR;  Service: Orthopedics;  Laterality: Left;  . uterine biopsy     Social History   Socioeconomic History  . Marital status: Single    Spouse name: Not on file  . Number of children: Not on file  . Years of education: Not on file  . Highest education level: Not on file  Occupational History  . Not on file  Social Needs  . Financial resource strain: Not on file  . Food insecurity:    Worry: Not on file   Inability: Not on file  . Transportation needs:    Medical: Not on file    Non-medical: Not on file  Tobacco Use  . Smoking status: Never Smoker  . Smokeless tobacco: Never Used  Substance and Sexual Activity  . Alcohol use: No    Alcohol/week: 0.0 standard drinks  . Drug use: No  . Sexual activity: Yes    Partners: Male    Birth control/protection: None  Lifestyle  . Physical activity:    Days per week: Not on file    Minutes per session: Not on file  . Stress: Not on file  Relationships  . Social connections:    Talks on phone: Not on file    Gets together: Not on file    Attends religious service: Not on file    Active member of club or organization: Not on file    Attends meetings of clubs or organizations: Not on file    Relationship status: Not on file  . Intimate partner violence:    Fear of current or ex partner: Not on file  Emotionally abused: Not on file    Physically abused: Not on file    Forced sexual activity: Not on file  Other Topics Concern  . Not on file  Social History Narrative  . Not on file   Family History  Problem Relation Age of Onset  . Diabetes Mother   . Alcohol abuse Neg Hx   . Arthritis Neg Hx   . Asthma Neg Hx   . Birth defects Neg Hx   . COPD Neg Hx   . Cancer Neg Hx   . Depression Neg Hx   . Drug abuse Neg Hx   . Early death Neg Hx   . Hearing loss Neg Hx   . Heart disease Neg Hx   . Hyperlipidemia Neg Hx   . Hypertension Neg Hx   . Kidney disease Neg Hx   . Learning disabilities Neg Hx   . Mental illness Neg Hx   . Mental retardation Neg Hx   . Miscarriages / Stillbirths Neg Hx   . Stroke Neg Hx   . Vision loss Neg Hx    No current facility-administered medications on file prior to encounter.    Current Outpatient Medications on File Prior to Encounter  Medication Sig Dispense Refill  . Prenatal Vit-Fe Phos-FA-Omega (VITAFOL GUMMIES) 3.33-0.333-34.8 MG CHEW Chew 3 each by mouth daily. (Patient not taking: Reported  on 08/08/2018) 90 tablet 11   No Known Allergies  I have reviewed patient's Past Medical Hx, Surgical Hx, Family Hx, Social Hx, medications and allergies.   Review of Systems  Constitutional: Negative.   Gastrointestinal: Negative.   Genitourinary: Positive for vaginal bleeding. Negative for dysuria and vaginal discharge.    OBJECTIVE Patient Vitals for the past 24 hrs:  BP Temp Temp src Pulse Resp SpO2 Weight  08/25/18 1132 114/72 - - 89 - - -  08/25/18 1023 (!) 99/56 98.2 F (36.8 C) Oral 74 18 98 % 62.2 kg   Constitutional: Well-developed, well-nourished female in no acute distress.  Cardiovascular: normal rate & rhythm, no murmur Respiratory: normal rate and effort. Lung sounds clear throughout GI: Abd soft, non-tender, Pos BS x 4. No guarding or rebound tenderness MS: Extremities nontender, no edema, normal ROM Neurologic: Alert and oriented x 4.  GU:     SPECULUM EXAM: NEFG, small amount of brown blood in vagina. Friable cervix.   BIMANUAL: No CMT. cervix closed   LAB RESULTS Results for orders placed or performed during the hospital encounter of 08/25/18 (from the past 24 hour(s))  Urinalysis, Routine w reflex microscopic     Status: Abnormal   Collection Time: 08/25/18 11:31 AM  Result Value Ref Range   Color, Urine YELLOW YELLOW   APPearance CLOUDY (A) CLEAR   Specific Gravity, Urine 1.018 1.005 - 1.030   pH 8.0 5.0 - 8.0   Glucose, UA NEGATIVE NEGATIVE mg/dL   Hgb urine dipstick LARGE (A) NEGATIVE   Bilirubin Urine NEGATIVE NEGATIVE   Ketones, ur NEGATIVE NEGATIVE mg/dL   Protein, ur NEGATIVE NEGATIVE mg/dL   Nitrite NEGATIVE NEGATIVE   Leukocytes, UA NEGATIVE NEGATIVE   RBC / HPF 21-50 0 - 5 RBC/hpf   WBC, UA 6-10 0 - 5 WBC/hpf   Bacteria, UA RARE (A) NONE SEEN   Squamous Epithelial / LPF >50 (H) 0 - 5   Mucus PRESENT     IMAGING No results found.  MAU COURSE Orders Placed This Encounter  Procedures  . Urinalysis, Routine w reflex microscopic  .  Discharge patient   Meds ordered this encounter  Medications  . metroNIDAZOLE (FLAGYL) tablet 2,000 mg    MDM FHT 165 by doppler Rh positive Pt has been re exposed to trichomonas as partner was never treated. Pt given flagyl 2 gm in MAU.  Partner refuses expedited partner treatment. Stressed importance of pt avoiding sexual contact with this partner until he gets treated.  GC/CT collected today ASSESSMENT 1. Trichomonal vaginitis during pregnancy, antepartum, first trimester   2. Vaginal bleeding in pregnancy, first trimester   3. [redacted] weeks gestation of pregnancy     PLAN Discharge home in stable condition. Bleeding precautions  Allergies as of 08/25/2018   No Known Allergies     Medication List    TAKE these medications   VITAFOL GUMMIES 3.33-0.333-34.8 MG Chew Chew 3 each by mouth daily.        Judeth Horn, NP 08/25/2018  5:44 PM

## 2018-08-27 LAB — GC/CHLAMYDIA PROBE AMP (~~LOC~~) NOT AT ARMC
Chlamydia: NEGATIVE
Neisseria Gonorrhea: NEGATIVE

## 2018-08-29 ENCOUNTER — Encounter: Payer: Self-pay | Admitting: Family Medicine

## 2018-09-05 ENCOUNTER — Encounter: Payer: Medicaid Other | Admitting: Certified Nurse Midwife

## 2018-09-10 ENCOUNTER — Encounter: Payer: Medicaid Other | Admitting: Advanced Practice Midwife

## 2018-09-10 ENCOUNTER — Other Ambulatory Visit (HOSPITAL_COMMUNITY)
Admission: RE | Admit: 2018-09-10 | Discharge: 2018-09-10 | Disposition: A | Discharge status: Home / Self Care | Payer: Medicaid Other | Source: Ambulatory Visit | Attending: Advanced Practice Midwife | Admitting: Advanced Practice Midwife

## 2018-09-10 ENCOUNTER — Ambulatory Visit (INDEPENDENT_AMBULATORY_CARE_PROVIDER_SITE_OTHER): Payer: Medicaid Other | Admitting: Advanced Practice Midwife

## 2018-09-10 VITALS — BP 108/69 | HR 77 | Wt 136.5 lb

## 2018-09-10 DIAGNOSIS — O26892 Other specified pregnancy related conditions, second trimester: Secondary | ICD-10-CM

## 2018-09-10 DIAGNOSIS — Z8619 Personal history of other infectious and parasitic diseases: Secondary | ICD-10-CM | POA: Diagnosis present

## 2018-09-10 DIAGNOSIS — O0990 Supervision of high risk pregnancy, unspecified, unspecified trimester: Secondary | ICD-10-CM

## 2018-09-10 DIAGNOSIS — R109 Unspecified abdominal pain: Secondary | ICD-10-CM

## 2018-09-10 NOTE — Patient Instructions (Signed)

## 2018-09-10 NOTE — Progress Notes (Signed)
Pt is here for ROB. [id number] [redacted]w[redacted]d. TOC today
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
## 2018-09-11 LAB — CERVICOVAGINAL ANCILLARY ONLY
Chlamydia: NEGATIVE
Neisseria Gonorrhea: NEGATIVE
Trichomonas: NEGATIVE

## 2018-09-12 LAB — CULTURE, OB URINE

## 2018-09-12 LAB — URINE CULTURE, OB REFLEX

## 2018-09-24 ENCOUNTER — Ambulatory Visit: Payer: Medicaid Other

## 2018-09-26 ENCOUNTER — Ambulatory Visit (INDEPENDENT_AMBULATORY_CARE_PROVIDER_SITE_OTHER): Payer: Medicaid Other

## 2018-09-26 DIAGNOSIS — O09212 Supervision of pregnancy with history of pre-term labor, second trimester: Secondary | ICD-10-CM

## 2018-09-26 DIAGNOSIS — Z8751 Personal history of pre-term labor: Secondary | ICD-10-CM

## 2018-09-26 MED ORDER — HYDROXYPROGESTERONE CAPROATE 275 MG/1.1ML ~~LOC~~ SOAJ
275.0000 mg | Freq: Once | SUBCUTANEOUS | Status: AC
  Administered 2018-09-26: 275 mg via SUBCUTANEOUS

## 2018-09-26 NOTE — Progress Notes (Signed)
Agree with A & P. 

## 2018-09-26 NOTE — Progress Notes (Signed)
Pt presents for 17p. Inj given in left arm. Pt tolerated well. Pt is scheduled for next week.

## 2018-10-01 ENCOUNTER — Ambulatory Visit: Payer: Medicaid Other

## 2018-10-03 ENCOUNTER — Ambulatory Visit: Payer: Medicaid Other

## 2018-10-08 ENCOUNTER — Ambulatory Visit (INDEPENDENT_AMBULATORY_CARE_PROVIDER_SITE_OTHER): Payer: Medicaid Other | Admitting: Obstetrics

## 2018-10-08 ENCOUNTER — Encounter: Payer: Self-pay | Admitting: Obstetrics

## 2018-10-08 VITALS — BP 117/77 | HR 99 | Wt 142.0 lb

## 2018-10-08 DIAGNOSIS — Z8751 Personal history of pre-term labor: Secondary | ICD-10-CM

## 2018-10-08 DIAGNOSIS — O3442 Maternal care for other abnormalities of cervix, second trimester: Secondary | ICD-10-CM

## 2018-10-08 DIAGNOSIS — Z9889 Other specified postprocedural states: Secondary | ICD-10-CM

## 2018-10-08 DIAGNOSIS — M549 Dorsalgia, unspecified: Secondary | ICD-10-CM

## 2018-10-08 DIAGNOSIS — O09212 Supervision of pregnancy with history of pre-term labor, second trimester: Secondary | ICD-10-CM

## 2018-10-08 DIAGNOSIS — O3440 Maternal care for other abnormalities of cervix, unspecified trimester: Secondary | ICD-10-CM

## 2018-10-08 DIAGNOSIS — O0990 Supervision of high risk pregnancy, unspecified, unspecified trimester: Secondary | ICD-10-CM

## 2018-10-08 DIAGNOSIS — O0992 Supervision of high risk pregnancy, unspecified, second trimester: Secondary | ICD-10-CM

## 2018-10-08 MED ORDER — HYDROXYPROGESTERONE CAPROATE 275 MG/1.1ML ~~LOC~~ SOAJ
275.0000 mg | SUBCUTANEOUS | Status: DC
  Administered 2018-10-08 – 2018-11-05 (×3): 275 mg via SUBCUTANEOUS

## 2018-10-08 MED ORDER — COMFORT FIT MATERNITY SUPP SM MISC: 1.0000 "application " | Freq: Every day | 0 refills | Status: DC
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
## 2018-10-16 ENCOUNTER — Ambulatory Visit (INDEPENDENT_AMBULATORY_CARE_PROVIDER_SITE_OTHER): Payer: Medicaid Other | Admitting: *Deleted

## 2018-10-16 VITALS — BP 132/74 | HR 73 | Wt 142.0 lb

## 2018-10-16 DIAGNOSIS — Z8751 Personal history of pre-term labor: Secondary | ICD-10-CM

## 2018-10-16 DIAGNOSIS — O09212 Supervision of pregnancy with history of pre-term labor, second trimester: Secondary | ICD-10-CM

## 2018-10-16 DIAGNOSIS — O0990 Supervision of high risk pregnancy, unspecified, unspecified trimester: Secondary | ICD-10-CM

## 2018-10-16 NOTE — Progress Notes (Signed)
I have reviewed the chart and agree with nursing staff's documentation of this patient's encounter.  Catalina AntiguaPeggy Lelend Heinecke, MD 10/16/2018 3:49 PM

## 2018-10-16 NOTE — Progress Notes (Signed)
Pt is in office for 17p injection.  Pt tolerated injection well. Pt states she has knot with itching in left arm from previous injection. Pt advised to monitor as knot should decrease and resolve. Injection today given in right arm.  Pt made aware to make office aware if any significant knot or severe pain/itching at injectio site.  BP 132/74   Pulse 73   Wt 142 lb (64.4 kg)   LMP 05/23/2018 (Approximate)   BMI 24.37 kg/m   Administrations This Visit    HYDROXYprogesterone Caproate SOAJ 275 mg    Admin Date 10/16/2018 Action Given Dose 275 mg Route Subcutaneous Administered By Lanney GinsFoster, Casee Knepp D, CMA

## 2018-10-23 ENCOUNTER — Ambulatory Visit: Payer: Medicaid Other

## 2018-10-23 VITALS — Wt 150.3 lb

## 2018-10-23 DIAGNOSIS — Z8751 Personal history of pre-term labor: Secondary | ICD-10-CM

## 2018-10-23 MED ORDER — HYDROXYPROGESTERONE CAPROATE 275 MG/1.1ML ~~LOC~~ SOAJ
275.0000 mg | Freq: Once | SUBCUTANEOUS | Status: AC
  Administered 2018-10-23: 275 mg via SUBCUTANEOUS

## 2018-10-23 NOTE — Progress Notes (Signed)
Pt is here for makena injection. Injection given in L arm, pt tolerated well. Pt instructed to return weekly for injections.

## 2018-10-24 ENCOUNTER — Telehealth: Payer: Self-pay

## 2018-10-24 NOTE — Telephone Encounter (Signed)
Pt is calling with c/o a "painful lump" in her arm from her Makena injection yesterday. I advised pt to take benadryl and tylenol and put ice on arm. Also advised patient that we would need to talk to the provider about possible switching her to IM injections as she states she has had these "lumps" before from the 17p injections. Pt declines switching to the IM injection. I advised patient she would need to discuss this with the provider at her next visit, pt verbalized understanding.

## 2018-10-29 ENCOUNTER — Ambulatory Visit: Payer: Medicaid Other

## 2018-11-05 ENCOUNTER — Ambulatory Visit (INDEPENDENT_AMBULATORY_CARE_PROVIDER_SITE_OTHER): Payer: Medicaid Other | Admitting: Obstetrics

## 2018-11-05 VITALS — BP 104/69 | HR 87 | Wt 147.0 lb

## 2018-11-05 DIAGNOSIS — Z8751 Personal history of pre-term labor: Secondary | ICD-10-CM

## 2018-11-05 DIAGNOSIS — O09212 Supervision of pregnancy with history of pre-term labor, second trimester: Secondary | ICD-10-CM

## 2018-11-05 DIAGNOSIS — O0990 Supervision of high risk pregnancy, unspecified, unspecified trimester: Secondary | ICD-10-CM

## 2018-11-05 DIAGNOSIS — O0992 Supervision of high risk pregnancy, unspecified, second trimester: Secondary | ICD-10-CM

## 2018-11-06 ENCOUNTER — Encounter: Payer: Self-pay | Admitting: Obstetrics
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
## 2018-11-07 NOTE — L&D Delivery Note (Signed)
Patient: Rachael Oliver MRN: [id number]  GBS status: Unknown, IAP given: PCN   Patient is a 28 y.o. now [id number] s/p NSVD at [redacted]w[redacted]d, who was admitted for PTL. AROM 0h 16m prior to delivery with clear fluid.    Delivery Note At 2:28 AM a viable female was delivered via Vaginal, Spontaneous (Presentation: LOA).  APGAR: 9, 9; weight 5 lb 7.1 oz (2469 g).   Placenta status: spontaneous, intact.  Cord:  3 vessel with the following complications: none.    Anesthesia:  Epidural  Episiotomy: None Lacerations: None Suture Repair: None  Est. Blood Loss (mL): 20  Head delivered LOA. No nuchal cord present. Shoulder and body delivered in usual fashion. Infant with spontaneous cry, placed on mother's abdomen, dried and bulb suctioned. Cord clamped x 2 after 1-minute delay, and cut by family member. Cord blood drawn. Placenta delivered spontaneously with gentle cord traction. Fundus firm with massage and Pitocin. Perineum inspected and found to have no lacerations.   Mom to postpartum.  Baby to Couplet care / Skin to Skin.  De Hollingshead 02/07/2019, 4:09 AM

## 2018-11-08 ENCOUNTER — Inpatient Hospital Stay (HOSPITAL_COMMUNITY)
Admission: AD | Admit: 2018-11-08 | Discharge: 2018-11-08 | Disposition: A | Discharge status: Home / Self Care | Payer: Medicaid Other | Attending: Obstetrics & Gynecology | Admitting: Obstetrics & Gynecology

## 2018-11-08 ENCOUNTER — Inpatient Hospital Stay (HOSPITAL_BASED_OUTPATIENT_CLINIC_OR_DEPARTMENT_OTHER): Payer: Medicaid Other

## 2018-11-08 ENCOUNTER — Encounter (HOSPITAL_COMMUNITY): Payer: Self-pay

## 2018-11-08 ENCOUNTER — Other Ambulatory Visit: Payer: Self-pay

## 2018-11-08 DIAGNOSIS — O26892 Other specified pregnancy related conditions, second trimester: Secondary | ICD-10-CM | POA: Diagnosis not present

## 2018-11-08 DIAGNOSIS — R109 Unspecified abdominal pain: Secondary | ICD-10-CM | POA: Diagnosis present

## 2018-11-08 DIAGNOSIS — Z8614 Personal history of Methicillin resistant Staphylococcus aureus infection: Secondary | ICD-10-CM | POA: Diagnosis not present

## 2018-11-08 DIAGNOSIS — Z3A22 22 weeks gestation of pregnancy: Secondary | ICD-10-CM

## 2018-11-08 LAB — URINALYSIS, ROUTINE W REFLEX MICROSCOPIC
Bilirubin Urine: NEGATIVE
Glucose, UA: NEGATIVE mg/dL
Hgb urine dipstick: NEGATIVE
KETONES UR: NEGATIVE mg/dL
LEUKOCYTES UA: NEGATIVE
NITRITE: NEGATIVE
PH: 7 (ref 5.0–8.0)
PROTEIN: NEGATIVE mg/dL
Specific Gravity, Urine: 1.015 (ref 1.005–1.030)

## 2018-11-08 NOTE — MAU Note (Signed)
Having some pain in lower abd, like near the panty line, started this morning.  Has been kind of short of breath, just walking around.  Feels like baby is down there.

## 2018-11-08 NOTE — MAU Provider Note (Signed)
Chief Complaint: Abdominal Pain   First Provider Initiated Contact with Patient 11/08/18 1856     SUBJECTIVE HPI: Rachael Oliver is a 28 y.o. [id number] at [redacted]w[redacted]d who presents to [facility] Admissions reporting abdominal pain. Symptoms began this morning. States happened before during this pregnancy & she was told by the doctor that it was normal pregnancy pain.  Today's symptoms are in the suprapubic area and have been constant. Denies fever, n/v/d, constipation, vaginal discharge, vaginal bleeding, dysuria, hematuria, or frequency. No recent intercourse.   Location: abdomen Quality: cramping Severity: 4/10 on pain scale Duration: 1 day Timing: constant Modifying factors: none Associated signs and symptoms: none  Past Medical History:  Diagnosis Date  . Asthma    as a child  . History of methicillin resistant staphylococcus aureus (MRSA)   . Infection    left elbow  . Preterm labor   . Syncope   . Trichomonosis   . Vaginal Pap smear, abnormal    OB History  Gravida Para Term Preterm AB Living  5 3 2 1 1 3   SAB TAB Ectopic Multiple Live Births  1       3    # Outcome Date GA Lbr Len/2nd Weight Sex Delivery Anes PTL Lv  5 Current           4 SAB 03/29/18 [redacted]w[redacted]d         3 Term 03/15/14 [redacted]w[redacted]d 02:20 / 00:15 3520 g M Vag-Spont EPI  LIV  2 Preterm 2014 [redacted]w[redacted]d    Vag-Spont   LIV  1 Term 2011 [redacted]w[redacted]d    Vag-Spont   LIV   Past Surgical History:  Procedure Laterality Date  . ELBOW SURGERY Left   . FOOT SURGERY    . GYNECOLOGIC CRYOSURGERY    . I&D EXTREMITY Left 12/03/2015   Procedure: IRRIGATION AND DEBRIDEMENT LEFT ELBOW;  Surgeon: Tarry Kos, MD;  Location: [facility] OR;  Service: Orthopedics;  Laterality: Left;  . uterine biopsy     Social History   Socioeconomic History  . Marital status: Single    Spouse name: Not on file  . Number of children: Not on file  . Years of education: Not on file  . Highest education level: Not on file  Occupational History  . Not on file  Social  Needs  . Financial resource strain: Not on file  . Food insecurity:    Worry: Not on file    Inability: Not on file  . Transportation needs:    Medical: Not on file    Non-medical: Not on file  Tobacco Use  . Smoking status: Never Smoker  . Smokeless tobacco: Never Used  Substance and Sexual Activity  . Alcohol use: No    Alcohol/week: 0.0 standard drinks  . Drug use: No  . Sexual activity: Yes    Partners: Male    Birth control/protection: None  Lifestyle  . Physical activity:    Days per week: Not on file    Minutes per session: Not on file  . Stress: Not on file  Relationships  . Social connections:    Talks on phone: Not on file    Gets together: Not on file    Attends religious service: Not on file    Active member of club or organization: Not on file    Attends meetings of clubs or organizations: Not on file    Relationship status: Not on file  . Intimate partner violence:    Fear of current  or ex partner: Not on file    Emotionally abused: Not on file    Physically abused: Not on file    Forced sexual activity: Not on file  Other Topics Concern  . Not on file  Social History Narrative  . Not on file   Family History  Problem Relation Age of Onset  . Diabetes Mother   . Alcohol abuse Neg Hx   . Arthritis Neg Hx   . Asthma Neg Hx   . Birth defects Neg Hx   . COPD Neg Hx   . Cancer Neg Hx   . Depression Neg Hx   . Drug abuse Neg Hx   . Early death Neg Hx   . Hearing loss Neg Hx   . Heart disease Neg Hx   . Hyperlipidemia Neg Hx   . Hypertension Neg Hx   . Kidney disease Neg Hx   . Learning disabilities Neg Hx   . Mental illness Neg Hx   . Mental retardation Neg Hx   . Miscarriages / Stillbirths Neg Hx   . Stroke Neg Hx   . Vision loss Neg Hx    Current Facility-Administered Medications on File Prior to Encounter  Medication Dose Route Frequency Provider Last Rate Last Dose  . HYDROXYprogesterone Caproate SOAJ 275 mg  275 mg Subcutaneous Weekly  Brock BadHarper, Charles A, MD   275 mg at 11/05/18 1106   Current Outpatient Medications on File Prior to Encounter  Medication Sig Dispense Refill  . Elastic Bandages & Supports (COMFORT FIT MATERNITY SUPP SM) MISC 1 application by Does not apply route daily. Wear as directed daily. 1 each 0  . Prenatal Vit-Fe Phos-FA-Omega (VITAFOL GUMMIES) 3.33-0.333-34.8 MG CHEW Chew 3 each by mouth daily. 90 tablet 11   No Known Allergies  I have reviewed patient's Past Medical Hx, Surgical Hx, Family Hx, Social Hx, medications and allergies.   Review of Systems  Constitutional: Negative.   Gastrointestinal: Positive for abdominal pain. Negative for constipation, diarrhea, nausea and vomiting.  Genitourinary: Negative.     OBJECTIVE Patient Vitals for the past 24 hrs:  BP Temp Temp src Pulse Resp SpO2 Weight  11/08/18 2034 (!) 105/52 - - 69 - - -  11/08/18 2033 - - - - 16 - -  11/08/18 1833 (!) 105/59 98.6 F (37 C) Oral 97 18 100 % 66.5 kg   Constitutional: Well-developed, well-nourished female in no acute distress.  Cardiovascular: normal rate & rhythm, no murmur Respiratory: normal rate and effort. Lung sounds clear throughout GI: Abd soft, non-tender, Pos BS x 4. No guarding or rebound tenderness MS: Extremities nontender, no edema, normal ROM Neurologic: Alert and oriented x 4.  GU:   Cervix closed/50%, firm, posterior   LAB RESULTS Results for orders placed or performed during the hospital encounter of 11/08/18 (from the past 24 hour(s))  Urinalysis, Routine w reflex microscopic     Status: None   Collection Time: 11/08/18  7:04 PM  Result Value Ref Range   Color, Urine YELLOW YELLOW   APPearance CLEAR CLEAR   Specific Gravity, Urine 1.015 1.005 - 1.030   pH 7.0 5.0 - 8.0   Glucose, UA NEGATIVE NEGATIVE mg/dL   Hgb urine dipstick NEGATIVE NEGATIVE   Bilirubin Urine NEGATIVE NEGATIVE   Ketones, ur NEGATIVE NEGATIVE mg/dL   Protein, ur NEGATIVE NEGATIVE mg/dL   Nitrite NEGATIVE  NEGATIVE   Leukocytes, UA NEGATIVE NEGATIVE    IMAGING No results found.  MAU COURSE Orders Placed This  Encounter  Procedures  . [place] MFM OB LIMITED  . Urinalysis, Routine w reflex microscopic  . Discharge patient   No orders of the defined types were placed in this encounter.   MDM Cervix closed, ?50%. Ultrasound ordered --- cervical length 3.23 cm U/a negative for infection Pt reassured. Will d/c home with PTL precautions  ASSESSMENT 1. [redacted] weeks gestation of pregnancy   2. Abdominal pain during pregnancy in second trimester     PLAN Discharge home in stable condition. Preterm precautions  Allergies as of 11/08/2018   No Known Allergies     Medication List    TAKE these medications   COMFORT FIT MATERNITY SUPP SM Misc 1 application by Does not apply route daily. Wear as directed daily.   VITAFOL GUMMIES 3.33-0.333-34.8 MG Chew Chew 3 each by mouth daily.        Judeth Horn, NP 11/08/2018  8:43 PM

## 2018-11-08 NOTE — Discharge Instructions (Signed)
Abdominal Pain During Pregnancy ° °Abdominal pain is common during pregnancy, and has many possible causes. Some causes are more serious than others, and sometimes the cause is not known. Abdominal pain can be a sign that labor is starting. It can also be caused by normal growth and stretching of muscles and ligaments during pregnancy. Always tell your health care provider if you have any abdominal pain. °Follow these instructions at home: °· Do not have sex or put anything in your vagina until your pain goes away completely. °· Get plenty of rest until your pain improves. °· Drink enough fluid to keep your urine pale yellow. °· Take over-the-counter and prescription medicines only as told by your health care provider. °· Keep all follow-up visits as told by your health care provider. This is important. °Contact a health care provider if: °· Your pain continues or gets worse after resting. °· You have lower abdominal pain that: °? Comes and goes at regular intervals. °? Spreads to your back. °? Is similar to menstrual cramps. °· You have pain or burning when you urinate. °Get help right away if: °· You have a fever or chills. °· You have vaginal bleeding. °· You are leaking fluid from your vagina. °· You are passing tissue from your vagina. °· You have vomiting or diarrhea that lasts for more than 24 hours. °· Your baby is moving less than usual. °· You feel very weak or faint. °· You have shortness of breath. °· You develop severe pain in your upper abdomen. °Summary °· Abdominal pain is common during pregnancy, and has many possible causes. °· If you experience abdominal pain during pregnancy, tell your health care provider right away. °· Follow your health care provider's home care instructions and keep all follow-up visits as directed. °This information is not intended to replace advice given to you by your health care provider. Make sure you discuss any questions you have with your health care  provider. °Document Released: 10/24/2005 Document Revised: 01/26/2017 Document Reviewed: 01/26/2017 °Elsevier Interactive Patient Education © 2019 [employer] ° °

## 2018-11-17 ENCOUNTER — Encounter (HOSPITAL_COMMUNITY): Payer: Self-pay

## 2018-11-17 ENCOUNTER — Other Ambulatory Visit: Payer: Self-pay

## 2018-11-17 ENCOUNTER — Inpatient Hospital Stay (HOSPITAL_COMMUNITY)
Admission: AD | Admit: 2018-11-17 | Discharge: 2018-11-17 | Disposition: A | Discharge status: Home / Self Care | Payer: Medicaid Other | Source: Ambulatory Visit | Attending: Family Medicine | Admitting: Family Medicine

## 2018-11-17 DIAGNOSIS — Z8614 Personal history of Methicillin resistant Staphylococcus aureus infection: Secondary | ICD-10-CM | POA: Insufficient documentation

## 2018-11-17 DIAGNOSIS — N760 Acute vaginitis: Secondary | ICD-10-CM | POA: Diagnosis not present

## 2018-11-17 DIAGNOSIS — O23592 Infection of other part of genital tract in pregnancy, second trimester: Secondary | ICD-10-CM | POA: Diagnosis not present

## 2018-11-17 DIAGNOSIS — Z3A24 24 weeks gestation of pregnancy: Secondary | ICD-10-CM | POA: Insufficient documentation

## 2018-11-17 DIAGNOSIS — O26892 Other specified pregnancy related conditions, second trimester: Secondary | ICD-10-CM

## 2018-11-17 DIAGNOSIS — N898 Other specified noninflammatory disorders of vagina: Secondary | ICD-10-CM | POA: Diagnosis present

## 2018-11-17 DIAGNOSIS — B9689 Other specified bacterial agents as the cause of diseases classified elsewhere: Secondary | ICD-10-CM | POA: Insufficient documentation

## 2018-11-17 DIAGNOSIS — Z3689 Encounter for other specified antenatal screening: Secondary | ICD-10-CM

## 2018-11-17 DIAGNOSIS — O98812 Other maternal infectious and parasitic diseases complicating pregnancy, second trimester: Secondary | ICD-10-CM | POA: Insufficient documentation

## 2018-11-17 DIAGNOSIS — B373 Candidiasis of vulva and vagina: Secondary | ICD-10-CM | POA: Insufficient documentation

## 2018-11-17 DIAGNOSIS — Z3A26 26 weeks gestation of pregnancy: Secondary | ICD-10-CM

## 2018-11-17 DIAGNOSIS — B3731 Acute candidiasis of vulva and vagina: Secondary | ICD-10-CM

## 2018-11-17 LAB — WET PREP, GENITAL
SPERM: NONE SEEN
Trich, Wet Prep: NONE SEEN

## 2018-11-17 MED ORDER — NYSTATIN 100000 UNIT/GM EX CREA: 1.0000 "application " | TOPICAL_CREAM | Freq: Two times a day (BID) | CUTANEOUS | 1 refills | Status: DC

## 2018-11-17 MED ORDER — TERCONAZOLE 0.4 % VA CREA: 1.0000 | TOPICAL_CREAM | Freq: Every day | VAGINAL | 0 refills | Status: DC

## 2018-11-17 MED ORDER — METRONIDAZOLE 500 MG PO TABS: 500.0000 mg | ORAL_TABLET | Freq: Two times a day (BID) | ORAL | 0 refills | Status: DC

## 2018-11-17 NOTE — MAU Note (Signed)
Vaginal swelling since yesterday. Some yellow d/c and very irritated -esp when walking.

## 2018-11-17 NOTE — Discharge Instructions (Signed)
Vaginal Yeast infection, Adult    Vaginal yeast infection is a condition that causes vaginal discharge as well as soreness, swelling, and redness (inflammation) of the vagina. This is a common condition. Some women get this infection frequently.  What are the causes?  This condition is caused by a change in the normal balance of the yeast (candida) and bacteria that live in the vagina. This change causes an overgrowth of yeast, which causes the inflammation.  What increases the risk?  The condition is more likely to develop in women who:   Take antibiotic medicines.   Have diabetes.   Take birth control pills.   Are pregnant.   Douche often.   Have a weak body defense system (immune system).   Have been taking steroid medicines for a long time.   Frequently wear tight clothing.  What are the signs or symptoms?  Symptoms of this condition include:   White, thick, creamy vaginal discharge.   Swelling, itching, redness, and irritation of the vagina. The lips of the vagina (vulva) may be affected as well.   Pain or a burning feeling while urinating.   Pain during sex.  How is this diagnosed?  This condition is diagnosed based on:   Your medical history.   A physical exam.   A pelvic exam. Your health care provider will examine a sample of your vaginal discharge under a microscope. Your health care provider may send this sample for testing to confirm the diagnosis.  How is this treated?  This condition is treated with medicine. Medicines may be over-the-counter or prescription. You may be told to use one or more of the following:   Medicine that is taken by mouth (orally).   Medicine that is applied as a cream (topically).   Medicine that is inserted directly into the vagina (suppository).  Follow these instructions at home:    Lifestyle   Do not have sex until your health care provider approves. Tell your sex partner that you have a yeast infection. That person should go to his or her health care  provider and ask if they should also be treated.   Do not wear tight clothes, such as pantyhose or tight pants.   Wear breathable cotton underwear.  General instructions   Take or apply over-the-counter and prescription medicines only as told by your health care provider.   Eat more yogurt. This may help to keep your yeast infection from returning.   Do not use tampons until your health care provider approves.   Try taking a sitz bath to help with discomfort. This is a warm water bath that is taken while you are sitting down. The water should only come up to your hips and should cover your buttocks. Do this 3-4 times per day or as told by your health care provider.   Do not douche.   If you have diabetes, keep your blood sugar levels under control.   Keep all follow-up visits as told by your health care provider. This is important.  Contact a health care provider if:   You have a fever.   Your symptoms go away and then return.   Your symptoms do not get better with treatment.   Your symptoms get worse.   You have new symptoms.   You develop blisters in or around your vagina.   You have blood coming from your vagina and it is not your menstrual period.   You develop pain in your abdomen.  Summary     Vaginal yeast infection is a condition that causes discharge as well as soreness, swelling, and redness (inflammation) of the vagina.   This condition is treated with medicine. Medicines may be over-the-counter or prescription.   Take or apply over-the-counter and prescription medicines only as told by your health care provider.   Do not douche. Do not have sex or use tampons until your health care provider approves.   Contact a health care provider if your symptoms do not get better with treatment or your symptoms go away and then return.  This information is not intended to replace advice given to you by your health care provider. Make sure you discuss any questions you have with your health care  provider.  Document Released: 08/03/2005 Document Revised: 03/12/2018 Document Reviewed: 03/12/2018  Elsevier Interactive Patient Education  2019 [employer] Inc[employer]

## 2018-11-17 NOTE — MAU Provider Note (Addendum)
History     CSN: [id number]  Arrival date and time: 11/17/18 1952   First Provider Initiated Contact with Patient 11/17/18 2131      Chief Complaint  Patient presents with  . Vaginal Pain  . Vaginal Discharge   HPI Rachael Oliver is a 28 y.o. [id number] at [redacted]w[redacted]d who presents to MAU with chief complaints of vulvar swelling and pain as well as thick vaginal discharge. These are new problems, onset yesterday. She believes she has a yeast infection. She denies vaginal bleeding, leaking of fluid, decreased fetal movement, fever, falls, or recent illness.    OB History    Gravida  5   Para  3   Term  2   Preterm  1   AB  1   Living  3     SAB  1   TAB      Ectopic      Multiple      Live Births  3           Past Medical History:  Diagnosis Date  . Asthma    as a child  . History of methicillin resistant staphylococcus aureus (MRSA)   . Infection    left elbow  . Preterm labor   . Syncope   . Trichomonosis   . Vaginal Pap smear, abnormal     Past Surgical History:  Procedure Laterality Date  . ELBOW SURGERY Left   . FOOT SURGERY    . GYNECOLOGIC CRYOSURGERY    . I&D EXTREMITY Left 12/03/2015   Procedure: IRRIGATION AND DEBRIDEMENT LEFT ELBOW;  Surgeon: Tarry Kos, MD;  Location: [facility] OR;  Service: Orthopedics;  Laterality: Left;  . uterine biopsy      Family History  Problem Relation Age of Onset  . Diabetes Mother   . Alcohol abuse Neg Hx   . Arthritis Neg Hx   . Asthma Neg Hx   . Birth defects Neg Hx   . COPD Neg Hx   . Cancer Neg Hx   . Depression Neg Hx   . Drug abuse Neg Hx   . Early death Neg Hx   . Hearing loss Neg Hx   . Heart disease Neg Hx   . Hyperlipidemia Neg Hx   . Hypertension Neg Hx   . Kidney disease Neg Hx   . Learning disabilities Neg Hx   . Mental illness Neg Hx   . Mental retardation Neg Hx   . Miscarriages / Stillbirths Neg Hx   . Stroke Neg Hx   . Vision loss Neg Hx     Social History   Tobacco Use  .  Smoking status: Never Smoker  . Smokeless tobacco: Never Used  Substance Use Topics  . Alcohol use: No    Alcohol/week: 0.0 standard drinks  . Drug use: No    Allergies: No Known Allergies  Facility-Administered Medications Prior to Admission  Medication Dose Route Frequency Provider Last Rate Last Dose  . HYDROXYprogesterone Caproate SOAJ 275 mg  275 mg Subcutaneous Weekly Brock Bad, MD   275 mg at 11/05/18 1106   Medications Prior to Admission  Medication Sig Dispense Refill Last Dose  . Elastic Bandages & Supports (COMFORT FIT MATERNITY SUPP SM) MISC 1 application by Does not apply route daily. Wear as directed daily. 1 each 0 Taking  . Prenatal Vit-Fe Phos-FA-Omega (VITAFOL GUMMIES) 3.33-0.333-34.8 MG CHEW Chew 3 each by mouth daily. 90 tablet 11 Taking    Review of  Systems  Constitutional: Negative for chills, fatigue and fever.  Respiratory: Negative for shortness of breath.   Gastrointestinal: Negative for abdominal pain.  Genitourinary: Positive for vaginal discharge and vaginal pain.  Musculoskeletal: Negative for back pain.  Neurological: Negative for headaches.  All other systems reviewed and are negative.  Physical Exam   Blood pressure 108/67, pulse 85, temperature 98.1 F (36.7 C), resp. rate 16, height 5\' 4"  (1.626 m), weight 67.6 kg, last menstrual period 05/23/2018, unknown if currently breastfeeding.  Physical Exam  Nursing note and vitals reviewed. Constitutional: She is oriented to person, place, and time. She appears well-developed and well-nourished.  Cardiovascular: Normal rate.  Respiratory: Effort normal.  GI: She exhibits no distension. There is no abdominal tenderness. There is no rebound and no guarding.  Gravid  Genitourinary:    Vaginal discharge present.     Genitourinary Comments: Thick white vaginal discharge removed with fox swabs x 3   Neurological: She is alert and oriented to person, place, and time.  Skin: Skin is warm and  dry.  Psychiatric: She has a normal mood and affect. Her behavior is normal. Judgment and thought content normal.    MAU Course/MDM  Procedures: sterile speculum exam  --reactive fetal tracing: baseline 150, moderate variability, positive accels, no decels --Toco: quiet  Patient Vitals for the past 24 hrs:  BP Temp Pulse Resp Height Weight  11/17/18 2243 123/72 - 79 16 - -  11/17/18 2001 108/67 - 85 - - -  11/17/18 2000 - 98.1 F (36.7 C) - 16 5\' 4"  (1.626 m) 67.6 kg    Results for orders placed or performed during the hospital encounter of 11/17/18 (from the past 24 hour(s))  Wet prep, genital     Status: Abnormal   Collection Time: 11/17/18  9:53 PM  Result Value Ref Range   Yeast Wet Prep HPF POC PRESENT (A) NONE SEEN   Trich, Wet Prep NONE SEEN NONE SEEN   Clue Cells Wet Prep HPF POC PRESENT (A) NONE SEEN   WBC, Wet Prep HPF POC MODERATE (A) NONE SEEN   Sperm NONE SEEN     Meds ordered this encounter  Medications  . metroNIDAZOLE (FLAGYL) 500 MG tablet    Sig: Take 1 tablet (500 mg total) by mouth 2 (two) times daily.    Dispense:  14 tablet    Refill:  0    Order Specific Question:   Supervising Provider    Answer:   Reva Bores [[id number]]  . terconazole (TERAZOL 7) 0.4 % vaginal cream    Sig: Place 1 applicator vaginally at bedtime.    Dispense:  45 g    Refill:  0    Order Specific Question:   Supervising Provider    Answer:   Reva Bores [[id number]]  . nystatin cream (MYCOSTATIN)    Sig: Apply 1 application topically 2 (two) times daily.    Dispense:  30 g    Refill:  1    Order Specific Question:   Supervising Provider    Answer:   Samara Snide    Assessment and Plan  --28 y.o. [phone] at [redacted]w[redacted]d  --Reactive tracing --Yeast infection, bacterial vaginosis, rx to pharmacy --Discharge home in stable condition  F/U: [facility] 12/03/2018  Calvert Cantor, CNM 11/17/2018, 10:54 PM

## 2018-11-19 LAB — GC/CHLAMYDIA PROBE AMP (~~LOC~~) NOT AT ARMC
CHLAMYDIA, DNA PROBE: NEGATIVE
NEISSERIA GONORRHEA: NEGATIVE

## 2018-12-03 ENCOUNTER — Encounter: Payer: Self-pay | Admitting: Obstetrics

## 2018-12-03 ENCOUNTER — Ambulatory Visit (INDEPENDENT_AMBULATORY_CARE_PROVIDER_SITE_OTHER): Payer: Medicaid Other | Admitting: Obstetrics

## 2018-12-03 VITALS — BP 109/68 | HR 82 | Wt 153.0 lb

## 2018-12-03 DIAGNOSIS — O0990 Supervision of high risk pregnancy, unspecified, unspecified trimester: Secondary | ICD-10-CM

## 2018-12-03 DIAGNOSIS — O0992 Supervision of high risk pregnancy, unspecified, second trimester: Secondary | ICD-10-CM

## 2018-12-03 DIAGNOSIS — Z8751 Personal history of pre-term labor: Secondary | ICD-10-CM
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
## 2018-12-05 ENCOUNTER — Ambulatory Visit (HOSPITAL_COMMUNITY)
Admission: RE | Admit: 2018-12-05 | Discharge: 2018-12-05 | Disposition: A | Discharge status: Home / Self Care | Payer: Medicaid Other | Source: Ambulatory Visit | Attending: Obstetrics | Admitting: Obstetrics

## 2018-12-05 DIAGNOSIS — Z3A26 26 weeks gestation of pregnancy: Secondary | ICD-10-CM

## 2018-12-05 DIAGNOSIS — O09213 Supervision of pregnancy with history of pre-term labor, third trimester: Secondary | ICD-10-CM | POA: Diagnosis not present

## 2018-12-05 DIAGNOSIS — O0990 Supervision of high risk pregnancy, unspecified, unspecified trimester: Secondary | ICD-10-CM | POA: Diagnosis present

## 2018-12-05 DIAGNOSIS — Z363 Encounter for antenatal screening for malformations: Secondary | ICD-10-CM | POA: Diagnosis not present

## 2018-12-17 ENCOUNTER — Encounter (HOSPITAL_COMMUNITY): Payer: Self-pay

## 2018-12-17 ENCOUNTER — Inpatient Hospital Stay (HOSPITAL_COMMUNITY)
Admission: AD | Admit: 2018-12-17 | Discharge: 2018-12-17 | Disposition: A | Discharge status: Home / Self Care | Payer: Medicaid Other | Attending: Obstetrics and Gynecology | Admitting: Obstetrics and Gynecology

## 2018-12-17 DIAGNOSIS — O09219 Supervision of pregnancy with history of pre-term labor, unspecified trimester: Secondary | ICD-10-CM

## 2018-12-17 DIAGNOSIS — Z8614 Personal history of Methicillin resistant Staphylococcus aureus infection: Secondary | ICD-10-CM | POA: Insufficient documentation

## 2018-12-17 DIAGNOSIS — O3680X0 Pregnancy with inconclusive fetal viability, not applicable or unspecified: Secondary | ICD-10-CM

## 2018-12-17 DIAGNOSIS — O23591 Infection of other part of genital tract in pregnancy, first trimester: Secondary | ICD-10-CM

## 2018-12-17 DIAGNOSIS — Z3A28 28 weeks gestation of pregnancy: Secondary | ICD-10-CM | POA: Diagnosis not present

## 2018-12-17 DIAGNOSIS — O26893 Other specified pregnancy related conditions, third trimester: Secondary | ICD-10-CM | POA: Insufficient documentation

## 2018-12-17 DIAGNOSIS — O23593 Infection of other part of genital tract in pregnancy, third trimester: Secondary | ICD-10-CM

## 2018-12-17 DIAGNOSIS — R102 Pelvic and perineal pain: Secondary | ICD-10-CM | POA: Diagnosis present

## 2018-12-17 DIAGNOSIS — O0990 Supervision of high risk pregnancy, unspecified, unspecified trimester: Secondary | ICD-10-CM

## 2018-12-17 DIAGNOSIS — A5901 Trichomonal vulvovaginitis: Secondary | ICD-10-CM

## 2018-12-17 DIAGNOSIS — N9089 Other specified noninflammatory disorders of vulva and perineum: Secondary | ICD-10-CM

## 2018-12-17 DIAGNOSIS — Z9889 Other specified postprocedural states: Secondary | ICD-10-CM

## 2018-12-17 LAB — URINALYSIS, ROUTINE W REFLEX MICROSCOPIC
Bilirubin Urine: NEGATIVE
Glucose, UA: NEGATIVE mg/dL
KETONES UR: NEGATIVE mg/dL
NITRITE: NEGATIVE
PH: 7.5 (ref 5.0–8.0)
Protein, ur: NEGATIVE mg/dL
Specific Gravity, Urine: 1.01 (ref 1.005–1.030)

## 2018-12-17 LAB — URINALYSIS, MICROSCOPIC (REFLEX)

## 2018-12-17 LAB — WET PREP, GENITAL
Clue Cells Wet Prep HPF POC: NONE SEEN
Sperm: NONE SEEN
TRICH WET PREP: NONE SEEN
Yeast Wet Prep HPF POC: NONE SEEN

## 2018-12-17 MED ORDER — HYDROCORTISONE 1 % EX CREA: TOPICAL_CREAM | CUTANEOUS | 0 refills | Status: DC

## 2018-12-17 NOTE — MAU Provider Note (Signed)
History     CSN: [id number]  Arrival date and time: 12/17/18 [id number]   First Provider Initiated Contact with Patient 12/17/18 0507      Chief Complaint  Patient presents with  . Vaginal Pain   Rachael Oliver is a 28 y.o. [phone] at 9374w3d who presents for Vaginal Pain. She reports that she has had vaginal swelling that started yesterday early in the day.  She reprots that the discomfort made it impossible for her to sleep tonight.  She has tried to use the Nystatin cream this morning, but felt it made her symptoms worse so she discontinued use.  Patient admits that she did not complete her treatments for BV and yeast that was diagnosed on 11/17/2018. She denies vaginal discharge, odor, or itching, but reports burning and irritation.  No recent IC. She denies issues with urination, constipation or diarrhea. She endorses +FM and denies LoF, Ctx, and VB.     OB History    Gravida  5   Para  3   Term  2   Preterm  1   AB  1   Living  3     SAB  1   TAB      Ectopic      Multiple      Live Births  3           Past Medical History:  Diagnosis Date  . Asthma    as a child  . History of methicillin resistant staphylococcus aureus (MRSA)   . Infection    left elbow  . Preterm labor   . Syncope   . Trichomonosis   . Vaginal Pap smear, abnormal     Past Surgical History:  Procedure Laterality Date  . ELBOW SURGERY Left   . FOOT SURGERY    . GYNECOLOGIC CRYOSURGERY    . I&D EXTREMITY Left 12/03/2015   Procedure: IRRIGATION AND DEBRIDEMENT LEFT ELBOW;  Surgeon: Tarry KosNaiping M Xu, MD;  Location: [facility] OR;  Service: Orthopedics;  Laterality: Left;  . uterine biopsy      Family History  Problem Relation Age of Onset  . Diabetes Mother   . Alcohol abuse Neg Hx   . Arthritis Neg Hx   . Asthma Neg Hx   . Birth defects Neg Hx   . COPD Neg Hx   . Cancer Neg Hx   . Depression Neg Hx   . Drug abuse Neg Hx   . Early death Neg Hx   . Hearing loss Neg Hx   . Heart disease  Neg Hx   . Hyperlipidemia Neg Hx   . Hypertension Neg Hx   . Kidney disease Neg Hx   . Learning disabilities Neg Hx   . Mental illness Neg Hx   . Mental retardation Neg Hx   . Miscarriages / Stillbirths Neg Hx   . Stroke Neg Hx   . Vision loss Neg Hx     Social History   Tobacco Use  . Smoking status: Never Smoker  . Smokeless tobacco: Never Used  Substance Use Topics  . Alcohol use: No    Alcohol/week: 0.0 standard drinks  . Drug use: No    Allergies: No Known Allergies  Facility-Administered Medications Prior to Admission  Medication Dose Route Frequency Provider Last Rate Last Dose  . HYDROXYprogesterone Caproate SOAJ 275 mg  275 mg Subcutaneous Weekly Brock BadHarper, Charles A, MD   275 mg at 11/05/18 1106   Medications Prior to Admission  Medication  Sig Dispense Refill Last Dose  . metroNIDAZOLE (FLAGYL) 500 MG tablet Take 1 tablet (500 mg total) by mouth 2 (two) times daily. 14 tablet 0 Past Month at Unknown time  . Prenatal Vit-Fe Phos-FA-Omega (VITAFOL GUMMIES) 3.33-0.333-34.8 MG CHEW Chew 3 each by mouth daily. 90 tablet 11 Past Month at Unknown time  . terconazole (TERAZOL 7) 0.4 % vaginal cream Place 1 applicator vaginally at bedtime. 45 g 0 Past Month at Unknown time  . Elastic Bandages & Supports (COMFORT FIT MATERNITY SUPP SM) MISC 1 application by Does not apply route daily. Wear as directed daily. 1 each 0 Taking  . nystatin cream (MYCOSTATIN) Apply 1 application topically 2 (two) times daily. 30 g 1 More than a month at Unknown time    Review of Systems  Constitutional: Negative for chills and fever.  Gastrointestinal: Negative for constipation, diarrhea, nausea and vomiting.  Genitourinary: Positive for dysuria and vaginal discharge. Negative for vaginal bleeding.   Physical Exam   Blood pressure 112/62, pulse 90, temperature 98.2 F (36.8 C), temperature source Oral, resp. rate 18, height 5\' 4"  (1.626 m), weight 72.1 kg, last menstrual period 05/23/2018, SpO2  100 %, unknown if currently breastfeeding.  Physical Exam  Constitutional: She is oriented to person, place, and time. She appears well-developed and well-nourished. No distress.  HENT:  Head: Normocephalic and atraumatic.  Eyes: Conjunctivae are normal.  Neck: Normal range of motion.  Cardiovascular: Normal rate.  Respiratory: Effort normal.  GI: Soft.  Genitourinary: There is tenderness on the right labia. There is tenderness on the left labia. Cervix exhibits no motion tenderness, no discharge and no friability.    Vaginal discharge present.     No vaginal bleeding.  No bleeding in the vagina.    Genitourinary Comments:  Speculum Exam: -Vulva: Edema of labia minora bilaterally.  Folliculitis noted bilaterally on labia majora-NT.  Excoriation noted on left labia.  -Vaginal Vault: Inflamed mucosa. Moderate amt thin white discharge noted -wet prep collected -Cervix:Pink, no lesions, cysts, or polyps.  Appears closed. No active bleeding  from os -Bimanual Exam: Deferred for patient comfort   Musculoskeletal: Normal range of motion.        General: No edema.  Neurological: She is alert and oriented to person, place, and time.  Skin: Skin is warm and dry.  Psychiatric: She has a normal mood and affect. Her behavior is normal.    Fetal Assessment 145 bpm, Mod Var, -Decels, +Accels Toco: None graphed  MAU Course   Results for orders placed or performed during the hospital encounter of 12/17/18 (from the past 24 hour(s))  Urinalysis, Routine w reflex microscopic     Status: Abnormal   Collection Time: 12/17/18  4:18 AM  Result Value Ref Range   Color, Urine YELLOW YELLOW   APPearance CLEAR CLEAR   Specific Gravity, Urine 1.010 1.005 - 1.030   pH 7.5 5.0 - 8.0   Glucose, UA NEGATIVE NEGATIVE mg/dL   Hgb urine dipstick TRACE (A) NEGATIVE   Bilirubin Urine NEGATIVE NEGATIVE   Ketones, ur NEGATIVE NEGATIVE mg/dL   Protein, ur NEGATIVE NEGATIVE mg/dL   Nitrite NEGATIVE NEGATIVE    Leukocytes, UA LARGE (A) NEGATIVE  Urinalysis, Microscopic (reflex)     Status: Abnormal   Collection Time: 12/17/18  4:18 AM  Result Value Ref Range   RBC / HPF 0-5 0 - 5 RBC/hpf   WBC, UA 0-5 0 - 5 WBC/hpf   Bacteria, UA RARE (A) NONE SEEN   Squamous  Epithelial / LPF 0-5 0 - 5    MDM Pelvic Exam with Wet Prep  Labs: UA   Assessment and Plan  28 year old [id number] at 28.3wks Cat I FT Vulva Edema  -Exam findings discussed -Wet prep pending -NST reactive -Will await results  Follow Up (6:23 AM)  -Wet prep returns with insignificant findings -Results discussed with patient -Discussed application of cold compresses or sitz baths to assist with swelling. -Instructed to discontinue usage of nystatin cream -Rx for hydrocortisone cream 1%, instructed to place on external area. -No questions or concerns. -Encouraged to call or return to MAU if symptoms worsen or with the onset of new symptoms. -Preterm labor precautions -Discharged to home in stable condition  Cherre Robins MSN, CNM 12/17/2018, 5:08 AM

## 2018-12-17 NOTE — MAU Note (Signed)
Pt here with c/o stinging pain in vaginal area; pain upon urination; "feels like a cut down there." Denies any bleeding or leaking of fluid. Reports good fetal movement.

## 2018-12-18 ENCOUNTER — Other Ambulatory Visit: Payer: Medicaid Other

## 2018-12-18 ENCOUNTER — Ambulatory Visit (INDEPENDENT_AMBULATORY_CARE_PROVIDER_SITE_OTHER): Payer: Medicaid Other | Admitting: Obstetrics

## 2018-12-18 ENCOUNTER — Encounter: Payer: Self-pay | Admitting: Obstetrics

## 2018-12-18 VITALS — Wt 156.0 lb

## 2018-12-18 DIAGNOSIS — O0993 Supervision of high risk pregnancy, unspecified, third trimester: Secondary | ICD-10-CM

## 2018-12-18 DIAGNOSIS — Z8751 Personal history of pre-term labor: Secondary | ICD-10-CM

## 2018-12-18 DIAGNOSIS — Z3A28 28 weeks gestation of pregnancy: Secondary | ICD-10-CM

## 2018-12-18 DIAGNOSIS — O0990 Supervision of high risk pregnancy, unspecified, unspecified trimester: Secondary | ICD-10-CM

## 2018-12-18 DIAGNOSIS — O09213 Supervision of pregnancy with history of pre-term labor, third trimester: Secondary | ICD-10-CM

## 2018-12-18 DIAGNOSIS — O09219 Supervision of pregnancy with history of pre-term labor, unspecified trimester: Secondary | ICD-10-CM
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
## 2018-12-20 LAB — CBC
Hematocrit: 28.5 % — ABNORMAL LOW (ref 34.0–46.6)
Hemoglobin: 9.4 g/dL — ABNORMAL LOW (ref 11.1–15.9)
MCH: 26.7 pg (ref 26.6–33.0)
MCHC: 33 g/dL (ref 31.5–35.7)
MCV: 81 fL (ref 79–97)
Platelets: 214 10*3/uL (ref 150–450)
RBC: 3.52 x10E6/uL — ABNORMAL LOW (ref 3.77–5.28)
RDW: 12.4 % (ref 11.7–15.4)
WBC: 5 10*3/uL (ref 3.4–10.8)

## 2018-12-20 LAB — GLUCOSE TOLERANCE, 2 HOURS W/ 1HR
GLUCOSE, 1 HOUR: 93 mg/dL (ref 65–179)
GLUCOSE, FASTING: 75 mg/dL (ref 65–91)
Glucose, 2 hour: 85 mg/dL (ref 65–152)

## 2018-12-20 LAB — RPR: RPR Ser Ql: NONREACTIVE

## 2018-12-20 LAB — HIV ANTIBODY (ROUTINE TESTING W REFLEX): HIV Screen 4th Generation wRfx: NONREACTIVE

## 2018-12-29 ENCOUNTER — Inpatient Hospital Stay (HOSPITAL_COMMUNITY)
Admission: AD | Admit: 2018-12-29 | Discharge: 2018-12-30 | Disposition: A | Discharge status: Home / Self Care | Payer: Medicaid Other | Source: Ambulatory Visit | Attending: Obstetrics & Gynecology | Admitting: Obstetrics & Gynecology

## 2018-12-29 ENCOUNTER — Other Ambulatory Visit: Payer: Self-pay

## 2018-12-29 ENCOUNTER — Encounter (HOSPITAL_COMMUNITY): Payer: Self-pay

## 2018-12-29 DIAGNOSIS — O26893 Other specified pregnancy related conditions, third trimester: Secondary | ICD-10-CM | POA: Diagnosis not present

## 2018-12-29 DIAGNOSIS — Z8614 Personal history of Methicillin resistant Staphylococcus aureus infection: Secondary | ICD-10-CM | POA: Insufficient documentation

## 2018-12-29 DIAGNOSIS — O23593 Infection of other part of genital tract in pregnancy, third trimester: Secondary | ICD-10-CM | POA: Diagnosis not present

## 2018-12-29 DIAGNOSIS — R300 Dysuria: Secondary | ICD-10-CM

## 2018-12-29 DIAGNOSIS — R102 Pelvic and perineal pain: Secondary | ICD-10-CM

## 2018-12-29 DIAGNOSIS — Z3A30 30 weeks gestation of pregnancy: Secondary | ICD-10-CM

## 2018-12-29 DIAGNOSIS — N898 Other specified noninflammatory disorders of vagina: Secondary | ICD-10-CM

## 2018-12-29 NOTE — MAU Note (Addendum)
Pain and pelvic pressure all day. Vaginal swelling. Stings when I pee. Denies vag bleeding or LOF. When sitting I can deal with it but pain worse when I get up to walk

## 2018-12-30 DIAGNOSIS — Z3A30 30 weeks gestation of pregnancy: Secondary | ICD-10-CM

## 2018-12-30 DIAGNOSIS — R102 Pelvic and perineal pain: Secondary | ICD-10-CM

## 2018-12-30 DIAGNOSIS — R300 Dysuria: Secondary | ICD-10-CM

## 2018-12-30 DIAGNOSIS — O26893 Other specified pregnancy related conditions, third trimester: Secondary | ICD-10-CM

## 2018-12-30 DIAGNOSIS — N898 Other specified noninflammatory disorders of vagina: Secondary | ICD-10-CM

## 2018-12-30 LAB — URINALYSIS, ROUTINE W REFLEX MICROSCOPIC
Bilirubin Urine: NEGATIVE
Glucose, UA: NEGATIVE mg/dL
KETONES UR: NEGATIVE mg/dL
NITRITE: NEGATIVE
PROTEIN: NEGATIVE mg/dL
Specific Gravity, Urine: 1.025 (ref 1.005–1.030)
pH: 6 (ref 5.0–8.0)

## 2018-12-30 LAB — URINALYSIS, MICROSCOPIC (REFLEX)

## 2018-12-30 LAB — WET PREP, GENITAL
Clue Cells Wet Prep HPF POC: NONE SEEN
Sperm: NONE SEEN
Trich, Wet Prep: NONE SEEN
Yeast Wet Prep HPF POC: NONE SEEN

## 2018-12-30 MED ORDER — VALACYCLOVIR HCL 500 MG PO TABS: 500.0000 mg | ORAL_TABLET | Freq: Two times a day (BID) | ORAL | 0 refills | Status: AC

## 2018-12-30 MED ORDER — VALACYCLOVIR HCL 1 G PO TABS: 1000.0000 mg | ORAL_TABLET | Freq: Two times a day (BID) | ORAL | 0 refills | Status: AC

## 2018-12-30 MED ORDER — VALACYCLOVIR HCL 500 MG PO TABS
1000.0000 mg | ORAL_TABLET | Freq: Once | ORAL | Status: AC
  Administered 2018-12-30: 1000 mg via ORAL
  Filled 2018-12-30: qty 2

## 2018-12-30 MED ORDER — LIDOCAINE HCL URETHRAL/MUCOSAL 2 % EX GEL
1.0000 "application " | Freq: Once | CUTANEOUS | Status: AC
  Administered 2018-12-30: 1 via TOPICAL
  Filled 2018-12-30: qty 5

## 2018-12-30 NOTE — MAU Provider Note (Signed)
History     CSN: [id number]  Arrival date and time: 12/29/18 2319   First Provider Initiated Contact with Patient 12/30/18 0012      No chief complaint on file.  HPI   Ms.Rachael Oliver is a 28 y.o. female [phone] @ [redacted]w[redacted]d here with dysuria, and pelvic pressure. The pressure started 2 days ago. The patient has several MAU visits due to pelvic pain/pressure. Last baby was born preterm at 35 weeks. No vaginal bleeding. The pelvic pressure comes and goes throughout the day. She is requesting that I look at her vagina today because she feels like something is not right down there. Says it hurts to urinate and she has been holding her urine because of the discomfort.  No vaginal bleeding.   OB History    Gravida  5   Para  3   Term  2   Preterm  1   AB  1   Living  3     SAB  1   TAB      Ectopic      Multiple      Live Births  3           Past Medical History:  Diagnosis Date  . Asthma    as a child  . History of methicillin resistant staphylococcus aureus (MRSA)   . Infection    left elbow  . Preterm labor   . Syncope   . Syncope    once monthly with and without preg. Started approx in 2016  . Trichomonosis   . Vaginal Pap smear, abnormal     Past Surgical History:  Procedure Laterality Date  . ELBOW SURGERY Left   . FOOT SURGERY    . GYNECOLOGIC CRYOSURGERY    . I&D EXTREMITY Left 12/03/2015   Procedure: IRRIGATION AND DEBRIDEMENT LEFT ELBOW;  Surgeon: Tarry Kos, MD;  Location: [facility] OR;  Service: Orthopedics;  Laterality: Left;  . uterine biopsy      Family History  Problem Relation Age of Onset  . Diabetes Mother   . Alcohol abuse Neg Hx   . Arthritis Neg Hx   . Asthma Neg Hx   . Birth defects Neg Hx   . COPD Neg Hx   . Cancer Neg Hx   . Depression Neg Hx   . Drug abuse Neg Hx   . Early death Neg Hx   . Hearing loss Neg Hx   . Heart disease Neg Hx   . Hyperlipidemia Neg Hx   . Hypertension Neg Hx   . Kidney disease Neg Hx   .  Learning disabilities Neg Hx   . Mental illness Neg Hx   . Mental retardation Neg Hx   . Miscarriages / Stillbirths Neg Hx   . Stroke Neg Hx   . Vision loss Neg Hx     Social History   Tobacco Use  . Smoking status: Never Smoker  . Smokeless tobacco: Never Used  Substance Use Topics  . Alcohol use: No    Alcohol/week: 0.0 standard drinks  . Drug use: No    Allergies: No Known Allergies  Facility-Administered Medications Prior to Admission  Medication Dose Route Frequency Provider Last Rate Last Dose  . HYDROXYprogesterone Caproate SOAJ 275 mg  275 mg Subcutaneous Weekly Brock Bad, MD   275 mg at 11/05/18 1106   Medications Prior to Admission  Medication Sig Dispense Refill Last Dose  . Elastic Bandages & Supports (COMFORT FIT MATERNITY  SUPP SM) MISC 1 application by Does not apply route daily. Wear as directed daily. 1 each 0 Taking  . hydrocortisone cream 1 % Apply to affected area 2 times daily 15 g 0 Taking  . Prenatal Vit-Fe Phos-FA-Omega (VITAFOL GUMMIES) 3.33-0.333-34.8 MG CHEW Chew 3 each by mouth daily. 90 tablet 11 Taking   Results for orders placed or performed during the hospital encounter of 12/29/18 (from the past 48 hour(s))  Urinalysis, Routine w reflex microscopic     Status: Abnormal   Collection Time: 12/29/18 11:38 PM  Result Value Ref Range   Color, Urine YELLOW YELLOW   APPearance CLEAR CLEAR   Specific Gravity, Urine 1.025 1.005 - 1.030   pH 6.0 5.0 - 8.0   Glucose, UA NEGATIVE NEGATIVE mg/dL   Hgb urine dipstick SMALL (A) NEGATIVE   Bilirubin Urine NEGATIVE NEGATIVE   Ketones, ur NEGATIVE NEGATIVE mg/dL   Protein, ur NEGATIVE NEGATIVE mg/dL   Nitrite NEGATIVE NEGATIVE   Leukocytes,Ua MODERATE (A) NEGATIVE    Comment: Performed at [facility], [address] [place], [place] [address]  Urinalysis, Microscopic (reflex)     Status: Abnormal   Collection Time: 12/29/18 11:38 PM  Result Value Ref Range   RBC / HPF 0-5 0 - 5 RBC/hpf    WBC, UA 6-10 0 - 5 WBC/hpf   Bacteria, UA FEW (A) NONE SEEN   Squamous Epithelial / LPF 0-5 0 - 5    Comment: Performed at [facility], [address] [place], [place] [address]  Wet prep, genital     Status: Abnormal   Collection Time: 12/30/18 12:25 AM  Result Value Ref Range   Yeast Wet Prep HPF POC NONE SEEN NONE SEEN   Trich, Wet Prep NONE SEEN NONE SEEN   Clue Cells Wet Prep HPF POC NONE SEEN NONE SEEN   WBC, Wet Prep HPF POC MODERATE (A) NONE SEEN    Comment: MANY BACTERIA SEEN   Sperm NONE SEEN     Comment: Performed at [facility], [address] [place], [place] [address]   Review of Systems  Genitourinary: Positive for pelvic pain and vaginal pain. Negative for vaginal bleeding and vaginal discharge.   Physical Exam   Blood pressure 113/75, pulse 99, temperature 98.4 F (36.9 C), resp. rate 18, height 5\' 4"  (1.626 m), weight 71.7 kg, last menstrual period 05/23/2018, unknown if currently breastfeeding.  Physical Exam  Constitutional: She is oriented to person, place, and time. She appears well-developed and well-nourished. No distress.  GI: Soft. She exhibits no distension and no mass. There is no abdominal tenderness. There is no rebound and no guarding.  Genitourinary:       Vaginal erythema and tenderness present.  There is erythema and tenderness in the vagina.    Genitourinary Comments: Dilation: Fingertip Effacement (%): Thick Presentation: Vertex Exam by:: Venia CarbonJennifer Rasch NP Multiple herpetic like lesions on vulva, favoring right labia majora. Very tender to touch. Vulvar cellulitis noted.    Neurological: She is alert and oriented to person, place, and time.  Skin: Skin is warm. She is not diaphoretic.  Psychiatric: Her behavior is normal.   Fetal Tracing:  Baseline: 135 bpm Variability: moderate  Accelerations: 10x10 Decelerations: variable  Toco: none  MAU Course  Procedures None  MDM Urine culture pending HSV culture pending Wet  prep and GC given Patient given peri bottle for urinating, patient able to urinate. Valtrex 1 gram given PO and lidocaine jelly   Assessment and Plan  A:  1. Vaginal lesion   2. Vaginal irritation   3. Dysuria   4. Pelvic pressure in female   5. 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 Patient informed. Culture pending Rx/ Vatrex for treatment and RX for suppression. Instructed to start suppression d/t history of preterm delivery   Rasch, Harolyn Rutherford, NP 12/30/2018 8:42 PM

## 2018-12-31 LAB — CULTURE, OB URINE
Culture: 70000 — AB
Special Requests: NORMAL

## 2018-12-31 LAB — GC/CHLAMYDIA PROBE AMP (~~LOC~~) NOT AT ARMC
Chlamydia: NEGATIVE
Neisseria Gonorrhea: NEGATIVE

## 2019-01-02 ENCOUNTER — Ambulatory Visit (INDEPENDENT_AMBULATORY_CARE_PROVIDER_SITE_OTHER): Payer: Medicaid Other | Admitting: Obstetrics and Gynecology

## 2019-01-02 ENCOUNTER — Encounter: Payer: Self-pay | Admitting: Obstetrics and Gynecology

## 2019-01-02 VITALS — BP 121/79 | HR 103 | Wt 156.0 lb

## 2019-01-02 DIAGNOSIS — O0993 Supervision of high risk pregnancy, unspecified, third trimester: Secondary | ICD-10-CM

## 2019-01-02 DIAGNOSIS — O09219 Supervision of pregnancy with history of pre-term labor, unspecified trimester: Secondary | ICD-10-CM

## 2019-01-02 DIAGNOSIS — O09213 Supervision of pregnancy with history of pre-term labor, third trimester: Secondary | ICD-10-CM

## 2019-01-02 DIAGNOSIS — O0990 Supervision of high risk pregnancy, unspecified, unspecified trimester: Secondary | ICD-10-CM

## 2019-01-02 DIAGNOSIS — Z3A30 30 weeks gestation of pregnancy: Secondary | ICD-10-CM

## 2019-01-02 NOTE — Patient Instructions (Signed)
Third Trimester of Pregnancy The third trimester is from week 28 through week 40 (months 7 through 9). The third trimester is a time when the unborn baby (fetus) is growing rapidly. At the end of the ninth month, the fetus is about 20 inches in length and weighs 6-10 pounds. Body changes during your third trimester Your body will continue to go through many changes during pregnancy. The changes vary from woman to woman. During the third trimester:  Your weight will continue to increase. You can expect to gain 25-35 pounds (11-16 kg) by the end of the pregnancy.  You may begin to get stretch marks on your hips, abdomen, and breasts.  You may urinate more often because the fetus is moving lower into your pelvis and pressing on your bladder.  You may develop or continue to have heartburn. This is caused by increased hormones that slow down muscles in the digestive tract.  You may develop or continue to have constipation because increased hormones slow digestion and cause the muscles that push waste through your intestines to relax.  You may develop hemorrhoids. These are swollen veins (varicose veins) in the rectum that can itch or be painful.  You may develop swollen, bulging veins (varicose veins) in your legs.  You may have increased body aches in the pelvis, back, or thighs. This is due to weight gain and increased hormones that are relaxing your joints.  You may have changes in your hair. These can include thickening of your hair, rapid growth, and changes in texture. Some women also have hair loss during or after pregnancy, or hair that feels dry or thin. Your hair will most likely return to normal after your baby is born.  Your breasts will continue to grow and they will continue to become tender. A yellow fluid (colostrum) may leak from your breasts. This is the first milk you are producing for your baby.  Your belly button may stick out.  You may notice more swelling in your hands,  face, or ankles.  You may have increased tingling or numbness in your hands, arms, and legs. The skin on your belly may also feel numb.  You may feel short of breath because of your expanding uterus.  You may have more problems sleeping. This can be caused by the size of your belly, increased need to urinate, and an increase in your body's metabolism.  You may notice the fetus "dropping," or moving lower in your abdomen (lightening).  You may have increased vaginal discharge.  You may notice your joints feel loose and you may have pain around your pelvic bone. What to expect at prenatal visits You will have prenatal exams every 2 weeks until week 36. Then you will have weekly prenatal exams. During a routine prenatal visit:  You will be weighed to make sure you and the baby are growing normally.  Your blood pressure will be taken.  Your abdomen will be measured to track your baby's growth.  The fetal heartbeat will be listened to.  Any test results from the previous visit will be discussed.  You may have a cervical check near your due date to see if your cervix has softened or thinned (effaced).  You will be tested for Group B streptococcus. This happens between 35 and 37 weeks. Your health care provider may ask you:  What your birth plan is.  How you are feeling.  If you are feeling the baby move.  If you have had any abnormal   symptoms, such as leaking fluid, bleeding, severe headaches, or abdominal cramping.  If you are using any tobacco products, including cigarettes, chewing tobacco, and electronic cigarettes.  If you have any questions. Other tests or screenings that may be performed during your third trimester include:  Blood tests that check for low iron levels (anemia).  Fetal testing to check the health, activity level, and growth of the fetus. Testing is done if you have certain medical conditions or if there are problems during the pregnancy.  Nonstress test  (NST). This test checks the health of your baby to make sure there are no signs of problems, such as the baby not getting enough oxygen. During this test, a belt is placed around your belly. The baby is made to move, and its heart rate is monitored during movement. What is false labor? False labor is a condition in which you feel small, irregular tightenings of the muscles in the womb (contractions) that usually go away with rest, changing position, or drinking water. These are called Braxton Hicks contractions. Contractions may last for hours, days, or even weeks before true labor sets in. If contractions come at regular intervals, become more frequent, increase in intensity, or become painful, you should see your health care provider. What are the signs of labor?  Abdominal cramps.  Regular contractions that start at 10 minutes apart and become stronger and more frequent with time.  Contractions that start on the top of the uterus and spread down to the lower abdomen and back.  Increased pelvic pressure and dull back pain.  A watery or bloody mucus discharge that comes from the vagina.  Leaking of amniotic fluid. This is also known as your "water breaking." It could be a slow trickle or a gush. Let your health care provider know if it has a color or strange odor. If you have any of these signs, call your health care provider right away, even if it is before your due date. Follow these instructions at home: Medicines  Follow your health care provider's instructions regarding medicine use. Specific medicines may be either safe or unsafe to take during pregnancy.  Take a prenatal vitamin that contains at least 600 micrograms (mcg) of folic acid.  If you develop constipation, try taking a stool softener if your health care provider approves. Eating and drinking   Eat a balanced diet that includes fresh fruits and vegetables, whole grains, good sources of protein such as meat, eggs, or tofu,  and low-fat dairy. Your health care provider will help you determine the amount of weight gain that is right for you.  Avoid raw meat and uncooked cheese. These carry germs that can cause birth defects in the baby.  If you have low calcium intake from food, talk to your health care provider about whether you should take a daily calcium supplement.  Eat four or five small meals rather than three large meals a day.  Limit foods that are high in fat and processed sugars, such as fried and sweet foods.  To prevent constipation: ? Drink enough fluid to keep your urine clear or pale yellow. ? Eat foods that are high in fiber, such as fresh fruits and vegetables, whole grains, and beans. Activity  Exercise only as directed by your health care provider. Most women can continue their usual exercise routine during pregnancy. Try to exercise for 30 minutes at least 5 days a week. Stop exercising if you experience uterine contractions.  Avoid heavy lifting.  Do   not exercise in extreme heat or humidity, or at high altitudes.  Wear low-heel, comfortable shoes.  Practice good posture.  You may continue to have sex unless your health care provider tells you otherwise. Relieving pain and discomfort  Take frequent breaks and rest with your legs elevated if you have leg cramps or low back pain.  Take warm sitz baths to soothe any pain or discomfort caused by hemorrhoids. Use hemorrhoid cream if your health care provider approves.  Wear a good support bra to prevent discomfort from breast tenderness.  If you develop varicose veins: ? Wear support pantyhose or compression stockings as told by your healthcare provider. ? Elevate your feet for 15 minutes, 3-4 times a day. Prenatal care  Write down your questions. Take them to your prenatal visits.  Keep all your prenatal visits as told by your health care provider. This is important. Safety  Wear your seat belt at all times when driving.  Make  a list of emergency phone numbers, including numbers for family, friends, the hospital, and police and fire departments. General instructions  Avoid cat litter boxes and soil used by cats. These carry germs that can cause birth defects in the baby. If you have a cat, ask someone to clean the litter box for you.  Do not travel far distances unless it is absolutely necessary and only with the approval of your health care provider.  Do not use hot tubs, steam rooms, or saunas.  Do not drink alcohol.  Do not use any products that contain nicotine or tobacco, such as cigarettes and e-cigarettes. If you need help quitting, ask your health care provider.  Do not use any medicinal herbs or unprescribed drugs. These chemicals affect the formation and growth of the baby.  Do not douche or use tampons or scented sanitary pads.  Do not cross your legs for long periods of time.  To prepare for the arrival of your baby: ? Take prenatal classes to understand, practice, and ask questions about labor and delivery. ? Make a trial run to the hospital. ? Visit the hospital and tour the maternity area. ? Arrange for maternity or paternity leave through employers. ? Arrange for family and friends to take care of pets while you are in the hospital. ? Purchase a rear-facing car seat and make sure you know how to install it in your car. ? Pack your hospital bag. ? Prepare the baby's nursery. Make sure to remove all pillows and stuffed animals from the baby's crib to prevent suffocation.  Visit your dentist if you have not gone during your pregnancy. Use a soft toothbrush to brush your teeth and be gentle when you floss. Contact a health care provider if:  You are unsure if you are in labor or if your water has broken.  You become dizzy.  You have mild pelvic cramps, pelvic pressure, or nagging pain in your abdominal area.  You have lower back pain.  You have persistent nausea, vomiting, or  diarrhea.  You have an unusual or bad smelling vaginal discharge.  You have pain when you urinate. Get help right away if:  Your water breaks before 37 weeks.  You have regular contractions less than 5 minutes apart before 37 weeks.  You have a fever.  You are leaking fluid from your vagina.  You have spotting or bleeding from your vagina.  You have severe abdominal pain or cramping.  You have rapid weight loss or weight gain.  You have   shortness of breath with chest pain.  You notice sudden or extreme swelling of your face, hands, ankles, feet, or legs.  Your baby makes fewer than 10 movements in 2 hours.  You have severe headaches that do not go away when you take medicine.  You have vision changes. Summary  The third trimester is from week 28 through week 40, months 7 through 9. The third trimester is a time when the unborn baby (fetus) is growing rapidly.  During the third trimester, your discomfort may increase as you and your baby continue to gain weight. You may have abdominal, leg, and back pain, sleeping problems, and an increased need to urinate.  During the third trimester your breasts will keep growing and they will continue to become tender. A yellow fluid (colostrum) may leak from your breasts. This is the first milk you are producing for your baby.  False labor is a condition in which you feel small, irregular tightenings of the muscles in the womb (contractions) that eventually go away. These are called Braxton Hicks contractions. Contractions may last for hours, days, or even weeks before true labor sets in.  Signs of labor can include: abdominal cramps; regular contractions that start at 10 minutes apart and become stronger and more frequent with time; watery or bloody mucus discharge that comes from the vagina; increased pelvic pressure and dull back pain; and leaking of amniotic fluid. This information is not intended to replace advice given to you by your  health care provider. Make sure you discuss any questions you have with your health care provider. Document Released: 10/18/2001 Document Revised: 11/29/2016 Document Reviewed: 11/29/2016 Elsevier Interactive Patient Education  2019 [employer]  
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
## 2019-01-03 LAB — HSV CULTURE AND TYPING

## 2019-01-18 ENCOUNTER — Encounter: Payer: Self-pay | Admitting: Obstetrics and Gynecology

## 2019-01-18 ENCOUNTER — Other Ambulatory Visit: Payer: Self-pay

## 2019-01-18 ENCOUNTER — Ambulatory Visit (INDEPENDENT_AMBULATORY_CARE_PROVIDER_SITE_OTHER): Payer: Medicaid Other | Admitting: Obstetrics and Gynecology

## 2019-01-18 DIAGNOSIS — O09213 Supervision of pregnancy with history of pre-term labor, third trimester: Secondary | ICD-10-CM

## 2019-01-18 DIAGNOSIS — O09219 Supervision of pregnancy with history of pre-term labor, unspecified trimester: Secondary | ICD-10-CM

## 2019-01-18 DIAGNOSIS — Z3A33 33 weeks gestation of pregnancy: Secondary | ICD-10-CM

## 2019-01-18 DIAGNOSIS — O0990 Supervision of high risk pregnancy, unspecified, unspecified trimester: Secondary | ICD-10-CM
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
## 2019-01-31 ENCOUNTER — Other Ambulatory Visit: Payer: Self-pay

## 2019-01-31 ENCOUNTER — Ambulatory Visit (INDEPENDENT_AMBULATORY_CARE_PROVIDER_SITE_OTHER): Payer: Medicaid Other | Admitting: Obstetrics & Gynecology

## 2019-01-31 DIAGNOSIS — Z3A34 34 weeks gestation of pregnancy: Secondary | ICD-10-CM

## 2019-01-31 DIAGNOSIS — O0990 Supervision of high risk pregnancy, unspecified, unspecified trimester: Secondary | ICD-10-CM

## 2019-01-31 DIAGNOSIS — O0993 Supervision of high risk pregnancy, unspecified, third trimester: Secondary | ICD-10-CM

## 2019-01-31 NOTE — Progress Notes (Signed)
Pt is on the phone for televisit. ROB [redacted]w[redacted]d
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
## 2019-02-06 ENCOUNTER — Other Ambulatory Visit: Payer: Self-pay

## 2019-02-06 ENCOUNTER — Encounter (HOSPITAL_COMMUNITY): Payer: Self-pay

## 2019-02-06 ENCOUNTER — Inpatient Hospital Stay (HOSPITAL_COMMUNITY)
Admission: AD | Admit: 2019-02-06 | Discharge: 2019-02-09 | DRG: 807 | Disposition: A | Discharge status: Home / Self Care | Payer: Medicaid Other | Attending: Obstetrics & Gynecology | Admitting: Obstetrics & Gynecology

## 2019-02-06 DIAGNOSIS — Z3A35 35 weeks gestation of pregnancy: Secondary | ICD-10-CM | POA: Diagnosis not present

## 2019-02-06 DIAGNOSIS — Z8614 Personal history of Methicillin resistant Staphylococcus aureus infection: Secondary | ICD-10-CM

## 2019-02-06 DIAGNOSIS — O99824 Streptococcus B carrier state complicating childbirth: Secondary | ICD-10-CM | POA: Diagnosis not present

## 2019-02-06 DIAGNOSIS — O0990 Supervision of high risk pregnancy, unspecified, unspecified trimester: Secondary | ICD-10-CM

## 2019-02-06 DIAGNOSIS — R87810 Cervical high risk human papillomavirus (HPV) DNA test positive: Secondary | ICD-10-CM

## 2019-02-06 DIAGNOSIS — O09219 Supervision of pregnancy with history of pre-term labor, unspecified trimester: Secondary | ICD-10-CM

## 2019-02-06 DIAGNOSIS — R87610 Atypical squamous cells of undetermined significance on cytologic smear of cervix (ASC-US): Secondary | ICD-10-CM | POA: Diagnosis present

## 2019-02-06 DIAGNOSIS — Z9889 Other specified postprocedural states: Secondary | ICD-10-CM

## 2019-02-06 LAB — CBC
HCT: 30.5 % — ABNORMAL LOW (ref 36.0–46.0)
Hemoglobin: 9.3 g/dL — ABNORMAL LOW (ref 12.0–15.0)
MCH: 24.1 pg — ABNORMAL LOW (ref 26.0–34.0)
MCHC: 30.5 g/dL (ref 30.0–36.0)
MCV: 79 fL — ABNORMAL LOW (ref 80.0–100.0)
Platelets: 257 10*3/uL (ref 150–400)
RBC: 3.86 MIL/uL — ABNORMAL LOW (ref 3.87–5.11)
RDW: 12.7 % (ref 11.5–15.5)
WBC: 8.1 10*3/uL (ref 4.0–10.5)
nRBC: 0 % (ref 0.0–0.2)

## 2019-02-06 LAB — URINALYSIS, ROUTINE W REFLEX MICROSCOPIC
Bilirubin Urine: NEGATIVE
Glucose, UA: NEGATIVE mg/dL
Hgb urine dipstick: NEGATIVE
Ketones, ur: NEGATIVE mg/dL
Nitrite: NEGATIVE
Protein, ur: NEGATIVE mg/dL
Specific Gravity, Urine: 1.006 (ref 1.005–1.030)
pH: 6 (ref 5.0–8.0)

## 2019-02-06 LAB — TYPE AND SCREEN
ABO/RH(D): B POS
Antibody Screen: NEGATIVE

## 2019-02-06 MED ORDER — OXYCODONE-ACETAMINOPHEN 5-325 MG PO TABS: 1.0000 | ORAL_TABLET | ORAL | Status: DC | PRN

## 2019-02-06 MED ORDER — OXYCODONE-ACETAMINOPHEN 5-325 MG PO TABS: 2.0000 | ORAL_TABLET | ORAL | Status: DC | PRN

## 2019-02-06 MED ORDER — SOD CITRATE-CITRIC ACID 500-334 MG/5ML PO SOLN: 30.0000 mL | ORAL | Status: DC | PRN

## 2019-02-06 MED ORDER — ACETAMINOPHEN 325 MG PO TABS: 650.0000 mg | ORAL_TABLET | ORAL | Status: DC | PRN

## 2019-02-06 MED ORDER — LACTATED RINGERS IV SOLN: INTRAVENOUS | Status: DC

## 2019-02-06 MED ORDER — OXYTOCIN 40 UNITS IN NORMAL SALINE INFUSION - SIMPLE MED
2.5000 [IU]/h | INTRAVENOUS | Status: DC
  Filled 2019-02-06: qty 1000

## 2019-02-06 MED ORDER — ONDANSETRON HCL 4 MG/2ML IJ SOLN: 4.0000 mg | Freq: Four times a day (QID) | INTRAMUSCULAR | Status: DC | PRN

## 2019-02-06 MED ORDER — OXYTOCIN BOLUS FROM INFUSION
500.0000 mL | Freq: Once | INTRAVENOUS | Status: AC
  Administered 2019-02-07: 500 mL via INTRAVENOUS

## 2019-02-06 MED ORDER — MORPHINE SULFATE (PF) 4 MG/ML IV SOLN
6.0000 mg | Freq: Once | INTRAVENOUS | Status: AC
  Administered 2019-02-06: 6 mg via INTRAVENOUS
  Filled 2019-02-06: qty 2

## 2019-02-06 MED ORDER — PENICILLIN G 3 MILLION UNITS IVPB - SIMPLE MED
3.0000 10*6.[IU] | INTRAVENOUS | Status: DC
  Administered 2019-02-06 – 2019-02-07 (×2): 3 10*6.[IU] via INTRAVENOUS
  Filled 2019-02-06 (×2): qty 100

## 2019-02-06 MED ORDER — LIDOCAINE HCL (PF) 1 % IJ SOLN: 30.0000 mL | INTRAMUSCULAR | Status: DC | PRN

## 2019-02-06 MED ORDER — LACTATED RINGERS IV SOLN: 500.0000 mL | INTRAVENOUS | Status: DC | PRN

## 2019-02-06 MED ORDER — SODIUM CHLORIDE 0.9 % IV SOLN
5.0000 10*6.[IU] | Freq: Once | INTRAVENOUS | Status: AC
  Administered 2019-02-06: 5 10*6.[IU] via INTRAVENOUS
  Filled 2019-02-06: qty 5

## 2019-02-06 MED ORDER — FENTANYL CITRATE (PF) 100 MCG/2ML IJ SOLN
100.0000 ug | INTRAMUSCULAR | Status: DC | PRN
  Administered 2019-02-06 (×2): 100 ug via INTRAVENOUS
  Filled 2019-02-06 (×2): qty 2

## 2019-02-06 NOTE — MAU Note (Signed)
Feels some pressure in pelvis.  Thinks she has been feeling some contractions off and on, they were bad last night, but not too bad today.

## 2019-02-06 NOTE — Progress Notes (Signed)
Rachael Oliver is a 28 y.o. [phone] at [redacted]w[redacted]d by ultrasound admitted for Preterm labor  Subjective: Pt feeling contractions, denies need for pain medication at this time.   Objective: BP 108/62   Pulse 89   Temp 98.4 F (36.9 C) (Oral)   Resp 16   Ht 5\' 4"  (1.626 m)   Wt 75.8 kg   LMP 05/23/2018 (Approximate)   SpO2 98%   BMI 28.68 kg/m  No intake/output data recorded. No intake/output data recorded.  FHT:  FHR: 135 bpm, variability: moderate,  accelerations:  Present,  decelerations:  Present isolated variable UC:   irregular, every 2-10 minutes SVE:   Dilation: 4 Effacement (%): 100 Station: Ballotable Exam by:: L Leftwich Kirby CNM  Labs: Lab Results  Component Value Date   WBC 8.1 02/06/2019   HGB 9.3 (L) 02/06/2019   HCT 30.5 (L) 02/06/2019   MCV 79.0 (L) 02/06/2019   PLT 257 02/06/2019    Assessment / Plan: Spontaneous labor, progressing normally GBS unknown  Labor: No cervical change in 2 hours Preeclampsia:  n/a Fetal Wellbeing:  Overall category I with isolated variable Pain Control:  Labor support without medications I/D:  GBS unknown Anticipated MOD:  NSVD  Sharen Counter 02/06/2019, 7:21 PM

## 2019-02-06 NOTE — H&P (Signed)
Rachael Oliver is a 28 y.o. female [phone] @[redacted]w[redacted]d  presenting for preterm labor.  Her OB/Gyn hx is significant for the following plus LEEP/cryosurgery of cervix in 2016.  G1 2011 term vaginal delivery G2 2014 preterm labor with delivery at 35 weeks G3 2015 term vaginal delivery G4 9 week SAB  Nursing Staff Provider  Office Location  [facility] Dating   early U/S  Language  English Anatomy US    Flu Vaccine   decline 12/2 Genetic Screen  NIPS: low risk  AFP:   First Screen:  Quad:    TDaP vaccine   Declined 12/18/18 Hgb A1C or  GTT Early  Third trimester   Rhogam     LAB RESULTS   Feeding Plan Breast/Bottle Blood Type B/Positive/-- (10/02 0954)   Contraception BTL Antibody Negative (10/02 0954)  Circumcision Yes, Rubella <0.90 (10/02 0954)0.90 - non immune   Pediatrician  [facility] RPR Non Reactive (10/02 0954)   Support Person FOB HBsAg Negative (10/02 0954)   Prenatal Classes  HIV Non Reactive (10/02 0954)  BTL Consent  GBS  (For PCN allergy, check sensitivities)   VBAC Consent  Pap  Needs PP pap      Hgb Electro   normal    CF     SMA     Waterbirth  [ ]  Class [ ]  Consent [ ]  CNM visit   OB History    Gravida  5   Para  3   Term  2   Preterm  1   AB  1   Living  3     SAB  1   TAB      Ectopic      Multiple      Live Births  3          Past Medical History:  Diagnosis Date  . Asthma    as a child  . History of methicillin resistant staphylococcus aureus (MRSA)   . Infection    left elbow  . Preterm labor   . Syncope   . Syncope    once monthly with and without preg. Started approx in 2016  . Trichomonosis   . Vaginal Pap smear, abnormal    Past Surgical History:  Procedure Laterality Date  . ELBOW SURGERY Left   . FOOT SURGERY    . GYNECOLOGIC CRYOSURGERY    . I&D EXTREMITY Left 12/03/2015   Procedure: IRRIGATION AND DEBRIDEMENT LEFT ELBOW;  Surgeon: Tarry Kos, MD;  Location: [facility] OR;  Service: Orthopedics;   Laterality: Left;  . uterine biopsy     Family History: family history includes Diabetes in her mother. Social History:  reports that she has never smoked. She has never used smokeless tobacco. She reports that she does not drink alcohol or use drugs.     Maternal Diabetes: No Genetic Screening: Normal Maternal Ultrasounds/Referrals: Normal Fetal Ultrasounds or other Referrals:  None Maternal Substance Abuse:  No Significant Maternal Medications:  None Significant Maternal Lab Results:  Lab values include: Other:  Other Comments:  GBS unknown  ROS History Dilation: 4 Effacement (%): 90 Station: -3 Exam by:: V Rogers CNM  Blood pressure 128/81, pulse 81, temperature 98.2 F (36.8 C), temperature source Oral, resp. rate 16, weight 75.8 kg, last menstrual period 05/23/2018, SpO2 98 %, unknown if currently breastfeeding. Exam Physical Exam  Prenatal labs: ABO, Rh: B/Positive/-- (10/02 0954) Antibody: Negative (10/02 0954) Rubella: <0.90 (10/02 0954) RPR: Non Reactive (02/11 1055)  HBsAg: Negative (10/02 0954)  HIV: Non Reactive (02/11 1055)  GBS:     Assessment/Plan: [id number] at [redacted]w[redacted]d with preterm labor GBS unknown  Admit to L&D PCN for GBS prophylaxis BMZ held for pt post 35 weeks due to potential risk of worsening the development of unknown COVID infection  Observation, if labor progressing, anticipate SVD  Sharen Counter 02/06/2019, 4:25 PM

## 2019-02-07 ENCOUNTER — Encounter (HOSPITAL_COMMUNITY): Payer: Self-pay | Admitting: Obstetrics and Gynecology

## 2019-02-07 ENCOUNTER — Encounter: Payer: Medicaid Other | Admitting: Certified Nurse Midwife

## 2019-02-07 ENCOUNTER — Inpatient Hospital Stay (HOSPITAL_COMMUNITY): Payer: Medicaid Other | Admitting: Anesthesiology

## 2019-02-07 DIAGNOSIS — Z3A35 35 weeks gestation of pregnancy: Secondary | ICD-10-CM

## 2019-02-07 DIAGNOSIS — O99824 Streptococcus B carrier state complicating childbirth: Secondary | ICD-10-CM

## 2019-02-07 LAB — GC/CHLAMYDIA PROBE AMP (~~LOC~~) NOT AT ARMC
Chlamydia: NEGATIVE
Neisseria Gonorrhea: NEGATIVE

## 2019-02-07 LAB — RPR: RPR Ser Ql: NONREACTIVE

## 2019-02-07 MED ORDER — SIMETHICONE 80 MG PO CHEW: 80.0000 mg | CHEWABLE_TABLET | ORAL | Status: DC | PRN

## 2019-02-07 MED ORDER — FENTANYL-BUPIVACAINE-NACL 0.5-0.125-0.9 MG/250ML-% EP SOLN: 12.0000 mL/h | EPIDURAL | Status: DC | PRN

## 2019-02-07 MED ORDER — LACTATED RINGERS IV SOLN: 500.0000 mL | Freq: Once | INTRAVENOUS | Status: DC

## 2019-02-07 MED ORDER — PHENYLEPHRINE 40 MCG/ML (10ML) SYRINGE FOR IV PUSH (FOR BLOOD PRESSURE SUPPORT): 80.0000 ug | PREFILLED_SYRINGE | INTRAVENOUS | Status: DC | PRN

## 2019-02-07 MED ORDER — IBUPROFEN 600 MG PO TABS
600.0000 mg | ORAL_TABLET | Freq: Once | ORAL | Status: AC
  Administered 2019-02-07: 600 mg via ORAL
  Filled 2019-02-07: qty 1

## 2019-02-07 MED ORDER — DIBUCAINE 1 % RE OINT: 1.0000 "application " | TOPICAL_OINTMENT | RECTAL | Status: DC | PRN

## 2019-02-07 MED ORDER — FENTANYL-BUPIVACAINE-NACL 0.5-0.125-0.9 MG/250ML-% EP SOLN
EPIDURAL | Status: AC
  Filled 2019-02-07: qty 250

## 2019-02-07 MED ORDER — ZOLPIDEM TARTRATE 5 MG PO TABS: 5.0000 mg | ORAL_TABLET | Freq: Every evening | ORAL | Status: DC | PRN

## 2019-02-07 MED ORDER — COCONUT OIL OIL: 1.0000 "application " | TOPICAL_OIL | Status: DC | PRN

## 2019-02-07 MED ORDER — TETANUS-DIPHTH-ACELL PERTUSSIS 5-2.5-18.5 LF-MCG/0.5 IM SUSP: 0.5000 mL | Freq: Once | INTRAMUSCULAR | Status: DC

## 2019-02-07 MED ORDER — DIPHENHYDRAMINE HCL 25 MG PO CAPS: 25.0000 mg | ORAL_CAPSULE | Freq: Four times a day (QID) | ORAL | Status: DC | PRN

## 2019-02-07 MED ORDER — EPHEDRINE 5 MG/ML INJ: 10.0000 mg | INTRAVENOUS | Status: DC | PRN

## 2019-02-07 MED ORDER — ONDANSETRON HCL 4 MG/2ML IJ SOLN: 4.0000 mg | INTRAMUSCULAR | Status: DC | PRN

## 2019-02-07 MED ORDER — BENZOCAINE-MENTHOL 20-0.5 % EX AERO
1.0000 "application " | INHALATION_SPRAY | CUTANEOUS | Status: DC | PRN
  Administered 2019-02-07 – 2019-02-09 (×2): 1 via TOPICAL
  Filled 2019-02-07 (×2): qty 56

## 2019-02-07 MED ORDER — PRENATAL MULTIVITAMIN CH
1.0000 | ORAL_TABLET | Freq: Every day | ORAL | Status: DC
  Administered 2019-02-07 – 2019-02-09 (×3): 1 via ORAL
  Filled 2019-02-07 (×3): qty 1

## 2019-02-07 MED ORDER — WITCH HAZEL-GLYCERIN EX PADS: 1.0000 "application " | MEDICATED_PAD | CUTANEOUS | Status: DC | PRN

## 2019-02-07 MED ORDER — ACETAMINOPHEN 325 MG PO TABS
650.0000 mg | ORAL_TABLET | ORAL | Status: DC | PRN
  Administered 2019-02-07 – 2019-02-09 (×3): 650 mg via ORAL
  Filled 2019-02-07 (×3): qty 2

## 2019-02-07 MED ORDER — ONDANSETRON HCL 4 MG PO TABS: 4.0000 mg | ORAL_TABLET | ORAL | Status: DC | PRN

## 2019-02-07 MED ORDER — SENNOSIDES-DOCUSATE SODIUM 8.6-50 MG PO TABS
2.0000 | ORAL_TABLET | ORAL | Status: DC
  Administered 2019-02-08 (×2): 2 via ORAL
  Filled 2019-02-07 (×2): qty 2

## 2019-02-07 MED ORDER — LIDOCAINE HCL (PF) 1 % IJ SOLN
INTRAMUSCULAR | Status: DC | PRN
  Administered 2019-02-07 (×2): 5 mL via EPIDURAL

## 2019-02-07 MED ORDER — DIPHENHYDRAMINE HCL 50 MG/ML IJ SOLN
12.5000 mg | INTRAMUSCULAR | Status: DC | PRN
  Administered 2019-02-07: 12.5 mg via INTRAVENOUS
  Filled 2019-02-07: qty 1

## 2019-02-07 MED ORDER — IBUPROFEN 600 MG PO TABS
600.0000 mg | ORAL_TABLET | Freq: Four times a day (QID) | ORAL | Status: DC
  Administered 2019-02-07 – 2019-02-09 (×10): 600 mg via ORAL
  Filled 2019-02-07 (×11): qty 1

## 2019-02-07 MED ORDER — SODIUM CHLORIDE (PF) 0.9 % IJ SOLN
INTRAMUSCULAR | Status: DC | PRN
  Administered 2019-02-07: 12 mL/h via EPIDURAL

## 2019-02-07 MED ORDER — MEASLES, MUMPS & RUBELLA VAC IJ SOLR
0.5000 mL | Freq: Once | INTRAMUSCULAR | Status: AC
  Administered 2019-02-09: 0.5 mL via SUBCUTANEOUS
  Filled 2019-02-07: qty 0.5

## 2019-02-07 NOTE — Lactation Note (Addendum)
This note was copied from a baby's chart. Lactation Consultation Note  Patient Name: Rachael Oliver Date: 02/07/2019 Reason for consult: Initial assessment;Late-preterm 34-36.6wks;Infant < 6lbs  Visited with P4 Mom of LPTI at [redacted]w[redacted]d weighing 5 lbs 7.1.  Baby 12 hrs old.   Baby has latched to breast 5 times for 5-20 mins.  Mom educated on breast massage and hand expression.    LPTI guidelines reviewed with Mom.  Encouraged minimal stimulation, and lots of STS.  Mom informed of probable increased sleepiness after first day of life.    RN set up DEBP and assisted Mom to pump for first time. 1 ml EBM spoon fed to baby.  LC took apart pump parts and washed, rinsed and set in separate basin with paper towels to dry.  Baby swaddled and quiet alert in bed.  Offered to assist Mom with positioning and latching as baby was showing subtle cues.  Mom holding baby in cradle hold.  LC assisted in sandwiching breast, and hand expressing drop of colostrum to entice baby.  Baby not interested at this time.  Baby started to fuss.  Placed baby STS on Mom's chest.  Instructed Mom to offer breast with cues.  Plan- 1- Keep baby STS as much as possible 2- Offer the breast with any cue.   3- If baby unable to attain a deep latch to breast, or is too sleepy to latch and feed well, Mom to supplement baby with 22 cal formula per feeding guidelines on LPTI information handout. 4- Pump both breasts 15 mins on initiation setting, adding breast massage and hand expression.  Feed baby any EBM expressed. 5- Ask for assistance prn.  Lactation brochure left with Mom.  Mom has WIC, and is interested in obtaining a DEBP on discharge.  Explained how the [facility] loaner program works, $30 deposit.  Mom is interested.  Faxed referral to [facility].  Interventions Interventions: Breast feeding basics reviewed;Assisted with latch;Skin to skin;Breast massage;Hand express;Support pillows;DEBP  Lactation Tools Discussed/Used WIC Program:  Yes Pump Review: Setup, frequency, and cleaning;Milk Storage;Other (comment)(cleaning) Initiated by:: Amador Cunas., RN Date initiated:: 02/07/19   Consult Status Consult Status: Follow-up Date: 02/08/19 Follow-up type: In-patient    Judee Clara 02/07/2019, 3:03 PM

## 2019-02-07 NOTE — Progress Notes (Signed)
OB/GYN Faculty Practice: Labor Progress Note  Late entry because of other responsibility on unit at time of face-to-face encounter.  Subjective: Doing well, states contraction pain still bothering her. Has received a couple of dose of IV fentanyl with only about 20 minutes of relief.   Objective: BP 116/68   Pulse 98   Temp 98.6 F (37 C) (Oral)   Resp 18   Ht 5\' 4"  (1.626 m)   Wt 75.8 kg   LMP 05/23/2018 (Approximate)   SpO2 98%   BMI 28.68 kg/m  Gen: uncomfortable during contractions Dilation: 5 Effacement (%): 100 Cervical Position: Middle Station: -3 Presentation: Vertex Exam by:: E. Poore, RNC  Assessment and Plan: 28 y.o. [id number] [redacted]w[redacted]d here with preterm labor.   Labor: Preterm labor, expectant management. Getting more uncomfortable, desires epidural. Discussed waiting until more active labor given preterm. Will try IV morphine once. -- pain control: IV morphine -- PPH Risk: low  Fetal Well-Being: EFW 5lbs by Leopolds. Cephalic by sutures.  -- Category I - continuous fetal monitoring  -- GBS unknown - receiving PCN   Laurel S. Earlene Plater, DO OB/GYN Fellow, Faculty Practice  12:53 AM

## 2019-02-07 NOTE — Progress Notes (Signed)
OB/GYN Faculty Practice: Labor Progress Note  Late entry because of other responsibility on unit at time of face-to-face encounter.  Subjective: More uncomfortable, not much relief from morphine.   Objective: BP 124/67   Pulse 82   Temp 98.6 F (37 C) (Oral)   Resp 18   Ht 5\' 4"  (1.626 m)   Wt 75.8 kg   LMP 05/23/2018 (Approximate)   SpO2 100%   BMI 28.68 kg/m  Gen: uncomfortable during contractions Dilation: 7 Effacement (%): 80 Cervical Position: Middle Station: -2 Presentation: Vertex Exam by:: Dr. Marlis Edelson  Assessment and Plan: 28 y.o. [id number] [redacted]w[redacted]d here with preterm labor.   Labor: Preterm labor, transitioning into active labor. Okay to get epidural.  -- pain control: getting epidural -- PPH Risk: low  Fetal Well-Being: EFW 5lbs by Leopolds. Cephalic by sutures.  -- Category I - continuous fetal monitoring  -- GBS unknown - receiving PCN   Rachael Whetzel S. Earlene Plater, DO OB/GYN Fellow, Faculty Practice

## 2019-02-07 NOTE — Anesthesia Postprocedure Evaluation (Signed)
Anesthesia Post Note  Patient: Rachael Oliver  Procedure(s) Performed: AN AD HOC LABOR EPIDURAL     Patient location during evaluation: Mother Baby Anesthesia Type: Epidural Level of consciousness: awake and alert Pain management: pain level controlled Vital Signs Assessment: post-procedure vital signs reviewed and stable Respiratory status: spontaneous breathing, nonlabored ventilation and respiratory function stable Cardiovascular status: stable Postop Assessment: no headache, no backache, epidural receding, no apparent nausea or vomiting, patient able to bend at knees, able to ambulate and adequate PO intake Anesthetic complications: no    Last Vitals:  Vitals:   02/07/19 0530 02/07/19 0915  BP: 115/67 109/68  Pulse: 64 66  Resp: 16 18  Temp: 36.6 C 36.7 C  SpO2: 100%     Last Pain:  Vitals:   02/07/19 1030  TempSrc:   PainSc: 6    Pain Goal: Patients Stated Pain Goal: 0 (02/06/19 1514)                 Evin Loiseau Hristova

## 2019-02-07 NOTE — Discharge Summary (Signed)
Obstetrics Discharge Summary OB/GYN Faculty Practice   Patient Name: Rachael Oliver DOB: 11-15-1990 MRN: [id number]  Date of admission: 02/06/2019 Delivering MD: Arvilla Market   Date of discharge: 02/09/2019  Admitting diagnosis: 36WKS PRESSURE Intrauterine pregnancy: [redacted]w[redacted]d     Secondary diagnosis:   Principal Problem:   Preterm labor Active Problems:   ASCUS with positive high risk HPV cervical   Previous preterm labor affecting pregnancy, antepartum    Discharge diagnosis: Preterm Pregnancy Delivered                                            Postpartum procedures: None  Complications: None  Outpatient Follow-Up: [ ]  desires interval BTL [ ]  received depoprovera prior to discharge  Hospital course: Rachael Oliver is a 28 y.o. [redacted]w[redacted]d who was admitted for preterm labor. Her pregnancy was complicated by abnormal Pap smear. Her labor course was notable for transition into active labor, epidural placement. Delivery was uncomplicated. Please see delivery/op note for additional details. Her postpartum course was uncomplicated. She was breastfeeding without difficulty and also supplementing. By day of discharge, she was passing flatus, urinating, eating and drinking without difficulty. Her pain was well-controlled, and she was discharged home with ibuprofen. She will follow-up in clinic in 4-6 weeks.   Physical exam  Vitals:   02/08/19 0546 02/08/19 1500 02/08/19 2110 02/09/19 0413  BP: (!) 100/55  121/74 107/63  Pulse: (!) 59 62 60 60  Resp: 16   (!) 60  Temp: 98.6 F (37 C) 98 F (36.7 C) 98 F (36.7 C) (!) 97.5 F (36.4 C)  TempSrc: Oral  Oral Axillary  SpO2: 100% 100%    Weight:      Height:       General: well-appearing, NAD  Lochia: appropriate Uterine Fundus: firm Incision: N/A DVT Evaluation: No significant calf/ankle edema. Labs: Lab Results  Component Value Date   WBC 8.1 02/06/2019   HGB 9.3 (L) 02/06/2019   HCT 30.5 (L) 02/06/2019   MCV 79.0 (L)  02/06/2019   PLT 257 02/06/2019   CMP Latest Ref Rng & Units 12/10/2017  Glucose 65 - 99 mg/dL 73  BUN 6 - 20 mg/dL 12  Creatinine 6.12 - 2.44 mg/dL 9.75(P)  Sodium 005 - 110 mmol/L 137  Potassium 3.5 - 5.1 mmol/L 3.9  Chloride 101 - 111 mmol/L 100(L)  CO2 22 - 32 mmol/L 21(L)  Calcium 8.9 - 10.3 mg/dL 9.8  Total Protein 6.5 - 8.1 g/dL 9.3(H)  Total Bilirubin 0.3 - 1.2 mg/dL 0.9  Alkaline Phos 38 - 126 U/L 92  AST 15 - 41 U/L 56(H)  ALT 14 - 54 U/L 32    Discharge instructions: Per After Visit Summary and "Baby and Me Booklet"  After visit meds:  Allergies as of 02/09/2019   No Known Allergies     Medication List    STOP taking these medications   hydrocortisone cream 1 %     TAKE these medications   acetaminophen 500 MG tablet Commonly known as:  TYLENOL Take 1,000 mg by mouth every 6 (six) hours as needed for moderate pain.   ibuprofen 800 MG tablet Commonly known as:  ADVIL,MOTRIN Take 1 tablet (800 mg total) by mouth 3 (three) times daily.   senna-docusate 8.6-50 MG tablet Commonly known as:  Senokot-S Take 2 tablets by mouth at bedtime as needed for  mild constipation.   Vitafol Gummies 3.33-0.333-34.8 MG Chew Chew 3 each by mouth daily.      Postpartum contraception: interval BTL Diet: Routine Diet Activity: Advance as tolerated. Pelvic rest for 6 weeks.   Follow-up Appt: Future Appointments  Date Time Provider Department Center  03/08/2019 10:00 AM Adam Phenix, MD [facility] None   Please schedule this patient for Postpartum visit in: 4 weeks with the following provider: Any provider Low risk pregnancy complicated by: none Delivery mode:  SVD Anticipated Birth Control:  Plans Interval BTL PP Procedures needed: no Schedule Integrated BH visit: no  Newborn Data: Live born female  Birth Weight: 5 lb 7.1 oz (2469 g) APGAR: 9, 9  Newborn Delivery   Birth date/time:  02/07/2019 02:28:00 Delivery type:  Vaginal, Spontaneous    Baby Feeding: Both  breast and bottle Disposition:home with mother or rooming-in (infant preterm, still to be determined)   Gwenevere Abbot, MD  OB/GYN Fellow, Faculty Practice

## 2019-02-07 NOTE — Anesthesia Preprocedure Evaluation (Signed)
Anesthesia Evaluation  °Patient identified by MRN, date of birth, ID band °Patient awake ° ° ° °Reviewed: °Allergy & Precautions, H&P , NPO status , Patient's Chart, lab work & pertinent test results ° °Airway °Mallampati: II ° ° °Neck ROM: full ° ° ° Dental °  °Pulmonary °asthma ,  °  °breath sounds clear to auscultation ° ° ° ° ° ° Cardiovascular °negative cardio ROS ° ° °Rhythm:regular Rate:Normal ° ° °  °Neuro/Psych °  ° GI/Hepatic °  °Endo/Other  ° ° Renal/GU °  ° °  °Musculoskeletal ° ° Abdominal °  °Peds ° Hematology °  °Anesthesia Other Findings ° ° Reproductive/Obstetrics °(+) Pregnancy ° °  ° ° ° ° ° ° ° ° ° ° ° ° ° °  °  ° ° ° ° ° ° ° ° °Anesthesia Physical °Anesthesia Plan ° °ASA: II ° °Anesthesia Plan: Epidural  ° °Post-op Pain Management:   ° °Induction: Intravenous ° °PONV Risk Score and Plan: 2 and Treatment may vary due to age or medical condition ° °Airway Management Planned: Natural Airway ° °Additional Equipment:  ° °Intra-op Plan:  ° °Post-operative Plan:  ° °Informed Consent: I have reviewed the patients History and Physical, chart, labs and discussed the procedure including the risks, benefits and alternatives for the proposed anesthesia with the patient or authorized representative who has indicated his/her understanding and acceptance.  ° ° ° ° ° °Plan Discussed with: Anesthesiologist ° °Anesthesia Plan Comments:   ° ° ° ° ° ° °Anesthesia Quick Evaluation ° °

## 2019-02-07 NOTE — Anesthesia Procedure Notes (Signed)
Epidural Patient location during procedure: OB Start time: 02/07/2019 1:18 AM End time: 02/07/2019 1:28 AM  Staffing Anesthesiologist: Achille Rich, MD Performed: anesthesiologist   Preanesthetic Checklist Completed: patient identified, site marked, pre-op evaluation, timeout performed, IV checked, risks and benefits discussed and monitors and equipment checked  Epidural Patient position: sitting Prep: DuraPrep Patient monitoring: heart rate, cardiac monitor, continuous pulse ox and blood pressure Approach: midline Location: L2-L3 Injection technique: LOR saline  Needle:  Needle type: Tuohy  Needle gauge: 17 G Needle length: 9 cm Needle insertion depth: 6 cm Catheter type: closed end flexible Catheter size: 19 Gauge Catheter at skin depth: 12 cm Test dose: negative and Other  Assessment Events: blood not aspirated, injection not painful, no injection resistance and negative IV test  Additional Notes Informed consent obtained prior to proceeding including risk of failure, 1% risk of PDPH, risk of minor discomfort and bruising.  Discussed rare but serious complications including epidural abscess, permanent nerve injury, epidural hematoma.  Discussed alternatives to epidural analgesia and patient desires to proceed.  Timeout performed pre-procedure verifying patient name, procedure, and platelet count.  Patient tolerated procedure well. Reason for block:procedure for pain

## 2019-02-08 LAB — CULTURE, BETA STREP (GROUP B ONLY)

## 2019-02-08 NOTE — Progress Notes (Signed)
Post Partum Day 1 Subjective: No complaints, doing well this morning. Passed small (quarter-sized clot) overnight but overall thinks bleeding is less. No trouble using bathroom.  Objective: Blood pressure (!) 100/55, pulse (!) 59, temperature 98.6 F (37 C), temperature source Oral, resp. rate 16, height _0  (1.626 m), weight 75.8 kg, last menstrual period 05/23/2018, SpO2 100 %, unknown if currently breastfeeding.  Physical Exam:  General: alert, well-appearing, NAD Lochia: appropriate Uterine Fundus: firm Incision: n/a DVT Evaluation: No significant calf/ankle edema  Recent Labs    02/06/19 1625  HGB 9.3*  HCT 30.5*    Assessment/Plan: Plan for discharge tomorrow  Routine postpartum care Needs MMR prior to discharge    LOS: 2 days   Glenice Bow, DO 02/08/2019, 9:23 AM

## 2019-02-08 NOTE — Procedures (Signed)
Name:  [name] DOB:   Sep 19, 1991 MRN:   [id number]  Reason for referral: Failed newborn screen x2 on the right side. Screener notes that "baby moving too much".  Screening Protocol:   Test: Distortion Product Otoacoustic Emissions (DPOAE) 2000 Hz -8,000 Hz Equipment: Biologic AuDX Pro Test [facility] Pain: None  Screening Results:   Note: the baby slept minimally. A diagnostic hearing screen was attempted with identifiable wave V at 35dBHL; however, due to infant activity level, waveform duplication could not be completed. After 60 minutes of attempt, DPOAE's were completed and showed symmetrical and present results bilaterally.   Right Ear: Pass Left Ear: Pass  Note: A passing result does not imply that hearing thresholds are within normal limits (WNL).  DPOAE screening can miss minimal-mild hearing losses and Auditory Neuropathy Spectrum Disorder (ANSD)   Family Education:  Gave a PASS pamphlet with hearing and speech developmental milestone to [name]'s mother so the family can monitor developmental milestones. If speech/language delays or hearing difficulties are observed the family is to contact the child's primary care physician. There is no reported family history of hearing loss.     Recommendations:  No further testing is recommended at this time. If speech/language delays or hearing difficulties are observed further audiological testing is recommended.         If you have any questions, please call [phone].  Tatumn Corbridge L. Kate Sable, Au.D., CCC-A Doctor of Audiology  02/08/2019  1:07 PM

## 2019-02-08 NOTE — Lactation Note (Signed)
This note was copied from a baby's chart. Lactation Consultation Note  Patient Name: Rachael Oliver [id number] Date: 02/08/2019 Reason for consult: Follow-up assessment;Late-preterm 34-36.6wks;Infant < 6lbs   Mom states she pumped once yesterday for one hour but did not get much out.  Mom tells LC she is only interested in bf maybe a month, but not that interested in breastfeeding and does not desire to pump.  She is not interested in a [facility] loaner.  Infant cueing while getting hearing screen so mom put infant to breast, LC assisted.  Football hold was attempted but mom described it as uncomfortable and placed infant in cradle hold.  Infant would not sustain latch.  Mom feels he nurses better on other side, left.  Mom placed infant in cradle hold, LC offered assist with cross cradle, mom prefers cradle.  Infant latched shallow with lips visible on nipple.  LC provided blankets and pillows to aid in bringing infant closer.  Swallows heard on left side, good rhythmic jaw movement noted.  LC praised mom for the good effort to get infant latched and for babies good feed.  Explained to mom that infant should be this close when bf, cheeks to breast.  No clicking heard.  LC reviewed LPTI guidelines with mom.  Mom is breastfeeding then giving Neosure 22 out of a bottle to infant.    LC reviewed engorgement prevention, what to do if mom decides to wean, and use of hand pump.  LC used universal hand pump parts and put together for mom and reviewed parts, cleaning, storing.    Mom has phone number for lactation and LC encouraged her to call if she has concerns or questions after discharge.  LC encouraged mom to call out for BF assistance for the rest of her inpatient stay if she desires.   Maternal Data    Feeding Feeding Type: Breast Fed  LATCH Score Latch: Repeated attempts needed to sustain latch, nipple held in mouth throughout feeding, stimulation needed to elicit sucking reflex.  Audible  Swallowing: A few with stimulation  Type of Nipple: Everted at rest and after stimulation  Comfort (Breast/Nipple): Soft / non-tender  Hold (Positioning): Assistance needed to correctly position infant at breast and maintain latch.  LATCH Score: 7  Interventions Interventions: Breast feeding basics reviewed;Assisted with latch;Skin to skin;Breast massage;Hand express;Breast compression;Position options;Support pillows;Adjust position  Lactation Tools Discussed/Used     Consult Status Consult Status: Follow-up Date: 02/09/19 Follow-up type: In-patient    Maryruth Hancock [facility] 02/08/2019, 12:45 PM

## 2019-02-09 MED ORDER — IBUPROFEN 800 MG PO TABS: 800.0000 mg | ORAL_TABLET | Freq: Three times a day (TID) | ORAL | 0 refills | Status: DC

## 2019-02-09 MED ORDER — SENNOSIDES-DOCUSATE SODIUM 8.6-50 MG PO TABS: 2.0000 | ORAL_TABLET | Freq: Every evening | ORAL | Status: DC | PRN

## 2019-02-09 MED ORDER — MEDROXYPROGESTERONE ACETATE 150 MG/ML IM SUSP
150.0000 mg | Freq: Once | INTRAMUSCULAR | Status: AC
  Administered 2019-02-09: 150 mg via INTRAMUSCULAR
  Filled 2019-02-09: qty 1

## 2019-02-09 NOTE — Lactation Note (Signed)
This note was copied from a baby's chart. Lactation Consultation Note Returned to rm. Mom sitting up finished ice.  LC applied warm pack to breast for a few minutes. Reverse pressure and reverse massage to breast to soften. Mom used DEBP. LC massaged breast. Mom pump 30 ml. Mom laid back again applied ICE. Instructed mom to apply ICE for 20 min. Pump in 2 hrs. Encouraged mom once she gets engorgement under control, try not to let herself get engorged.  Offer breast to baby, then supplement w/BM, then if needed give the formula. Mom should be able to provide enough BM for supplementation if the baby BF well. Encouraged mom to cont. ICE as needed and pump.  Patient Name: Rachael Oliver [id number] Date: 02/09/2019 Reason for consult: Follow-up assessment;Engorgement   Maternal Data    Feeding Feeding Type: Breast Milk Nipple Type: Slow - flow  LATCH Score Latch: Grasps breast easily, tongue down, lips flanged, rhythmical sucking.  Audible Swallowing: Spontaneous and intermittent  Type of Nipple: Everted at rest and after stimulation  Comfort (Breast/Nipple): Engorged, cracked, bleeding, large blisters, severe discomfort  Hold (Positioning): Assistance needed to correctly position infant at breast and maintain latch.  LATCH Score: 7  Interventions Interventions: DEBP;Breast compression;Breast massage;Reverse pressure;Ice  Lactation Tools Discussed/Used Tools: Pump Breast pump type: Double-Electric Breast Pump   Consult Status Consult Status: Follow-up Date: 02/09/19 Follow-up type: In-patient    Rachael Oliver, Diamond Nickel 02/09/2019, 2:17 AM

## 2019-02-09 NOTE — Lactation Note (Signed)
This note was copied from a baby's chart. Lactation Consultation Note  Patient Name: Rachael Oliver [id number] Date: 02/09/2019 Reason for consult: Follow-up assessment;Late-preterm 34-36.6wks Baby is 55 hours old/8% weight loss.  Mom is engorged but not icing and pumping frequently enough.  She states she is not able to get milk with pumping.  Baby has been sleepy at breast and taking inadequate volumes for supplementation.  He is taking 10-15 mls per feeding.  Mom states he is sleepy with bottle.  Instructed to increase volume to 30+ mls.  Assisted with pumping on standard setting.  Milk flow good after a few minutes into cycle.  Breast massage done during pumping and she obtained 20 mls of transitional milk from each breast.  Some softening and relief noted.  Stressed importance of icing and pumping every 2 hours.  Ice packs provided and placed on breasts.  Encouraged to call for assist prn.  Maternal Data    Feeding Feeding Type: Bottle Fed - Breast Milk  LATCH Score                   Interventions    Lactation Tools Discussed/Used     Consult Status Consult Status: Follow-up Date: 02/10/19 Follow-up type: In-patient    Huston Foley 02/09/2019, 10:24 AM

## 2019-02-09 NOTE — Lactation Note (Signed)
This note was copied from a baby's chart. Lactation Consultation Note Mom engorged 46 hrs old baby.  Baby latched on gulping.  Mom's breast hard w/knots, tender to touch, breast hurting up to axillary.  Nothing coming out when expressed. Reverse pressure to areola then softened nipple. Milk flowing. Started pumping w/DEBP. LC massaged breast while mom pumped. Mom collected 30 ml. FOB feeding to baby. Mom pumped 25 min. Laid back flat w/ICE to breast over cloth.  LC will return to massage again.   Patient Name: Rachael Oliver [id number] Date: 02/09/2019 Reason for consult: Follow-up assessment;Engorgement;Late-preterm 34-36.6wks;Infant < 6lbs   Maternal Data    Feeding Feeding Type: Breast Milk Nipple Type: Slow - flow  LATCH Score Latch: Grasps breast easily, tongue down, lips flanged, rhythmical sucking.  Audible Swallowing: Spontaneous and intermittent  Type of Nipple: Everted at rest and after stimulation  Comfort (Breast/Nipple): Engorged, cracked, bleeding, large blisters, severe discomfort  Hold (Positioning): Assistance needed to correctly position infant at breast and maintain latch.  LATCH Score: 7  Interventions Interventions: Breast feeding basics reviewed;Breast compression;Assisted with latch;Adjust position;Skin to skin;Support pillows;DEBP;Breast massage;Position options;Ice;Hand express;Expressed milk;Reverse pressure  Lactation Tools Discussed/Used Tools: Pump Breast pump type: Double-Electric Breast Pump   Consult Status Consult Status: Follow-up Date: 02/09/19 Follow-up type: In-patient    Aicha Clingenpeel, Diamond Nickel 02/09/2019, 1:05 AM

## 2019-02-10 ENCOUNTER — Ambulatory Visit: Payer: Self-pay

## 2019-02-10 NOTE — Lactation Note (Signed)
This note was copied from a baby's chart. Lactation Consultation Note  Patient Name: Rachael Oliver [id number] Date: 02/10/2019 Reason for consult: Follow-up assessment;Late-preterm 34-36.6wks;Infant < 6lbs  P4 mother whose infant is now 52 hours old.  Mother has been supplementing this LPTI born at 35+6 weeks and weighing < 6 lbs.  Mother stated she has no questions/concerns related to breast feeding or supplementing.  She had just finished pumping with the DEBP and obtained 35 mls of milk.  She also stated she has a supply in her refrigerator for baby.  Mother knows will be pumping and bottle feeding after discharge.  Obtained a [name] loaner pump for mother.  Instructions on how to return pump explained and mother will return within 12 days.  She will be purchasing her own DEBP after discharge.    She is familiar with engorgement and had to be assisted with this yesterday.  No further questions.  RN updated and will do discharge soon.   Maternal Data Formula Feeding for Exclusion: No Has patient been taught Hand Expression?: Yes Does the patient have breastfeeding experience prior to this delivery?: Yes  Feeding Feeding Type: Breast Milk  LATCH Score                   Interventions    Lactation Tools Discussed/Used     Consult Status Consult Status: Complete Date: 02/10/19 Follow-up type: Call as needed    [name] R [name] 02/10/2019, 12:22 PM

## 2019-02-10 NOTE — Lactation Note (Signed)
This note was copied from a baby's chart. Lactation Consultation Note  Patient Name: Rachael Oliver FAOZH'Y Date: 02/10/2019  P4, 71 hour LPTI with weight loss of -8%. Mom has started supplementing with formula her choice and due infant weight loss. Mom had ongoing engorgement in past 24 hours. LC asked mom if she want assistance with latching infant to breast once her breast has soften. Per mom, she doesn't want to latch infant to breast she only wants to pump due to past history of infant falling asleep at breast and she is planing to return to work 6 weeks or less. Mom was attempting pump when LC entered the room , with breast assessment  LC noticed mom is still engorged. LC used hand pump to help with engorgement  and ice. Notice mom nipples had edema and re-fitted with 27 mm breast flange which mom felt was more comfortable and milk was flowing. Mom pumped 30 ml of colostrum that was feed to infant using green slow flow nipple after pumping .  LC made make shift bra which mom can use with DEBP to be hand free and apply ice bags to breast while pumping. Green engorgement sheet given and explained about engorgement treatment and prevention. Mom plans to ice for 20 minutes after feeding infant. Mom knows to pump every 3 hours and to pump or hand express if breast becomes full. Mom knows to call Nurse or LC if she has any questions or concerns.   Maternal Data    Feeding    LATCH Score                   Interventions    Lactation Tools Discussed/Used     Consult Status      Danelle Earthly 02/10/2019, 1:44 AM

## 2019-02-12 ENCOUNTER — Telehealth: Payer: Self-pay

## 2019-02-12 NOTE — Telephone Encounter (Signed)
Pt called and reports painful "lumps" in her breasts. She is currently breastfeeding (exclusively pumping). Pt denies fever or redness/streaks on breasts. I advised patient to apply warm compresses and/or warm shower and massage breasts in a downward motion towards the nipple before each feeding/pump session. Also advised pt to call us if she becomes feverish or has redness/streaks on her breasts. Pt verbalizes understanding.

## 2019-02-12 NOTE — Telephone Encounter (Signed)
error 

## 2019-03-08 ENCOUNTER — Other Ambulatory Visit: Payer: Self-pay

## 2019-03-08 ENCOUNTER — Ambulatory Visit: Payer: Medicaid Other | Admitting: Obstetrics & Gynecology

## 2019-03-08 NOTE — Progress Notes (Signed)
error 

## 2019-03-14 ENCOUNTER — Encounter: Payer: Medicaid Other | Admitting: Certified Nurse Midwife

## 2019-03-18 ENCOUNTER — Ambulatory Visit (INDEPENDENT_AMBULATORY_CARE_PROVIDER_SITE_OTHER): Payer: Medicaid Other | Admitting: Obstetrics

## 2019-03-18 ENCOUNTER — Encounter: Payer: Self-pay | Admitting: Obstetrics

## 2019-03-18 ENCOUNTER — Other Ambulatory Visit: Payer: Self-pay

## 2019-03-18 DIAGNOSIS — Z1389 Encounter for screening for other disorder: Secondary | ICD-10-CM

## 2019-03-18 DIAGNOSIS — Z3042 Encounter for surveillance of injectable contraceptive: Secondary | ICD-10-CM

## 2019-03-18 MED ORDER — VITAFOL GUMMIES 3.33-0.333-34.8 MG PO CHEW: 3.0000 | CHEWABLE_TABLET | Freq: Every day | ORAL | 11 refills | Status: DC

## 2019-03-18 MED ORDER — MEDROXYPROGESTERONE ACETATE 150 MG/ML IM SUSP: 150.0000 mg | INTRAMUSCULAR | 4 refills | Status: DC
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
## 2019-04-17 ENCOUNTER — Ambulatory Visit: Payer: Medicaid Other

## 2019-04-17 ENCOUNTER — Other Ambulatory Visit (HOSPITAL_COMMUNITY)
Admission: RE | Admit: 2019-04-17 | Discharge: 2019-04-17 | Disposition: A | Discharge status: Home / Self Care | Payer: Medicaid Other | Source: Ambulatory Visit | Attending: Obstetrics | Admitting: Obstetrics

## 2019-04-17 ENCOUNTER — Other Ambulatory Visit: Payer: Self-pay

## 2019-04-17 DIAGNOSIS — N898 Other specified noninflammatory disorders of vagina: Secondary | ICD-10-CM | POA: Diagnosis present

## 2019-04-17 DIAGNOSIS — Z113 Encounter for screening for infections with a predominantly sexual mode of transmission: Secondary | ICD-10-CM

## 2019-04-17 NOTE — Progress Notes (Signed)
Pt is here with c/o vaginal discharge. She would like to have STD testing (swab and bloodwork). Pt is not currently breastfeeding.

## 2019-04-18 LAB — HEPATITIS C ANTIBODY: Hep C Virus Ab: <0.1 s/co ratio (ref 0.0–0.9)

## 2019-04-18 LAB — HEPATITIS B SURFACE ANTIGEN: Hepatitis B Surface Ag: NEGATIVE

## 2019-04-18 LAB — CERVICOVAGINAL ANCILLARY ONLY
Bacterial vaginitis: POSITIVE — AB
Candida vaginitis: POSITIVE — AB
Chlamydia: NEGATIVE
Neisseria Gonorrhea: NEGATIVE
Trichomonas: NEGATIVE

## 2019-04-18 LAB — RPR: RPR Ser Ql: NONREACTIVE

## 2019-04-18 LAB — HIV ANTIBODY (ROUTINE TESTING W REFLEX): HIV Screen 4th Generation wRfx: NONREACTIVE

## 2019-04-19 ENCOUNTER — Other Ambulatory Visit: Payer: Self-pay | Admitting: Obstetrics

## 2019-04-19 DIAGNOSIS — N760 Acute vaginitis: Secondary | ICD-10-CM

## 2019-04-19 DIAGNOSIS — B3731 Acute candidiasis of vulva and vagina: Secondary | ICD-10-CM

## 2019-04-19 DIAGNOSIS — B373 Candidiasis of vulva and vagina: Secondary | ICD-10-CM

## 2019-04-19 DIAGNOSIS — B9689 Other specified bacterial agents as the cause of diseases classified elsewhere: Secondary | ICD-10-CM

## 2019-04-19 MED ORDER — TINIDAZOLE 500 MG PO TABS: 1000.0000 mg | ORAL_TABLET | Freq: Every day | ORAL | 2 refills | Status: DC

## 2019-04-19 MED ORDER — FLUCONAZOLE 150 MG PO TABS: 150.0000 mg | ORAL_TABLET | Freq: Once | ORAL | 0 refills | Status: AC

## 2019-05-01 ENCOUNTER — Ambulatory Visit (INDEPENDENT_AMBULATORY_CARE_PROVIDER_SITE_OTHER): Payer: Medicaid Other

## 2019-05-01 ENCOUNTER — Other Ambulatory Visit: Payer: Self-pay

## 2019-05-01 VITALS — BP 111/69 | HR 78 | Ht 65.0 in | Wt 162.0 lb

## 2019-05-01 DIAGNOSIS — Z3042 Encounter for surveillance of injectable contraceptive: Secondary | ICD-10-CM | POA: Diagnosis not present

## 2019-05-01 MED ORDER — MEDROXYPROGESTERONE ACETATE 150 MG/ML IM SUSP
150.0000 mg | Freq: Once | INTRAMUSCULAR | Status: AC
  Administered 2019-05-01: 150 mg via INTRAMUSCULAR

## 2019-05-01 NOTE — Progress Notes (Signed)
Presented for DEPO Injection, given in RUOQ, tolerated well.  Next DEPO Sept. 9-23/2020  Administrations This Visit    medroxyPROGESTERone (DEPO-PROVERA) injection 150 mg    Admin Date 05/01/2019 Action Given Dose 150 mg Route Intramuscular Administered By Tamela Oddi, RMA

## 2019-07-24 ENCOUNTER — Ambulatory Visit: Payer: Medicaid Other

## 2019-07-29 ENCOUNTER — Ambulatory Visit (INDEPENDENT_AMBULATORY_CARE_PROVIDER_SITE_OTHER): Payer: Medicaid Other | Admitting: *Deleted

## 2019-07-29 ENCOUNTER — Other Ambulatory Visit: Payer: Self-pay

## 2019-07-29 ENCOUNTER — Other Ambulatory Visit (HOSPITAL_COMMUNITY)
Admission: RE | Admit: 2019-07-29 | Discharge: 2019-07-29 | Disposition: A | Discharge status: Home / Self Care | Payer: Medicaid Other | Source: Ambulatory Visit | Attending: Obstetrics | Admitting: Obstetrics

## 2019-07-29 ENCOUNTER — Other Ambulatory Visit: Payer: Self-pay | Admitting: Obstetrics

## 2019-07-29 DIAGNOSIS — Z113 Encounter for screening for infections with a predominantly sexual mode of transmission: Secondary | ICD-10-CM | POA: Diagnosis present

## 2019-07-29 DIAGNOSIS — Z3042 Encounter for surveillance of injectable contraceptive: Secondary | ICD-10-CM | POA: Diagnosis not present

## 2019-07-29 MED ORDER — MEDROXYPROGESTERONE ACETATE 150 MG/ML IM SUSP
150.0000 mg | INTRAMUSCULAR | Status: AC
  Administered 2019-07-29 – 2024-05-16 (×9): 150 mg via INTRAMUSCULAR

## 2019-07-29 NOTE — Progress Notes (Signed)
Pt is in office for Depo injection.  Pt supplied and is on time for Depo. Injection given, pt tolerated well. Pt also request Self Swab today.  Pt instructed on collection and preformed self swab.  To be sent today for complete testing.  Pt states no symptoms but wants to be 'checked' today.  Pt made aware to RTO 12/7-12/21 for next Depo.  Administrations This Visit    medroxyPROGESTERone (DEPO-PROVERA) injection 150 mg    Admin Date 07/29/2019 Action Given Dose 150 mg Route Intramuscular Administered By Valene Bors, CMA

## 2019-07-30 ENCOUNTER — Other Ambulatory Visit: Payer: Self-pay | Admitting: Obstetrics

## 2019-07-30 DIAGNOSIS — N760 Acute vaginitis: Secondary | ICD-10-CM

## 2019-07-30 DIAGNOSIS — B9689 Other specified bacterial agents as the cause of diseases classified elsewhere: Secondary | ICD-10-CM

## 2019-07-30 LAB — CERVICOVAGINAL ANCILLARY ONLY
Bacterial Vaginitis (gardnerella): POSITIVE — AB
Candida Glabrata: NEGATIVE
Candida Vaginitis: NEGATIVE
Molecular Disclaimer: NEGATIVE
Molecular Disclaimer: NEGATIVE
Molecular Disclaimer: NEGATIVE
Molecular Disclaimer: NORMAL
Trichomonas: NEGATIVE

## 2019-07-30 MED ORDER — TINIDAZOLE 500 MG PO TABS: 1000.0000 mg | ORAL_TABLET | Freq: Every day | ORAL | 2 refills | Status: DC

## 2019-07-31 LAB — CERVICOVAGINAL ANCILLARY ONLY
Chlamydia: NEGATIVE
Neisseria Gonorrhea: NEGATIVE

## 2019-09-11 ENCOUNTER — Other Ambulatory Visit: Payer: Self-pay

## 2019-09-11 DIAGNOSIS — Z20822 Contact with and (suspected) exposure to covid-19: Secondary | ICD-10-CM

## 2019-09-12 LAB — NOVEL CORONAVIRUS, NAA: SARS-CoV-2, NAA: NOT DETECTED

## 2019-10-21 ENCOUNTER — Ambulatory Visit: Payer: Medicaid Other

## 2019-10-25 ENCOUNTER — Ambulatory Visit: Payer: Medicaid Other

## 2019-10-29 ENCOUNTER — Ambulatory Visit (INDEPENDENT_AMBULATORY_CARE_PROVIDER_SITE_OTHER): Payer: Medicaid Other

## 2019-10-29 ENCOUNTER — Other Ambulatory Visit: Payer: Self-pay

## 2019-10-29 DIAGNOSIS — Z3042 Encounter for surveillance of injectable contraceptive: Secondary | ICD-10-CM

## 2019-10-29 NOTE — Progress Notes (Signed)
Pt is in the office for depo injection, administered in RUOQ and pt tolerated well. Next due Mar 9-23 .Marland Kitchen Administrations This Visit    medroxyPROGESTERone (DEPO-PROVERA) injection 150 mg    Admin Date 10/29/2019 Action Given Dose 150 mg Route Intramuscular Administered By Hinton Lovely, RN

## 2019-10-29 NOTE — Progress Notes (Signed)
I have reviewed this chart and agree with the RN/CMA assessment and management.    K. Meryl Saralyn Willison, M.D. Attending [facility] (Faculty Practice)   

## 2019-12-02 ENCOUNTER — Ambulatory Visit: Payer: Medicaid Other | Attending: Internal Medicine

## 2019-12-02 DIAGNOSIS — Z20822 Contact with and (suspected) exposure to covid-19: Secondary | ICD-10-CM

## 2019-12-03 LAB — NOVEL CORONAVIRUS, NAA: SARS-CoV-2, NAA: NOT DETECTED

## 2020-01-15 ENCOUNTER — Ambulatory Visit: Payer: Medicaid Other | Attending: Internal Medicine

## 2020-01-15 ENCOUNTER — Other Ambulatory Visit: Payer: Self-pay

## 2020-01-15 ENCOUNTER — Encounter: Payer: Self-pay | Admitting: Obstetrics

## 2020-01-15 ENCOUNTER — Ambulatory Visit: Payer: Medicaid Other | Admitting: Obstetrics

## 2020-01-15 ENCOUNTER — Other Ambulatory Visit (HOSPITAL_COMMUNITY)
Admission: RE | Admit: 2020-01-15 | Discharge: 2020-01-15 | Disposition: A | Discharge status: Home / Self Care | Payer: Medicaid Other | Source: Ambulatory Visit | Attending: Obstetrics | Admitting: Obstetrics

## 2020-01-15 VITALS — BP 132/89 | HR 68 | Wt 166.0 lb

## 2020-01-15 DIAGNOSIS — Z113 Encounter for screening for infections with a predominantly sexual mode of transmission: Secondary | ICD-10-CM

## 2020-01-15 DIAGNOSIS — Z0000 Encounter for general adult medical examination without abnormal findings: Secondary | ICD-10-CM

## 2020-01-15 DIAGNOSIS — Z01419 Encounter for gynecological examination (general) (routine) without abnormal findings: Secondary | ICD-10-CM | POA: Diagnosis present

## 2020-01-15 DIAGNOSIS — R5382 Chronic fatigue, unspecified: Secondary | ICD-10-CM

## 2020-01-15 DIAGNOSIS — Z3042 Encounter for surveillance of injectable contraceptive: Secondary | ICD-10-CM | POA: Diagnosis not present

## 2020-01-15 DIAGNOSIS — N898 Other specified noninflammatory disorders of vagina: Secondary | ICD-10-CM

## 2020-01-15 DIAGNOSIS — Z20822 Contact with and (suspected) exposure to covid-19: Secondary | ICD-10-CM

## 2020-01-15 MED ORDER — VITAFOL ULTRA 29-0.6-0.4-200 MG PO CAPS: 1.0000 | ORAL_CAPSULE | Freq: Every day | ORAL | 11 refills | Status: DC

## 2020-01-15 NOTE — Progress Notes (Signed)
Depo given at today's visit.  Pt tolerated injection well in LUOQ. Pt advised to return for next depo.    Administrations This Visit    medroxyPROGESTERone (DEPO-PROVERA) injection 150 mg    Admin Date 01/15/2020 Action Given Dose 150 mg Route Intramuscular Administered By Lanney Gins, CMA

## 2020-01-15 NOTE — Progress Notes (Signed)
Pt states she is tired a lot.  Unsure if it is related to Depo injection or not.
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
## 2020-01-16 LAB — CBC WITH DIFFERENTIAL/PLATELET
Basophils Absolute: 0 10*3/uL (ref 0.0–0.2)
Basos: 1 %
EOS (ABSOLUTE): 0 10*3/uL (ref 0.0–0.4)
Eos: 0 %
Hematocrit: 38.8 % (ref 34.0–46.6)
Hemoglobin: 12.6 g/dL (ref 11.1–15.9)
Immature Grans (Abs): 0 10*3/uL (ref 0.0–0.1)
Immature Granulocytes: 0 %
Lymphocytes Absolute: 1.5 10*3/uL (ref 0.7–3.1)
Lymphs: 50 %
MCH: 27 pg (ref 26.6–33.0)
MCHC: 32.5 g/dL (ref 31.5–35.7)
MCV: 83 fL (ref 79–97)
Monocytes Absolute: 0.4 10*3/uL (ref 0.1–0.9)
Monocytes: 12 %
Neutrophils Absolute: 1.1 10*3/uL — ABNORMAL LOW (ref 1.4–7.0)
Neutrophils: 37 %
Platelets: 241 10*3/uL (ref 150–450)
RBC: 4.67 x10E6/uL (ref 3.77–5.28)
RDW: 13 % (ref 11.7–15.4)
WBC: 3 10*3/uL — ABNORMAL LOW (ref 3.4–10.8)

## 2020-01-16 LAB — CERVICOVAGINAL ANCILLARY ONLY
Bacterial Vaginitis (gardnerella): NEGATIVE
Candida Glabrata: NEGATIVE
Candida Vaginitis: NEGATIVE
Chlamydia: NEGATIVE
Comment: NEGATIVE
Comment: NEGATIVE
Comment: NEGATIVE
Comment: NEGATIVE
Comment: NEGATIVE
Comment: NORMAL
Neisseria Gonorrhea: NEGATIVE
Trichomonas: NEGATIVE

## 2020-01-16 LAB — RPR: RPR Ser Ql: NONREACTIVE

## 2020-01-16 LAB — COMPREHENSIVE METABOLIC PANEL
ALT: 10 IU/L (ref 0–32)
AST: 19 IU/L (ref 0–40)
Albumin/Globulin Ratio: 1.5 (ref 1.2–2.2)
Albumin: 4.3 g/dL (ref 3.9–5.0)
Alkaline Phosphatase: 98 IU/L (ref 39–117)
BUN/Creatinine Ratio: 8 — ABNORMAL LOW (ref 9–23)
BUN: 9 mg/dL (ref 6–20)
Bilirubin Total: 0.3 mg/dL (ref 0.0–1.2)
CO2: 22 mmol/L (ref 20–29)
Calcium: 9 mg/dL (ref 8.7–10.2)
Chloride: 108 mmol/L — ABNORMAL HIGH (ref 96–106)
Creatinine, Ser: 1.08 mg/dL — ABNORMAL HIGH (ref 0.57–1.00)
GFR calc Af Amer: 82 mL/min/{1.73_m2} (ref >59)
GFR calc non Af Amer: 71 mL/min/{1.73_m2} (ref >59)
Globulin, Total: 2.9 g/dL (ref 1.5–4.5)
Glucose: 85 mg/dL (ref 65–99)
Potassium: 3.9 mmol/L (ref 3.5–5.2)
Sodium: 141 mmol/L (ref 134–144)
Total Protein: 7.2 g/dL (ref 6.0–8.5)

## 2020-01-16 LAB — HEPATITIS C ANTIBODY: Hep C Virus Ab: <0.1 s/co ratio (ref 0.0–0.9)

## 2020-01-16 LAB — TSH: TSH: 0.501 u[IU]/mL (ref 0.450–4.500)

## 2020-01-16 LAB — HIV ANTIBODY (ROUTINE TESTING W REFLEX): HIV Screen 4th Generation wRfx: NONREACTIVE

## 2020-01-16 LAB — HEPATITIS B SURFACE ANTIGEN: Hepatitis B Surface Ag: NEGATIVE

## 2020-01-16 LAB — NOVEL CORONAVIRUS, NAA: SARS-CoV-2, NAA: NOT DETECTED

## 2020-01-17 LAB — CYTOLOGY - PAP

## 2020-01-21 ENCOUNTER — Ambulatory Visit: Payer: Medicaid Other

## 2020-03-17 ENCOUNTER — Ambulatory Visit: Payer: Medicaid Other | Admitting: Obstetrics

## 2020-03-24 ENCOUNTER — Encounter (HOSPITAL_COMMUNITY): Payer: Self-pay | Admitting: Emergency Medicine

## 2020-03-24 ENCOUNTER — Other Ambulatory Visit: Payer: Self-pay

## 2020-03-24 ENCOUNTER — Emergency Department (HOSPITAL_COMMUNITY)
Admission: EM | Admit: 2020-03-24 | Discharge: 2020-03-24 | Disposition: A | Discharge status: Home / Self Care | Payer: Medicaid Other | Attending: Emergency Medicine | Admitting: Emergency Medicine

## 2020-03-24 DIAGNOSIS — K0889 Other specified disorders of teeth and supporting structures: Secondary | ICD-10-CM | POA: Insufficient documentation

## 2020-03-24 DIAGNOSIS — Z79899 Other long term (current) drug therapy: Secondary | ICD-10-CM | POA: Diagnosis not present

## 2020-03-24 MED ORDER — AMOXICILLIN 500 MG PO CAPS: 500.0000 mg | ORAL_CAPSULE | Freq: Three times a day (TID) | ORAL | 0 refills | Status: DC

## 2020-03-24 MED ORDER — NAPROXEN 500 MG PO TABS: 500.0000 mg | ORAL_TABLET | Freq: Two times a day (BID) | ORAL | 0 refills | Status: DC | PRN

## 2020-03-24 NOTE — ED Triage Notes (Signed)
Pt reports she has lower left dental pain that started today.  OTC medications are not helping to relieve the pain.

## 2020-03-24 NOTE — Discharge Instructions (Signed)
Call your dentist for appointment later this week. Take pain medicines and antibiotics as prescribed. Return for worsening swelling, breathing difficulty or new concerns. You can take tylenol 1000 mg 4 times a day and naproxen as directed for pain Thank you
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
## 2020-03-25 ENCOUNTER — Ambulatory Visit: Payer: Medicaid Other | Admitting: Obstetrics

## 2020-03-26 ENCOUNTER — Other Ambulatory Visit (HOSPITAL_COMMUNITY)
Admission: RE | Admit: 2020-03-26 | Discharge: 2020-03-26 | Disposition: A | Discharge status: Home / Self Care | Payer: Medicaid Other | Source: Ambulatory Visit | Attending: Obstetrics | Admitting: Obstetrics

## 2020-03-26 ENCOUNTER — Ambulatory Visit (INDEPENDENT_AMBULATORY_CARE_PROVIDER_SITE_OTHER): Payer: Medicaid Other

## 2020-03-26 ENCOUNTER — Other Ambulatory Visit: Payer: Self-pay

## 2020-03-26 VITALS — BP 124/74 | HR 71 | Wt 169.0 lb

## 2020-03-26 DIAGNOSIS — B373 Candidiasis of vulva and vagina: Secondary | ICD-10-CM | POA: Diagnosis not present

## 2020-03-26 DIAGNOSIS — Z113 Encounter for screening for infections with a predominantly sexual mode of transmission: Secondary | ICD-10-CM | POA: Diagnosis present

## 2020-03-26 NOTE — Progress Notes (Signed)
SUBJECTIVE:  29 y.o. female presents for STD Tests. Denies vaginal discharge, odor, abnormal vaginal bleeding or significant pelvic pain or fever. No UTI symptoms. Denies history of known exposure to STD stated that she just wants to be checked and does so every 2 months.  No LMP recorded. Patient has had an injection.  OBJECTIVE:  She appears well, afebrile. Urine dipstick: not done.  ASSESSMENT:  STD Checks because she does them every other month.  Denies sx.   PLAN:  GC, chlamydia, trichomonas, BVAG, CVAG probe sent to lab.  Treatment: To be determined once lab results are received ROV prn if symptoms persist or worsen.

## 2020-03-26 NOTE — Progress Notes (Signed)
Patient was assessed and managed by nursing staff during this encounter. I have reviewed the chart and agree with the documentation and plan. I have also made any necessary editorial changes.  Cherre Robins, CNM 03/26/2020 4:10 PM

## 2020-03-27 LAB — CERVICOVAGINAL ANCILLARY ONLY
Bacterial Vaginitis (gardnerella): NEGATIVE
Candida Glabrata: NEGATIVE
Candida Vaginitis: POSITIVE — AB
Chlamydia: NEGATIVE
Comment: NEGATIVE
Comment: NEGATIVE
Comment: NEGATIVE
Comment: NEGATIVE
Comment: NEGATIVE
Comment: NORMAL
Neisseria Gonorrhea: NEGATIVE
Trichomonas: NEGATIVE

## 2020-03-28 ENCOUNTER — Other Ambulatory Visit: Payer: Self-pay

## 2020-03-28 DIAGNOSIS — B3731 Acute candidiasis of vulva and vagina: Secondary | ICD-10-CM

## 2020-03-28 DIAGNOSIS — B373 Candidiasis of vulva and vagina: Secondary | ICD-10-CM

## 2020-03-28 MED ORDER — FLUCONAZOLE 150 MG PO TABS: 150.0000 mg | ORAL_TABLET | Freq: Every day | ORAL | 0 refills | Status: DC

## 2020-03-30 ENCOUNTER — Emergency Department (HOSPITAL_COMMUNITY): Payer: Medicaid Other

## 2020-03-30 ENCOUNTER — Other Ambulatory Visit: Payer: Self-pay

## 2020-03-30 ENCOUNTER — Emergency Department (HOSPITAL_COMMUNITY)
Admission: EM | Admit: 2020-03-30 | Discharge: 2020-03-30 | Disposition: A | Discharge status: Home / Self Care | Payer: Medicaid Other | Attending: Emergency Medicine | Admitting: Emergency Medicine

## 2020-03-30 DIAGNOSIS — R079 Chest pain, unspecified: Secondary | ICD-10-CM

## 2020-03-30 DIAGNOSIS — Z79899 Other long term (current) drug therapy: Secondary | ICD-10-CM | POA: Diagnosis not present

## 2020-03-30 DIAGNOSIS — J45909 Unspecified asthma, uncomplicated: Secondary | ICD-10-CM | POA: Insufficient documentation

## 2020-03-30 DIAGNOSIS — R072 Precordial pain: Secondary | ICD-10-CM | POA: Insufficient documentation

## 2020-03-30 LAB — BASIC METABOLIC PANEL
Anion gap: 9 (ref 5–15)
BUN: 15 mg/dL (ref 6–20)
CO2: 23 mmol/L (ref 22–32)
Calcium: 8.8 mg/dL — ABNORMAL LOW (ref 8.9–10.3)
Chloride: 105 mmol/L (ref 98–111)
Creatinine, Ser: 1.11 mg/dL — ABNORMAL HIGH (ref 0.44–1.00)
GFR calc Af Amer: >60 mL/min (ref >60)
GFR calc non Af Amer: >60 mL/min (ref >60)
Glucose, Bld: 90 mg/dL (ref 70–99)
Potassium: 3.9 mmol/L (ref 3.5–5.1)
Sodium: 137 mmol/L (ref 135–145)

## 2020-03-30 LAB — TROPONIN I (HIGH SENSITIVITY)
Troponin I (High Sensitivity): <2 ng/L (ref <18)
Troponin I (High Sensitivity): 3 ng/L (ref <18)

## 2020-03-30 LAB — CBC
HCT: 39 % (ref 36.0–46.0)
Hemoglobin: 12.4 g/dL (ref 12.0–15.0)
MCH: 26.8 pg (ref 26.0–34.0)
MCHC: 31.8 g/dL (ref 30.0–36.0)
MCV: 84.4 fL (ref 80.0–100.0)
Platelets: 211 10*3/uL (ref 150–400)
RBC: 4.62 MIL/uL (ref 3.87–5.11)
RDW: 12.6 % (ref 11.5–15.5)
WBC: 5 10*3/uL (ref 4.0–10.5)
nRBC: 0 % (ref 0.0–0.2)

## 2020-03-30 LAB — MAGNESIUM: Magnesium: 1.6 mg/dL — ABNORMAL LOW (ref 1.7–2.4)

## 2020-03-30 LAB — I-STAT BETA HCG BLOOD, ED (MC, WL, AP ONLY): I-stat hCG, quantitative: <5 m[IU]/mL (ref <5)

## 2020-03-30 MED ORDER — SODIUM CHLORIDE 0.9% FLUSH: 3.0000 mL | Freq: Once | INTRAVENOUS | Status: DC

## 2020-03-30 MED ORDER — LIDOCAINE VISCOUS HCL 2 % MT SOLN
15.0000 mL | Freq: Once | OROMUCOSAL | Status: AC
  Administered 2020-03-30: 15 mL via ORAL
  Filled 2020-03-30: qty 15

## 2020-03-30 MED ORDER — ALUM & MAG HYDROXIDE-SIMETH 200-200-20 MG/5ML PO SUSP
30.0000 mL | Freq: Once | ORAL | Status: AC
  Administered 2020-03-30: 30 mL via ORAL
  Filled 2020-03-30: qty 30

## 2020-03-30 MED ORDER — MAGNESIUM CHLORIDE 64 MG PO TBEC
2.0000 | DELAYED_RELEASE_TABLET | Freq: Once | ORAL | Status: AC
  Administered 2020-03-30: 128 mg via ORAL
  Filled 2020-03-30: qty 2

## 2020-03-30 MED ORDER — OMEPRAZOLE 20 MG PO CPDR
20.0000 mg | DELAYED_RELEASE_CAPSULE | Freq: Every day | ORAL | 0 refills | Status: DC
Start: 2020-03-30 — End: 2020-05-01

## 2020-03-30 NOTE — ED Notes (Signed)
Patient verbalizes understanding of discharge instructions . Opportunity for questions and answers were provided . Armband removed by staff ,Pt discharged from ED. W/C  offered at D/C  and Declined W/C at D/C and was escorted to lobby by RN.  

## 2020-03-30 NOTE — ED Notes (Signed)
PT reports vomiting up the GI cockail. PT denies CP
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
## 2020-03-30 NOTE — ED Notes (Signed)
Pt reports she has worn a heart monitor in the past because of syncope  . Pt reports no diagnosis was given after wearing the heart mon.

## 2020-03-30 NOTE — ED Triage Notes (Signed)
Pt endorses central chest aching since last night at 6pm. Thought initially that it was heart burn (hx of same) but pain worsening at 0400 when she woke up this morning. Denies shob, n/v, or dizziness.

## 2020-03-30 NOTE — ED Notes (Signed)
Pt called this writer to her room bercause she had sharp chest pain. Pt reports it comes and goes. This was reported to EDP.

## 2020-03-30 NOTE — Discharge Instructions (Signed)
You were seen in the emergency department for evaluation of chest pain.  You had blood work EKG and a chest x-ray that did not show any evidence of cardiac injury.  You did have an abnormal rhythm which improved while you were here.  We are starting you on some acid medication as this may be a cause of your pain.  If you experience continued chest pain or more frequent fainting spells please contact cardiology for follow-up.  Return to the emergency department if any worsening or concerning symptoms.

## 2020-04-07 ENCOUNTER — Other Ambulatory Visit: Payer: Self-pay

## 2020-04-07 ENCOUNTER — Ambulatory Visit: Payer: Medicaid Other

## 2020-04-07 DIAGNOSIS — Z3042 Encounter for surveillance of injectable contraceptive: Secondary | ICD-10-CM

## 2020-04-07 MED ORDER — MEDROXYPROGESTERONE ACETATE 150 MG/ML IM SUSP: 150.0000 mg | INTRAMUSCULAR | 4 refills | Status: DC

## 2020-04-13 ENCOUNTER — Ambulatory Visit (INDEPENDENT_AMBULATORY_CARE_PROVIDER_SITE_OTHER): Payer: Medicaid Other

## 2020-04-13 ENCOUNTER — Other Ambulatory Visit: Payer: Self-pay

## 2020-04-13 VITALS — BP 125/78 | HR 72

## 2020-04-13 DIAGNOSIS — Z3042 Encounter for surveillance of injectable contraceptive: Secondary | ICD-10-CM | POA: Diagnosis not present

## 2020-04-13 NOTE — Progress Notes (Signed)
Date last pap: 01/15/2020 Last Depo-Provera  Side Effects if any: No side effects to report at this time. Serum HCG indicated? Not indicted  Depo-Provera 150 mg IM given by D.Cheree Ditto in the left upper gluteal. Next appointment due:07/05/2020

## 2020-04-23 ENCOUNTER — Ambulatory Visit: Payer: Medicaid Other

## 2020-05-01 ENCOUNTER — Ambulatory Visit (HOSPITAL_COMMUNITY)
Admission: EM | Admit: 2020-05-01 | Discharge: 2020-05-01 | Disposition: A | Discharge status: Home / Self Care | Payer: Medicaid Other | Attending: Emergency Medicine | Admitting: Emergency Medicine

## 2020-05-01 ENCOUNTER — Other Ambulatory Visit: Payer: Self-pay

## 2020-05-01 ENCOUNTER — Encounter (HOSPITAL_COMMUNITY): Payer: Self-pay

## 2020-05-01 ENCOUNTER — Ambulatory Visit: Payer: Medicaid Other

## 2020-05-01 DIAGNOSIS — N760 Acute vaginitis: Secondary | ICD-10-CM | POA: Diagnosis present

## 2020-05-01 DIAGNOSIS — B9689 Other specified bacterial agents as the cause of diseases classified elsewhere: Secondary | ICD-10-CM | POA: Diagnosis present

## 2020-05-01 MED ORDER — METRONIDAZOLE 500 MG PO TABS: 500.0000 mg | ORAL_TABLET | Freq: Two times a day (BID) | ORAL | 0 refills | Status: AC

## 2020-05-01 NOTE — Discharge Instructions (Signed)
Begin metronidazole twice daily for 1 week to treat for bacterial vaginosis.  No alcohol until 24 hours after last tablet.  We are testing you for Gonorrhea, Chlamydia, Trichomonas, Yeast and Bacterial Vaginosis. We will call you if anything is positive and let you know if you require any further treatment. Please inform partners of any positive results.   Please return if symptoms not improving with treatment, development of fever, nausea, vomiting, abdominal pain.

## 2020-05-01 NOTE — ED Triage Notes (Signed)
Pt reports white vaginal discharge x 1 week. Denies itchiness or other symptoms.
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
## 2020-05-04 LAB — CERVICOVAGINAL ANCILLARY ONLY
Bacterial Vaginitis (gardnerella): NEGATIVE
Candida Glabrata: NEGATIVE
Candida Vaginitis: NEGATIVE
Chlamydia: NEGATIVE
Comment: NEGATIVE
Comment: NEGATIVE
Comment: NEGATIVE
Comment: NEGATIVE
Comment: NEGATIVE
Comment: NORMAL
Neisseria Gonorrhea: NEGATIVE
Trichomonas: NEGATIVE

## 2020-06-11 ENCOUNTER — Other Ambulatory Visit: Payer: Self-pay

## 2020-06-11 ENCOUNTER — Ambulatory Visit (INDEPENDENT_AMBULATORY_CARE_PROVIDER_SITE_OTHER): Payer: Medicaid Other

## 2020-06-11 ENCOUNTER — Other Ambulatory Visit (HOSPITAL_COMMUNITY)
Admission: RE | Admit: 2020-06-11 | Discharge: 2020-06-11 | Disposition: A | Discharge status: Home / Self Care | Payer: Medicaid Other | Source: Ambulatory Visit | Attending: Obstetrics | Admitting: Obstetrics

## 2020-06-11 VITALS — BP 109/75 | HR 93 | Ht 64.0 in | Wt 174.0 lb

## 2020-06-11 DIAGNOSIS — R300 Dysuria: Secondary | ICD-10-CM

## 2020-06-11 DIAGNOSIS — Z113 Encounter for screening for infections with a predominantly sexual mode of transmission: Secondary | ICD-10-CM | POA: Insufficient documentation

## 2020-06-11 LAB — POCT URINALYSIS DIPSTICK
Blood, UA: NEGATIVE
Glucose, UA: NEGATIVE
Nitrite, UA: NEGATIVE
Protein, UA: POSITIVE — AB
Spec Grav, UA: 1.025 (ref 1.010–1.025)
Urobilinogen, UA: 0.2 E.U./dL
pH, UA: 5 (ref 5.0–8.0)

## 2020-06-11 NOTE — Progress Notes (Signed)
SUBJECTIVE:  29 y.o. female complains of white vaginal discharge, and Dysuria for 4 day(s). Denies abnormal vaginal bleeding or significant pelvic pain or fever. Denies history of known exposure to STD. Wants STD Screening.  No LMP recorded. Patient has had an injection.  OBJECTIVE:  She appears well, afebrile. Urine dipstick: positive for protein, positive for leukocytes and positive for ketones.  ASSESSMENT:  Vaginal Discharge  Vaginal Odor   PLAN:  GC, chlamydia, trichomonas, BVAG, CVAG probe, urine culture sent to lab. Treatment: To be determined once lab results are received ROV prn if symptoms persist or worsen.

## 2020-06-13 LAB — URINE CULTURE

## 2020-06-14 LAB — CERVICOVAGINAL ANCILLARY ONLY
Bacterial Vaginitis (gardnerella): NEGATIVE
Candida Glabrata: NEGATIVE
Candida Vaginitis: NEGATIVE
Chlamydia: NEGATIVE
Comment: NEGATIVE
Comment: NEGATIVE
Comment: NEGATIVE
Comment: NEGATIVE
Comment: NEGATIVE
Comment: NORMAL
Neisseria Gonorrhea: NEGATIVE
Trichomonas: NEGATIVE

## 2020-07-10 ENCOUNTER — Other Ambulatory Visit: Payer: Self-pay

## 2020-07-10 ENCOUNTER — Ambulatory Visit (INDEPENDENT_AMBULATORY_CARE_PROVIDER_SITE_OTHER): Payer: Medicaid Other

## 2020-07-10 VITALS — Wt 178.4 lb

## 2020-07-10 DIAGNOSIS — Z3042 Encounter for surveillance of injectable contraceptive: Secondary | ICD-10-CM

## 2020-07-10 NOTE — Progress Notes (Signed)
Pt is in the office for depo injection, administered in RUOQ and pt tolerated well. Next due Nov 19- Dec 3 .. Administrations This Visit    medroxyPROGESTERone (DEPO-PROVERA) injection 150 mg    Admin Date 07/10/2020 Action Given Dose 150 mg Route Intramuscular Administered By Katrina Stack, RN

## 2020-07-11 NOTE — Progress Notes (Signed)
Agree with A & P. 

## 2020-07-27 ENCOUNTER — Ambulatory Visit: Payer: Medicaid Other

## 2020-08-06 ENCOUNTER — Other Ambulatory Visit: Payer: Self-pay

## 2020-08-06 ENCOUNTER — Other Ambulatory Visit (HOSPITAL_COMMUNITY)
Admission: RE | Admit: 2020-08-06 | Discharge: 2020-08-06 | Disposition: A | Discharge status: Home / Self Care | Payer: Medicaid Other | Source: Ambulatory Visit | Attending: Obstetrics and Gynecology | Admitting: Obstetrics and Gynecology

## 2020-08-06 ENCOUNTER — Ambulatory Visit (INDEPENDENT_AMBULATORY_CARE_PROVIDER_SITE_OTHER): Payer: Medicaid Other

## 2020-08-06 VITALS — BP 122/87 | HR 76 | Wt 176.0 lb

## 2020-08-06 DIAGNOSIS — Z113 Encounter for screening for infections with a predominantly sexual mode of transmission: Secondary | ICD-10-CM | POA: Insufficient documentation

## 2020-08-06 NOTE — Progress Notes (Signed)
SUBJECTIVE:  29 y.o. female presents for STD/HIV Screening Denies abnormal vaginal discharge, bleeding or significant pelvic pain or fever. No UTI symptoms. Denies history of known exposure to STD.  No LMP recorded. Patient has had an injection.  OBJECTIVE:  She appears well, afebrile. Urine dipstick: not done.  ASSESSMENT:  Patient wants to be screened for STD, stated she gets screened every few months.     PLAN:  GC, chlamydia, trichomonas, BVAG, CVAG probe and HIV sent to lab.  Treatment: To be determined once lab results are received ROV prn if symptoms persist or worsen.

## 2020-08-07 LAB — CERVICOVAGINAL ANCILLARY ONLY
Bacterial Vaginitis (gardnerella): POSITIVE — AB
Candida Glabrata: NEGATIVE
Candida Vaginitis: NEGATIVE
Chlamydia: NEGATIVE
Comment: NEGATIVE
Comment: NEGATIVE
Comment: NEGATIVE
Comment: NEGATIVE
Comment: NEGATIVE
Comment: NORMAL
Neisseria Gonorrhea: NEGATIVE
Trichomonas: NEGATIVE

## 2020-08-07 LAB — HIV ANTIBODY (ROUTINE TESTING W REFLEX): HIV Screen 4th Generation wRfx: NONREACTIVE

## 2020-08-08 ENCOUNTER — Other Ambulatory Visit: Payer: Self-pay | Admitting: Obstetrics

## 2020-08-08 DIAGNOSIS — N760 Acute vaginitis: Secondary | ICD-10-CM

## 2020-08-08 DIAGNOSIS — B9689 Other specified bacterial agents as the cause of diseases classified elsewhere: Secondary | ICD-10-CM

## 2020-08-08 MED ORDER — METRONIDAZOLE 500 MG PO TABS: 500.0000 mg | ORAL_TABLET | Freq: Two times a day (BID) | ORAL | 2 refills | Status: DC

## 2020-08-10 ENCOUNTER — Other Ambulatory Visit: Payer: Self-pay

## 2020-09-17 ENCOUNTER — Other Ambulatory Visit (HOSPITAL_COMMUNITY)
Admission: RE | Admit: 2020-09-17 | Discharge: 2020-09-17 | Disposition: A | Discharge status: Home / Self Care | Payer: Medicaid Other | Source: Ambulatory Visit | Attending: Obstetrics & Gynecology | Admitting: Obstetrics & Gynecology

## 2020-09-17 ENCOUNTER — Other Ambulatory Visit: Payer: Self-pay

## 2020-09-17 ENCOUNTER — Ambulatory Visit (INDEPENDENT_AMBULATORY_CARE_PROVIDER_SITE_OTHER): Payer: Medicaid Other

## 2020-09-17 VITALS — BP 118/80 | HR 72 | Ht 64.0 in | Wt 175.0 lb

## 2020-09-17 DIAGNOSIS — Z113 Encounter for screening for infections with a predominantly sexual mode of transmission: Secondary | ICD-10-CM | POA: Insufficient documentation

## 2020-09-17 NOTE — Progress Notes (Signed)
I reviewed the nurses note and agree with the plan of care.   [name], MD 02/02/2018 10:46 AM  

## 2020-09-17 NOTE — Progress Notes (Signed)
SUBJECTIVE:  29 y.o. female GYN requesting STD Screening. Denies abnormal vaginal discharge, bleeding or significant pelvic pain or fever. No UTI symptoms. Denies history of known exposure to STD.  No LMP recorded. Patient has had an injection.  OBJECTIVE:  She appears well, afebrile. Urine dipstick: not done.  ASSESSMENT:  Patient wants routine STD Screening   PLAN:  GC, chlamydia, trichomonas, BVAG, CVAG probe sent to lab. Treatment: To be determined once lab results are received ROV prn if symptoms persist or worsen.

## 2020-09-18 LAB — CERVICOVAGINAL ANCILLARY ONLY
Bacterial Vaginitis (gardnerella): NEGATIVE
Candida Glabrata: NEGATIVE
Candida Vaginitis: POSITIVE — AB
Chlamydia: NEGATIVE
Comment: NEGATIVE
Comment: NEGATIVE
Comment: NEGATIVE
Comment: NEGATIVE
Comment: NEGATIVE
Comment: NORMAL
Neisseria Gonorrhea: NEGATIVE
Trichomonas: NEGATIVE

## 2020-09-19 ENCOUNTER — Other Ambulatory Visit: Payer: Self-pay | Admitting: Obstetrics

## 2020-09-19 DIAGNOSIS — B373 Candidiasis of vulva and vagina: Secondary | ICD-10-CM

## 2020-09-19 DIAGNOSIS — B3731 Acute candidiasis of vulva and vagina: Secondary | ICD-10-CM

## 2020-09-19 MED ORDER — FLUCONAZOLE 150 MG PO TABS: 150.0000 mg | ORAL_TABLET | Freq: Once | ORAL | 0 refills | Status: AC

## 2020-09-21 ENCOUNTER — Telehealth: Payer: Self-pay

## 2020-09-21 NOTE — Telephone Encounter (Signed)
Called patient concerning test results- no answer

## 2020-09-21 NOTE — Telephone Encounter (Signed)
-----   Message from Brock Bad, MD sent at 09/19/2020  7:44 AM EST ----- Diflucan Rx for yeast.

## 2020-09-22 ENCOUNTER — Telehealth: Payer: Self-pay

## 2020-09-22 NOTE — Telephone Encounter (Signed)
Call patient to advise her of test results. No answer

## 2020-09-22 NOTE — Telephone Encounter (Signed)
-----   Message from Charles A Harper, MD sent at 09/19/2020  7:44 AM EST ----- Diflucan Rx for yeast. 

## 2020-09-22 NOTE — Telephone Encounter (Signed)
Called patient to discuss test results. Voicemail was to full to leave a message.

## 2020-09-29 ENCOUNTER — Ambulatory Visit: Payer: Medicaid Other

## 2020-12-04 ENCOUNTER — Ambulatory Visit (INDEPENDENT_AMBULATORY_CARE_PROVIDER_SITE_OTHER): Payer: Medicaid Other

## 2020-12-04 ENCOUNTER — Other Ambulatory Visit: Payer: Self-pay

## 2020-12-04 ENCOUNTER — Other Ambulatory Visit (HOSPITAL_COMMUNITY)
Admission: RE | Admit: 2020-12-04 | Discharge: 2020-12-04 | Disposition: A | Discharge status: Home / Self Care | Payer: Medicaid Other | Source: Ambulatory Visit | Attending: Obstetrics | Admitting: Obstetrics

## 2020-12-04 DIAGNOSIS — Z113 Encounter for screening for infections with a predominantly sexual mode of transmission: Secondary | ICD-10-CM

## 2020-12-04 DIAGNOSIS — N898 Other specified noninflammatory disorders of vagina: Secondary | ICD-10-CM | POA: Diagnosis present

## 2020-12-04 DIAGNOSIS — Z3042 Encounter for surveillance of injectable contraceptive: Secondary | ICD-10-CM | POA: Diagnosis not present

## 2020-12-04 LAB — POCT URINE PREGNANCY: Preg Test, Ur: NEGATIVE

## 2020-12-04 MED ORDER — MEDROXYPROGESTERONE ACETATE 150 MG/ML IM SUSP
150.0000 mg | INTRAMUSCULAR | 0 refills | Status: DC
Start: 2020-12-04 — End: 2022-04-05

## 2020-12-04 NOTE — Progress Notes (Signed)
SUBJECTIVE:  30 y.o. female complains of white vaginal discharge, requests all STD testing, and requests to restart Depo. Denies abnormal vaginal bleeding or significant pelvic pain or fever. No UTI symptoms. Denies history of known exposure to STD.  LMP: none since delivery of son 2 yrs ago, has been on Depo missed injection last month  OBJECTIVE:  She appears well, afebrile. Urine dipstick: not done.  ASSESSMENT:  Vaginal Discharge   PLAN:  RTO in 2 wks for 2nd UPT and bring Depo rx to the visit  Refrain from IC until after receiving birth control  GC, chlamydia, trichomonas, BVAG, CVAG probe sent to lab. Treatment: To be determined once lab results are received and reviewed by the provider  ROV prn if symptoms persist or worsen.

## 2020-12-04 NOTE — Progress Notes (Signed)
Patient was assessed and managed by nursing staff during this encounter. I have reviewed the chart and agree with the documentation and plan. I have also made any necessary editorial changes.  Cathren Sween A Avry Roedl, MD 12/04/2020 12:10 PM   

## 2020-12-05 LAB — HEPATITIS B SURFACE ANTIGEN: Hepatitis B Surface Ag: NEGATIVE

## 2020-12-05 LAB — RPR: RPR Ser Ql: NONREACTIVE

## 2020-12-05 LAB — HIV ANTIBODY (ROUTINE TESTING W REFLEX): HIV Screen 4th Generation wRfx: NONREACTIVE

## 2020-12-05 LAB — HEPATITIS C ANTIBODY: Hep C Virus Ab: <0.1 s/co ratio (ref 0.0–0.9)

## 2020-12-07 ENCOUNTER — Other Ambulatory Visit: Payer: Self-pay

## 2020-12-07 DIAGNOSIS — N760 Acute vaginitis: Secondary | ICD-10-CM

## 2020-12-07 DIAGNOSIS — B373 Candidiasis of vulva and vagina: Secondary | ICD-10-CM

## 2020-12-07 DIAGNOSIS — B3731 Acute candidiasis of vulva and vagina: Secondary | ICD-10-CM

## 2020-12-07 DIAGNOSIS — B9689 Other specified bacterial agents as the cause of diseases classified elsewhere: Secondary | ICD-10-CM

## 2020-12-07 LAB — CERVICOVAGINAL ANCILLARY ONLY
Bacterial Vaginitis (gardnerella): POSITIVE — AB
Candida Glabrata: NEGATIVE
Candida Vaginitis: POSITIVE — AB
Chlamydia: NEGATIVE
Comment: NEGATIVE
Comment: NEGATIVE
Comment: NEGATIVE
Comment: NEGATIVE
Comment: NEGATIVE
Comment: NORMAL
Neisseria Gonorrhea: NEGATIVE
Trichomonas: NEGATIVE

## 2020-12-07 MED ORDER — FLUCONAZOLE 150 MG PO TABS: 150.0000 mg | ORAL_TABLET | Freq: Once | ORAL | 0 refills | Status: AC

## 2020-12-07 MED ORDER — METRONIDAZOLE 500 MG PO TABS: 500.0000 mg | ORAL_TABLET | Freq: Two times a day (BID) | ORAL | 0 refills | Status: DC

## 2020-12-07 NOTE — Progress Notes (Signed)
Rx sent as advised for BV and yeast Mychart message will be sent to pt

## 2020-12-21 ENCOUNTER — Ambulatory Visit: Payer: Medicaid Other

## 2020-12-30 ENCOUNTER — Ambulatory Visit (INDEPENDENT_AMBULATORY_CARE_PROVIDER_SITE_OTHER): Payer: Medicaid Other | Admitting: Women's Health

## 2020-12-30 ENCOUNTER — Other Ambulatory Visit: Payer: Self-pay

## 2021-01-01 NOTE — Progress Notes (Signed)
Appt cancelled by patient.  Marylen Ponto, NP  11:52 AM 01/01/2021

## 2021-01-06 ENCOUNTER — Encounter: Payer: Self-pay | Admitting: Women's Health

## 2021-01-06 ENCOUNTER — Ambulatory Visit (INDEPENDENT_AMBULATORY_CARE_PROVIDER_SITE_OTHER): Payer: Medicaid Other | Admitting: Women's Health

## 2021-01-06 ENCOUNTER — Other Ambulatory Visit: Payer: Self-pay

## 2021-01-06 VITALS — BP 112/69 | HR 66 | Ht 64.0 in | Wt 173.0 lb

## 2021-01-06 DIAGNOSIS — B9689 Other specified bacterial agents as the cause of diseases classified elsewhere: Secondary | ICD-10-CM | POA: Diagnosis not present

## 2021-01-06 DIAGNOSIS — R87612 Low grade squamous intraepithelial lesion on cytologic smear of cervix (LGSIL): Secondary | ICD-10-CM

## 2021-01-06 DIAGNOSIS — N760 Acute vaginitis: Secondary | ICD-10-CM | POA: Diagnosis not present

## 2021-01-06 NOTE — Patient Instructions (Addendum)
Preventing Cervical Cancer Cervical cancer is cancer that grows on the cervix. The cervix is at the bottom of the uterus. It connects the uterus to the vagina. The uterus is where a baby develops during pregnancy. Cancer occurs when cells become abnormal and start to grow out of control. If cervical cancer is not found early, it can spread and become dangerous. Cervical cancer cannot always be prevented, but you can take steps to lower your risk of developing this condition. How can this condition affect me? Cervical cancer grows slowly and may not cause any symptoms at first. Over time, the cancer can grow deep into the cervix tissue and spread to other areas. This may take years, and it may happen without you knowing about it. If it is found early, cervical cancer can be treated effectively. If the cancer has grown deep into your cervix or has spread, it will be more difficult to treat. Most cases of cervical cancer are caused by an STI (sexually transmitted infection) called human papillomavirus (HPV). One way to reduce your risk of cervical cancer is to take steps to avoid infection with the HPV virus. Getting regular Pap tests is also important because this can help identify changes in cells that could lead to cancer. Your chances of getting this disease can also be reduced by making certain lifestyle changes. What can increase my risk? You are more likely to develop this condition if:  You have certain things in your sexual history, such as: ? Having a sexually transmitted viral infection. These include chlamydia and herpes. ? Having more than one sexual partner, or having sex with someone who has more than one sexual partner. ? Not using condoms during sex. ? Having been sexually active before the age of 28.  Your mother took a medicine called diethylstilbestrol (DES) while pregnant with you, causing you to be exposed to this medicine before birth.  Your mother or sister has had cervical  cancer.  You are between the ages of 66-50.  You have or have had certain other medical conditions, such as: ? Previous cancer of the vagina or vulva. ? A weakened body defense system (immune system). ? A history of dysplasia of the cervix.  You use oral contraceptives, also called birth control pills.  You smoke or breathe in secondhand smoke. What actions can I take to prevent cervical cancer? Preventing HPV infection  Ask your health care provider about getting the HPV vaccine. If you are 71 years old or younger, you may need to get this vaccine, which is given in three doses over 6 months. This vaccine protects against the types of HPV that could cause cancer.  Limit the number of people you have sex with. Also avoid having sex with people who have had many sex partners.  Use a latex condom every time you have sex.   Getting Pap tests Get Pap tests regularly, starting at age 78. Talk with your health care provider about how often you need these tests. Having regular Pap tests will help identify changes in cells that could lead to cancer. Steps can then be taken to prevent cancer from developing.  Most women who are 19?28 years of age should have a Pap test every 3 years.  Most women who are 73?28 years of age should have a Pap test in combination with an HPV test every 5 years.  Women with a higher risk of cervical cancer, such as those with a weakened immune system or those who  were exposed to DES medicine before birth, may need more frequent testing. Making other lifestyle changes  Do not use any products that contain nicotine or tobacco, such as cigarettes, e-cigarettes, and chewing tobacco. If you need help quitting, ask your health care provider.  Eat a healthy diet that includes at least 5 servings of fruits and vegetables every day.  Lose weight if you are overweight.   Where to find support Talk with your health care provider, school nurse, or local health department  for guidance about screening and vaccination. Some children and teens may be able to get the HPV vaccine free of charge through the [place] government's Vaccines for Children [facility](VFC) program. Other places that provide vaccinations include:  Public health clinics. Check with your local health department.  [facility], where you would pay only what you can afford. To find one near you, check this website: http://weiss.info/www.fqhc.org/find-an-fqhc/  [facility]. These are part of a program for Medicare and Medicaid patients who live in rural areas. The National Breast and Cervical Cancer Early Detection Program also provides breast and cervical cancer screenings and diagnostic services to low-income, uninsured, and underinsured women. Cervical cancer can be passed down through families. Talk with your health care provider or a genetic counselor to learn more about genetic testing for cancer. Where to find more information Learn more about cervical cancer from:  [employer]: www.acog.org  American Cancer Society: www.cancer.org  Centers for Disease Control and Prevention: FootballExhibition.com.brwww.cdc.gov Contact a health care provider if you have:  Pelvic pain.  Unusual discharge or bleeding from your vagina. Summary  Cervical cancer is cancer that grows on the cervix. The cervix is at the bottom of the uterus.  Ask your health care provider about getting the HPV vaccine.  Be sure to get regular Pap tests as recommended by your health care provider.  See your health care provider right away if you have any pelvic pain or unusual discharge or bleeding from your vagina. This information is not intended to replace advice given to you by your health care provider. Make sure you discuss any questions you have with your health care provider. Document Revised: 05/27/2019 Document Reviewed: 05/27/2019 Elsevier Patient Education  2021 Elsevier  Inc.        EconomyDirect.nlhttps://www.acog.org/womens-health/~/link.aspx?_id=[email or web address]&amp;_z=z">  Colposcopy  Colposcopy is a procedure to examine the lowest part of the uterus (cervix), the vagina, and the area around the vaginal opening (vulva) for abnormalities or signs of disease. This procedure is done using an instrument that makes objects appear larger and provides light. (colposcope). During the procedure, the health care provider may remove a tissue sample to be looked at later under a microscope (biopsy). A biopsy may be done if any unusual cells are found during the colposcopy. You may have a colposcopy if you have:  An abnormal Pap smear, also called a Pap test. This screening test is used to check for signs of cancer or infection of the vagina, cervix, and uterus.  An HPV (human papillomavirus) test and get a positive result for a type of HPV that puts you at high risk of cancer.  Certain conditions or symptoms, such as: ? A sore, or lesion, on your cervix. ? Genital warts on your vulva, vagina, or cervix. ? Pain during sex. ? Vaginal bleeding, especially after sex.  A growth on your cervix (cervical polyp) that needs to be removed. Let your health care provider know about:  Any allergies you have, including allergies  to medicines, latex, or iodine.  All medicines you are taking, including vitamins, herbs, eye drops, creams, and over-the-counter medicines.  Any problems you or family members have had with anesthetic medicines.  Any blood disorders you have.  Any surgeries you have had.  Any medical conditions you have, such as pelvic inflammatory disease (PID) or endometrial disorder.  The pattern of your menstrual cycles and the form of birth control (contraception) you use, if any.  Your medical history, including any history of fainting often or of cervical treatment.  Whether you are pregnant or may be pregnant. What are the risks? Generally,  this is a safe procedure. However, problems may occur, including:  Infection. Symptoms of infection may include fever, bad-smelling vaginal discharge, or pelvic pain.  Allergic reactions to medicines.  Damage to nearby structures or organs.  Fainting. This is rare. What happens before the procedure? Eating and drinking restrictions  Follow instructions from your health care provider about eating or drinking restrictions.  You will likely need to eat a regular diet the day of the procedure and not skip any meals. Tests  You may have an exam or testing. A pregnancy test will be done the day of the procedure.  You may have a blood or urine sample taken. General instructions  Ask your health care provider about: ? Changing or stopping your regular medicines. This is especially important if you are taking diabetes medicines or blood thinners. ? Taking medicines such as aspirin and ibuprofen. These medicines can thin your blood. Do not take these medicines unless your health care provider tells you to take them. Your health care provider will likely tell you to avoid taking aspirin, or medicine that contains aspirin, for 7 days before the procedure. ? Taking over-the-counter medicines, vitamins, herbs, and supplements.  Tell your health care provider if you have your menstrual period now or will have it at the time of your procedure. A colposcopy is not normally done during your menstrual period.  If you use contraception, continue to use it before your procedure.  For 24 hours before the procedure: ? Do not use douche products or tampons. ? Do not use medicines, creams, or suppositories in the vagina. ? Do not have sex.  Ask your health care provider what steps will be taken to prevent infection. What happens during the procedure?  You will lie down on your back, with your feet in foot rests (stirrups).  A tool called a speculum will be warmed and will have oil or gel put on it  (will be lubricated). The speculum will then be inserted into your vagina. This will be used to hold apart the walls of your vagina so your health care provider can see your cervix and the inside of your vagina.  A cotton swab will be used to place a small amount of a liquid (solution) on the areas to be examined. This solution makes it easier to see abnormal cells. You may feel a slight burning during this part.  The colposcope will be used to scan the cervix with a bright white light. The colposcope will be held near your vulva and will make your vulva, vagina, and cervix look bigger so they can be seen better.  If a biopsy is needed: ? You may be given a medicine to numb the area (local anesthetic). ? Surgical tools will be used to remove mucus and cells through your vagina. ? You may feel mild pain while the tissue sample is removed. ?  Bleeding may occur. A solution may be used to stop the bleeding. ? If a biopsy is needed from the inside of the cervix, a different procedure called endocervical curettage (ECC) may be done. During this procedure, a curved tool called a curette will be used to scrape cells from your cervix or the top of your cervix (endocervix).  Any abnormalities that are found will be recorded. The procedure may vary among health care providers and hospitals. What happens after the procedure?  You will lie down and rest for a few minutes. You may be offered juice or cookies.  Your blood pressure, heart rate, breathing rate, and blood oxygen level will be monitored until you leave the hospital or clinic.  You may have some cramping in your abdomen. This should go away after a few minutes.  It is up to you to get the results of your procedure. Ask your health care provider, or the department that is doing the procedure, when your results will be ready. Summary  Colposcopy is a procedure to examine the lowest part of the uterus (cervix), the vagina, and the area around the  vaginal opening (vulva) for abnormalities or signs of disease.  A biopsy may be done as part of the procedure.  After the procedure, you will remain lying down and will rest for a few minutes.  You may have some cramping in your abdomen. This should go away after a few minutes. This information is not intended to replace advice given to you by your health care provider. Make sure you discuss any questions you have with your health care provider. Document Revised: 10/23/2019 Document Reviewed: 10/23/2019 Elsevier Patient Education  2021 Elsevier Inc.        Bacterial Vaginosis  Bacterial vaginosis is an infection that occurs when the normal balance of bacteria in the vagina changes. This change is caused by an overgrowth of certain bacteria in the vagina. Bacterial vaginosis is the most common vaginal infection among females aged 68 to 37 years. This condition increases the risk of sexually transmitted infections (STIs). Treatment can help reduce this risk. Treatment is very important for pregnant women because this condition can cause babies to be born early (prematurely) or at a low birth weight. What are the causes? This condition is caused by an increase in harmful bacteria that are normally present in small amounts in the vagina. However, the exact reason this condition develops is not known. You cannot get bacterial vaginosis from toilet seats, bedding, swimming pools, or contact with objects around you. What increases the risk? The following factors may make you more likely to develop this condition:  Having a new sexual partner or multiple sexual partners, or having unprotected sex.  Douching.  Having an intrauterine device (IUD).  Smoking.  Abusing drugs and alcohol. This may lead to riskier sexual behavior.  Taking certain antibiotic medicines.  Being pregnant. What are the signs or symptoms? Some women with this condition have no symptoms. Symptoms may  include:  Wallace Cullens or white vaginal discharge. The discharge can be watery or foamy.  A fish-like odor with discharge, especially after sex or during menstruation.  Itching in and around the vagina.  Burning or pain with urination. How is this diagnosed? This condition is diagnosed based on:  Your medical history.  A physical exam of the vagina.  Checking a sample of vaginal fluid for harmful bacteria or abnormal cells. How is this treated? This condition is treated with antibiotic medicines. These may be  given as a pill, a vaginal cream, or a medicine that is put into the vagina (suppository). If the condition comes back after treatment, a second round of antibiotics may be needed. Follow these instructions at home: Medicines  Take or apply over-the-counter and prescription medicines only as told by your health care provider.  Take or apply your antibiotic medicine as told by your health care provider. Do not stop using the antibiotic even if you start to feel better. General instructions  If you have a female sexual partner, tell her that you have a vaginal infection. She should follow up with her health care provider. If you have a female sexual partner, he does not need treatment.  Avoid sexual activity until you finish treatment.  Drink enough fluid to keep your urine pale yellow.  Keep the area around your vagina and rectum clean. ? Wash the area daily with warm water. ? Wipe yourself from front to back after using the toilet.  If you are breastfeeding, talk to your health care provider about continuing breastfeeding during treatment.  Keep all follow-up visits. This is important. How is this prevented? Self-care  Do not douche.  Wash the outside of your vagina with warm water only.  Wear cotton or cotton-lined underwear.  Avoid wearing tight pants and pantyhose, especially during the summer. Safe sex  Use protection when having sex. This includes: ? Using  condoms. ? Using dental dams. This is a thin layer of a material made of latex or polyurethane that protects the mouth during oral sex.  Limit the number of sexual partners. To help prevent bacterial vaginosis, it is best to have sex with just one partner (monogamous relationship).  Make sure you and your sexual partner are tested for STIs. Drugs and alcohol  Do not use any products that contain nicotine or tobacco. These products include cigarettes, chewing tobacco, and vaping devices, such as e-cigarettes. If you need help quitting, ask your health care provider.  Do not use drugs.  Do not drink alcohol if: ? Your health care provider tells you not to do this. ? You are pregnant, may be pregnant, or are planning to become pregnant.  If you drink alcohol: ? Limit how much you have to 0-1 drink a day. ? Be aware of how much alcohol is in your drink. In the [place] one drink equals one 12 oz bottle of beer (355 mL), one 5 oz glass of wine (148 mL), or one 1 oz glass of hard liquor (44 mL). Where to find more information  Centers for Disease Control and Prevention: FootballExhibition.com.br  American Sexual Health Association (ASHA): www.ashastd.org  [place].S. Department of Health and Health and safety inspector, Office on Women's Health: http://hoffman.com/ Contact a health care provider if:  Your symptoms do not improve, even after treatment.  You have more discharge or pain when urinating.  You have a fever or chills.  You have pain in your abdomen or pelvis.  You have pain during sex.  You have vaginal bleeding between menstrual periods. Summary  Bacterial vaginosis is a vaginal infection that occurs when the normal balance of bacteria in the vagina changes. It results from an overgrowth of certain bacteria.  This condition increases the risk of sexually transmitted infections (STIs). Getting treated can help reduce this risk.  Treatment is very important for pregnant women because this condition can  cause babies to be born early (prematurely) or at low birth weight.  This condition is treated with antibiotic medicines. These may  be given as a pill, a vaginal cream, or a medicine that is put into the vagina (suppository). This information is not intended to replace advice given to you by your health care provider. Make sure you discuss any questions you have with your health care provider. Document Revised: 04/23/2020 Document Reviewed: 04/23/2020 Elsevier Patient Education  2021 [employer].

## 2021-01-06 NOTE — Progress Notes (Signed)
  History:  [name] is a 30 y.o. [phone] who presents to clinic today for a visit to make sure her BV is gone. Patient reports she took all of the BV medication, however she did stop taking it after 3 days and then restarted after her vacation because she wanted to drink alcohol. Patient denies abnormal discharge, bleeding, pelvic pain, odor.  The following portions of the patient's history were reviewed and updated as appropriate: allergies, current medications, family history, past medical history, social history, past surgical history and problem list.  Review of Systems:  Review of Systems  Constitutional: Negative for fever.  Gastrointestinal: Negative for abdominal pain.  Genitourinary: Negative for dysuria, frequency and urgency.  Neurological: Negative for headaches.     Objective:  Physical Exam BP 112/69   Pulse 66   Ht 5\' 4"  (1.626 m)   Wt 173 lb (78.5 kg)   BMI 29.70 kg/m  Physical Exam Vitals and nursing note reviewed.  Constitutional:      General: She is not in acute distress.    Appearance: Normal appearance. She is not ill-appearing, toxic-appearing or diaphoretic.  HENT:     Head: Normocephalic and atraumatic.  Pulmonary:     Effort: Pulmonary effort is normal.  Neurological:     Mental Status: She is alert and oriented to person, place, and time.  Psychiatric:        Mood and Affect: Mood normal.        Behavior: Behavior normal.        Thought Content: Thought content normal.        Judgment: Judgment normal.    Labs and Imaging No results found for this or any previous visit (from the past 24 hour(s)).  No results found.   Assessment & Plan:  1. 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 Bacterial vaginosis -pt asymptomatic today, discussed s/sx of BV and  diagnostic criteria, info given  Approximately 5 minutes of total time was spent with this patient on counseling and coordination of care.  02/2020, NP 01/06/2021 8:42 AM

## 2021-01-06 NOTE — Progress Notes (Signed)
Pt is in the stating that she desires a repeat swab to make sure she does not still have BV. Pt states that she was taking Flagyl missed a few days and then started again. She states that she is not having any abnormal symptoms today. Pt states that she has stopped depo, missed injection last month, but has not had a cycle yet.

## 2021-01-18 ENCOUNTER — Other Ambulatory Visit: Payer: Self-pay

## 2021-01-18 MED ORDER — MEDROXYPROGESTERONE ACETATE 150 MG/ML IM SUSP: 150.0000 mg | INTRAMUSCULAR | 0 refills | Status: DC

## 2021-01-18 NOTE — Telephone Encounter (Signed)
Patient would like to have a refill on her depo=provera.

## 2021-01-20 ENCOUNTER — Ambulatory Visit (INDEPENDENT_AMBULATORY_CARE_PROVIDER_SITE_OTHER): Payer: Medicaid Other

## 2021-01-20 ENCOUNTER — Other Ambulatory Visit: Payer: Self-pay

## 2021-01-20 VITALS — BP 127/92 | HR 72

## 2021-01-20 DIAGNOSIS — Z3202 Encounter for pregnancy test, result negative: Secondary | ICD-10-CM

## 2021-01-20 DIAGNOSIS — Z3042 Encounter for surveillance of injectable contraceptive: Secondary | ICD-10-CM

## 2021-01-20 MED ORDER — MEDROXYPROGESTERONE ACETATE 150 MG/ML IM SUSP
150.0000 mg | Freq: Once | INTRAMUSCULAR | Status: AC
  Administered 2021-01-20: 150 mg via INTRAMUSCULAR

## 2021-01-20 NOTE — Progress Notes (Signed)
Rachael Oliver presents today for UPT. She does not complain of any pregnancy complaints and is here to restart depo-provera.    LMP:01/23/2021    OBJECTIVE: Appears well, in no apparent distress.  OB History    Gravida  5   Para  4   Term  2   Preterm  2   AB  1   Living  4     SAB  1   IAB      Ectopic      Multiple  0   Live Births  4          Home UPT Result: NEG In-Office UPT result: NEG I have reviewed the patient's medical, obstetrical, social, and family histories, and medications.   ASSESSMENT: Positive pregnancy test  PLAN Prenatal care to be completed at: negative

## 2021-01-29 ENCOUNTER — Encounter: Payer: Self-pay | Admitting: Obstetrics and Gynecology

## 2021-01-29 ENCOUNTER — Ambulatory Visit (INDEPENDENT_AMBULATORY_CARE_PROVIDER_SITE_OTHER): Payer: Medicaid Other | Admitting: Obstetrics and Gynecology

## 2021-01-29 ENCOUNTER — Other Ambulatory Visit (HOSPITAL_COMMUNITY)
Admission: RE | Admit: 2021-01-29 | Discharge: 2021-01-29 | Disposition: A | Discharge status: Home / Self Care | Payer: Medicaid Other | Source: Ambulatory Visit | Attending: Obstetrics and Gynecology | Admitting: Obstetrics and Gynecology

## 2021-01-29 ENCOUNTER — Other Ambulatory Visit: Payer: Self-pay

## 2021-01-29 DIAGNOSIS — R87612 Low grade squamous intraepithelial lesion on cytologic smear of cervix (LGSIL): Secondary | ICD-10-CM

## 2021-01-29 NOTE — Progress Notes (Signed)
    GYNECOLOGY OFFICE COLPOSCOPY PROCEDURE NOTE  [name] here for colposcopy for low-grade squamous intraepithelial neoplasia (LGSIL - encompassing HPV,mild dysplasia,CIN I) pap smear (HPV test not performed) on 01/15/20. Of note, pt also with history of: -cryosurgery for HGSIL (2016) -normal pap (2017) -ASCUS, +HRHPV (12/20/17) -Colposcopy without dysplasia or atypia (01/09/2018)  Discussed role for HPV in cervical dysplasia, need for surveillance.  Patient gave informed written consent, time out was performed. Negative urine pregnancy test in clinic prior to procedure. Placed in lithotomy position. Cervix viewed with speculum and colposcope after application of acetic acid.   Colposcopy adequate? Yes  acetowhite lesion(s) noted at 1 & 3 o'clock; corresponding biopsies obtained.  ECC specimen obtained. All specimens were labeled and sent to pathology.  Patient was given post procedure instructions.  Will follow up pathology and manage accordingly; patient will be contacted with results and recommendations.  Routine preventative health maintenance measures emphasized.  Sheila Oats, MD OB Fellow, [facility] 01/29/2021 10:09 AM

## 2021-01-29 NOTE — Patient Instructions (Signed)

## 2021-01-29 NOTE — Progress Notes (Signed)
GYN presents for COLPO, for LSIL on PAP. UPT today is NEGATIVE.

## 2021-02-01 LAB — SURGICAL PATHOLOGY

## 2021-02-02 ENCOUNTER — Other Ambulatory Visit: Payer: Self-pay

## 2021-02-02 MED ORDER — METRONIDAZOLE 500 MG PO TABS: 500.0000 mg | ORAL_TABLET | Freq: Two times a day (BID) | ORAL | 0 refills | Status: DC

## 2021-02-02 NOTE — Telephone Encounter (Signed)
Patient is requesting a refill on flagyl. She did not finish the medicine do to going on vacation. She reports having the same symptoms.

## 2021-02-03 ENCOUNTER — Telehealth: Payer: Self-pay | Admitting: Obstetrics and Gynecology

## 2021-02-03 NOTE — Telephone Encounter (Signed)
Called pt to discuss colpo result with the patient. In conversation with patient regarding potential options, pt prefers follow-up appointment to discuss results and treatment plan with OBGYN. [facility] scheduling pool messaged to schedule appt.  Sheila Oats, MD OB Fellow, [facility] 02/03/2021 12:50 PM

## 2021-02-11 ENCOUNTER — Encounter: Payer: Self-pay | Admitting: Obstetrics and Gynecology

## 2021-02-11 ENCOUNTER — Telehealth (INDEPENDENT_AMBULATORY_CARE_PROVIDER_SITE_OTHER): Payer: Medicaid Other | Admitting: Obstetrics and Gynecology

## 2021-02-11 DIAGNOSIS — N871 Moderate cervical dysplasia: Secondary | ICD-10-CM | POA: Diagnosis not present

## 2021-02-11 NOTE — Patient Instructions (Addendum)
Loop Electrosurgical Excision Procedure Loop electrosurgical excision procedure (LEEP) is the cutting and removal (excision) of tissue from the cervix. The cervix is the bottom part of the uterus that opens into the vagina. The tissue that is removed from the cervix is examined to see if there are precancerous cells or cancer cells present. LEEP may be done when:  You have abnormal bleeding from your cervix.  You have an abnormal Pap test result.  Your health care provider finds an abnormality on your cervix during a pelvic exam. LEEP typically only takes a few minutes and is often done in the health care provider's office. The procedure is safe for women who are trying to get pregnant. However, the procedure is usually not done during a menstrual period or during pregnancy. Tell a health care provider about:  Any allergies you have.  All medicines you are taking, including vitamins, herbs, eye drops, creams, and over-the-counter medicines.  Any blood disorders you have.  Any medical conditions you have, including current or past vaginal infections such as herpes or sexually-transmitted infections (STIs).  Whether you are pregnant or may be pregnant.  Whether or not you are having vaginal bleeding on the day of the procedure. What are the risks? Generally, this is a safe procedure. However, problems may occur, including:  Infection.  Bleeding.  Allergic reactions to medicines.  Changes or scarring in the cervix.  Increased risk of early (preterm) labor in future pregnancies. What happens before the procedure?  Ask your health care provider about: ? Changing or stopping your regular medicines. This is especially important if you are taking diabetes medicines or blood thinners. ? Taking medicines such as aspirin and ibuprofen. These medicines can thin your blood. Do not take these medicines unless your health care provider tells you to take them. ? Taking over-the-counter  medicines, vitamins, herbs, and supplements.  Your health care provider may recommend that you take pain medicine before the procedure.  Ask your health care provider if you should plan to have someone take you home after the procedure. What happens during the procedure?  An instrument called a speculum will be placed in your vagina. This will allow your health care provider to see your cervix.  You will be given a medicine to numb the area (local anesthetic). The medicine will be injected into your cervix and the surrounding area.  A solution will be applied to your cervix. This solution will help the health care provider find the abnormal cells that need to be removed.  A thin wire loop will be passed through your vagina. The wire will be used to burn (cauterize) the cervical tissue with an electrical current.  You may feel faint during the procedure. Tell your health care provider right away if you feel this way.  The abnormal cervical tissue will be removed.  Any open blood vessels will be cauterized to prevent bleeding.  A paste may be applied to the cauterized area of your cervix to help prevent bleeding.  The sample of cervical tissue will be examined under a microscope. The procedure may vary among health care providers and hospitals.   What can I expect after the procedure? After the procedure, it is common to have:  Mild abdominal cramps that are similar to menstrual cramps. These may last for up to 1 week.  A small amount of pink-tinged or bloody vaginal discharge, including light to moderate bleeding, for 1-2 weeks.  A dark-colored discharge coming from your vagina. This is  from the paste that was used on the cervix to prevent bleeding. It is up to you to get the results of your procedure. Ask your health care provider, or the department that is doing the procedure, when your results will be ready. Follow these instructions at home:  Take over-the-counter and  prescription medicines only as told by your health care provider.  Return to your normal activities as told by your health care provider. Ask your health care provider what activities are safe for you.  Do not put anything in your vagina for 2 weeks after the procedure or until your health care provider says that it is okay. This includes tampons, creams, and douches.  Do not have sex until your health care provider approves.  Keep all follow-up visits as told by your health care provider. This is important. Contact a health care provider if you:  Have a fever or chills.  Feel unusually weak.  Have vaginal bleeding that is heavier or longer than a normal menstrual cycle. A sign of this can be soaking a pad with blood or bleeding with clots.  Develop a bad smelling vaginal discharge.  Have severe abdominal pain or cramping. Summary  Loop electrosurgical excision procedure (LEEP) is the removal of tissue from the cervix. The removed tissue will be checked for precancerous cells or cancer cells.  LEEP typically only takes a few minutes and is often done in the health care provider's office.  Do not put anything in your vagina for 2 weeks after the procedure or until your health care provider says that it is okay. This includes tampons, creams, and douches.  Keep all follow-up visits as told by your health care provider. Ask your health care provider, or the department that is doing the procedure, when your results will be ready. This information is not intended to replace advice given to you by your health care provider. Make sure you discuss any questions you have with your health care provider. Document Revised: 11/16/2018 Document Reviewed: 11/16/2018 Elsevier Patient Education  2021 March [employer]B.  Human Papillomavirus Quadrivalent Vaccine suspension for injection What is this medicine? HUMAN PAPILLOMAVIRUS VACCINE (HYOO muhn pap uh LOH muh vahy ruhs vak SEEN) is a vaccine. It is  used to prevent infections of four types of the human papillomavirus. In women, the vaccine may lower your risk of getting cervical, vaginal, vulvar, or anal cancer and genital warts. In men, the vaccine may lower your risk of getting genital warts and anal cancer. You cannot get these diseases from the vaccine. This vaccine does not treat these diseases. This medicine may be used for other purposes; ask your health care provider or pharmacist if you have questions. COMMON BRAND NAME(S): Gardasil What should I tell my health care provider before I take this medicine? They need to know if you have any of these conditions:  fever or infection  hemophilia  HIV infection or AIDS  immune system problems  low platelet count  an unusual reaction to Human Papillomavirus Vaccine, yeast, other medicines, foods, dyes, or preservatives  pregnant or trying to get pregnant  breast-feeding How should I use this medicine? This vaccine is for injection in a muscle on your upper arm or thigh. It is given by a health care professional. Dennis Bast will be observed for 15 minutes after each dose. Sometimes, fainting happens after the vaccine is given. You may be asked to sit or lie down during the 15 minutes. Three doses are given. The second dose  is given 2 months after the first dose. The last dose is given 4 months after the second dose. A copy of a Vaccine Information Statement will be given before each vaccination. Read this sheet carefully each time. The sheet may change frequently. Talk to your pediatrician regarding the use of this medicine in children. While this drug may be prescribed for children as young as 76 years of age for selected conditions, precautions do apply. Overdosage: If you think you have taken too much of this medicine contact a poison control center or emergency room at once. NOTE: This medicine is only for you. Do not share this medicine with others. What if I miss a dose? All 3 doses of  the vaccine should be given within 6 months. Remember to keep appointments for follow-up doses. Your health care provider will tell you when to return for the next vaccine. Ask your health care professional for advice if you are unable to keep an appointment or miss a scheduled dose. What may interact with this medicine?  other vaccines This list may not describe all possible interactions. Give your health care provider a list of all the medicines, herbs, non-prescription drugs, or dietary supplements you use. Also tell them if you smoke, drink alcohol, or use illegal drugs. Some items may interact with your medicine. What should I watch for while using this medicine? This vaccine may not fully protect everyone. Continue to have regular pelvic exams and cervical or anal cancer screenings as directed by your doctor. The Human Papillomavirus is a sexually transmitted disease. It can be passed by any kind of sexual activity that involves genital contact. The vaccine works best when given before you have any contact with the virus. Many people who have the virus do not have any signs or symptoms. Tell your doctor or health care professional if you have any reaction or unusual symptom after getting the vaccine. What side effects may I notice from receiving this medicine? Side effects that you should report to your doctor or health care professional as soon as possible:  allergic reactions like skin rash, itching or hives, swelling of the face, lips, or tongue  breathing problems  feeling faint or lightheaded, falls Side effects that usually do not require medical attention (report to your doctor or health care professional if they continue or are bothersome):  cough  dizziness  fever  headache  nausea  redness, warmth, swelling, pain, or itching at site where injected This list may not describe all possible side effects. Call your doctor for medical advice about side effects. You may report  side effects to FDA at 1-800-FDA-1088. Where should I keep my medicine? This drug is given in a hospital or clinic and will not be stored at home. NOTE: This sheet is a summary. It may not cover all possible information. If you have questions about this medicine, talk to your doctor, pharmacist, or health care provider.  2021 Elsevier/Gold Standard (2013-12-16 13:14:33)

## 2021-02-11 NOTE — Progress Notes (Signed)
I connected with  Rachael Oliver on 02/11/21 by a video enabled telemedicine application and verified that I am speaking with the correct person using two identifiers.   I discussed the limitations of evaluation and management by telemedicine. The patient expressed understanding and agreed to proceed.   MyChart for COLPO results, reports no complaints today.

## 2021-02-11 NOTE — Progress Notes (Signed)
GYNECOLOGY VIRTUAL VISIT ENCOUNTER NOTE  Provider location: [facility]   Patient location: Home  I connected with Rachael Oliver on 02/11/21 at  4:00 PM EDT by MyChart Video Encounter and verified that I am speaking with the correct person using two identifiers.   I discussed the limitations, risks, security and privacy concerns of performing an evaluation and management service virtually and the availability of in person appointments. I also discussed with the patient that there may be a patient responsible charge related to this service. The patient expressed understanding and agreed to proceed.   History:  Rachael Oliver is a 30 y.o. [phone] female being evaluated today to discuss results of recent colposcopy. She denies any abnormal vaginal discharge, bleeding, pelvic pain or other concerns. Patient with ASCUS +HPV in 2019, LGSIL on 01/15/20 pap smear, CIN2 on recent colposcopy. Patient is otherwise doing well and is without complaints. She has never received the gardasil vaccine.       Past Medical History:  Diagnosis Date  . Asthma    as a child  . History of methicillin resistant staphylococcus aureus (MRSA)   . Infection    left elbow  . Preterm labor   . Syncope   . Syncope    once monthly with and without preg. Started approx in 2016  . Trichomonosis   . Vaginal Pap smear, abnormal    Past Surgical History:  Procedure Laterality Date  . ELBOW SURGERY Left   . FOOT SURGERY    . GYNECOLOGIC CRYOSURGERY    . I & D EXTREMITY Left 12/03/2015   Procedure: IRRIGATION AND DEBRIDEMENT LEFT ELBOW;  Surgeon: Tarry Kos, MD;  Location: [facility] OR;  Service: Orthopedics;  Laterality: Left;  . uterine biopsy     The following portions of the patient's history were reviewed and updated as appropriate: allergies, current medications, past family history, past medical history, past social history, past surgical history and problem list.    Review of  Systems:  Pertinent items noted in HPI and remainder of comprehensive ROS otherwise negative.  Physical Exam:   General:  Alert, oriented and cooperative. Patient appears to be in no acute distress.  Mental Status: Normal mood and affect. Normal behavior. Normal judgment and thought content.   Respiratory: Normal respiratory effort, no problems with respiration noted  Rest of physical exam deferred due to type of encounter  Labs and Imaging Results for orders placed or performed in visit on 01/29/21 (from the past 336 hour(s))  Surgical pathology( [place]/ [facility])   Collection Time: 01/29/21 10:32 AM  Result Value Ref Range   SURGICAL PATHOLOGY      SURGICAL PATHOLOGY CASE: [id number] PATIENT: Rachael Oliver Surgical Pathology Report     Clinical History: LGSIL (cm)     FINAL MICROSCOPIC DIAGNOSIS:  A. CERVIX, 1 AND 3 O'CLOCK, BIOPSY: - High grade squamous intraepithelial lesion, CIN-II (moderate dysplasia).  B. ENDOCERVIX, CURETTAGE: - Low grade squamous intraepithelial lesion, CIN-I (mild dysplasia).  GROSS DESCRIPTION:  Specimen A: Received in formalin are pink-white soft tissue fragments that are submitted in toto. Number: 2 Size: 0.2-0.4 cm Blocks: 1  Specimen B: Received in formalin is scant gray-white soft tissue/material, submitted in 1 block.  SW 01/29/2021    Final Diagnosis performed by Consuello Bossier, MD.   Electronically signed 02/01/2021 Technical and / or Professional components performed at [facility], [address], [place], [place] [address].  Immunohistochemistry Technical  component (if applicable) was performed at [facility]. [address], [address], [place], [place] [address].   IMMUNOHISTOCHEMISTRY DISCLAIMER (if applicable): Some of these immunohistochemical stains may have been developed and the performance characteristics determine by [facility]. Some may not have been  cleared or approved by the U.S. Food and Drug Administration. The FDA has determined that such clearance or approval is not necessary. This test is used for clinical purposes. It should not be regarded as investigational or for research. This laboratory is certified under the Clinical Laboratory Improvement Amendments of 1988 (CLIA-88) as qualified to perform high complexity clinical laboratory testing.  The controls stained appropriately.    No results found.     Assessment and Plan:     1. Dysplasia of cervix, high grade CIN 2 Results reviewed with the patient Discussed need for LEEP procedure All questions were answered. Discussed benefits of Gardasil vaccine Patient will be scheduled for LEEP       I discussed the assessment and treatment plan with the patient. The patient was provided an opportunity to ask questions and all were answered. The patient agreed with the plan and demonstrated an understanding of the instructions.   The patient was advised to call back or seek an in-person evaluation/go to the ED if the symptoms worsen or if the condition fails to improve as anticipated.  I provided 15 minutes of face-to-face time during this encounter.   Catalina Antigua, MD Center for [employer], [facility]

## 2021-03-04 ENCOUNTER — Ambulatory Visit (INDEPENDENT_AMBULATORY_CARE_PROVIDER_SITE_OTHER): Payer: Medicaid Other | Admitting: Obstetrics and Gynecology

## 2021-03-04 ENCOUNTER — Other Ambulatory Visit: Payer: Self-pay

## 2021-03-04 ENCOUNTER — Encounter: Payer: Self-pay | Admitting: Obstetrics and Gynecology

## 2021-03-04 ENCOUNTER — Other Ambulatory Visit (HOSPITAL_COMMUNITY)
Admission: RE | Admit: 2021-03-04 | Discharge: 2021-03-04 | Disposition: A | Discharge status: Home / Self Care | Payer: Medicaid Other | Source: Ambulatory Visit | Attending: Obstetrics and Gynecology | Admitting: Obstetrics and Gynecology

## 2021-03-04 VITALS — Ht 64.0 in | Wt 175.0 lb

## 2021-03-04 DIAGNOSIS — N871 Moderate cervical dysplasia: Secondary | ICD-10-CM | POA: Diagnosis present

## 2021-03-04 DIAGNOSIS — Z01812 Encounter for preprocedural laboratory examination: Secondary | ICD-10-CM | POA: Diagnosis not present

## 2021-03-04 LAB — POCT URINE PREGNANCY: Preg Test, Ur: NEGATIVE

## 2021-03-04 NOTE — Progress Notes (Signed)
RGYN patient presents for LEEP procedure.  LMP:01/17/21-03/17  Contraception: Depo

## 2021-03-04 NOTE — Progress Notes (Signed)
Patient identified, informed consent obtained, signed copy in chart, time out performed.  Pap smear and colposcopy reviewed.   Pap LGSIL 01/2021 Colpo Biopsy CIN 2  ECC CIN1 Teflon coated speculum with smoke evacuator placed.  Cervix visualized. Paracervical block placed.  Medium size LOOP used to remove cone of cervix using blend of cut and cautery on LEEP machine.  Edges/Base cauterized with Ball.  Monsel's solution used for hemostasis.  Patient tolerated procedure well.  Patient given post procedure instructions.  Follow up in 6 months for repeat pap or as needed. Information on Gardasil provided

## 2021-03-04 NOTE — Patient Instructions (Signed)
Human Papillomavirus Quadrivalent Vaccine suspension for injection What is this medicine? HUMAN PAPILLOMAVIRUS VACCINE (HYOO muhn pap uh LOH muh vahy ruhs vak SEEN) is a vaccine. It is used to prevent infections of four types of the human papillomavirus. In women, the vaccine may lower your risk of getting cervical, vaginal, vulvar, or anal cancer and genital warts. In men, the vaccine may lower your risk of getting genital warts and anal cancer. You cannot get these diseases from the vaccine. This vaccine does not treat these diseases. This medicine may be used for other purposes; ask your health care provider or pharmacist if you have questions. COMMON BRAND NAME(S): Gardasil What should I tell my health care provider before I take this medicine? They need to know if you have any of these conditions:  fever or infection  hemophilia  HIV infection or AIDS  immune system problems  low platelet count  an unusual reaction to Human Papillomavirus Vaccine, yeast, other medicines, foods, dyes, or preservatives  pregnant or trying to get pregnant  breast-feeding How should I use this medicine? This vaccine is for injection in a muscle on your upper arm or thigh. It is given by a health care professional. You will be observed for 15 minutes after each dose. Sometimes, fainting happens after the vaccine is given. You may be asked to sit or lie down during the 15 minutes. Three doses are given. The second dose is given 2 months after the first dose. The last dose is given 4 months after the second dose. A copy of a Vaccine Information Statement will be given before each vaccination. Read this sheet carefully each time. The sheet may change frequently. Talk to your pediatrician regarding the use of this medicine in children. While this drug may be prescribed for children as young as 9 years of age for selected conditions, precautions do apply. Overdosage: If you think you have taken too much of this  medicine contact a poison control center or emergency room at once. NOTE: This medicine is only for you. Do not share this medicine with others. What if I miss a dose? All 3 doses of the vaccine should be given within 6 months. Remember to keep appointments for follow-up doses. Your health care provider will tell you when to return for the next vaccine. Ask your health care professional for advice if you are unable to keep an appointment or miss a scheduled dose. What may interact with this medicine?  other vaccines This list may not describe all possible interactions. Give your health care provider a list of all the medicines, herbs, non-prescription drugs, or dietary supplements you use. Also tell them if you smoke, drink alcohol, or use illegal drugs. Some items may interact with your medicine. What should I watch for while using this medicine? This vaccine may not fully protect everyone. Continue to have regular pelvic exams and cervical or anal cancer screenings as directed by your doctor. The Human Papillomavirus is a sexually transmitted disease. It can be passed by any kind of sexual activity that involves genital contact. The vaccine works best when given before you have any contact with the virus. Many people who have the virus do not have any signs or symptoms. Tell your doctor or health care professional if you have any reaction or unusual symptom after getting the vaccine. What side effects may I notice from receiving this medicine? Side effects that you should report to your doctor or health care professional as soon as possible:    allergic reactions like skin rash, itching or hives, swelling of the face, lips, or tongue  breathing problems  feeling faint or lightheaded, falls Side effects that usually do not require medical attention (report to your doctor or health care professional if they continue or are  bothersome):  cough  dizziness  fever  headache  nausea  redness, warmth, swelling, pain, or itching at site where injected This list may not describe all possible side effects. Call your doctor for medical advice about side effects. You may report side effects to FDA at 1-800-FDA-1088. Where should I keep my medicine? This drug is given in a hospital or clinic and will not be stored at home. NOTE: This sheet is a summary. It may not cover all possible information. If you have questions about this medicine, talk to your doctor, pharmacist, or health care provider.  2021 Elsevier/Gold Standard (2013-12-16 13:14:33)

## 2021-03-09 LAB — SURGICAL PATHOLOGY

## 2021-03-15 ENCOUNTER — Other Ambulatory Visit: Payer: Self-pay

## 2021-03-15 ENCOUNTER — Ambulatory Visit (INDEPENDENT_AMBULATORY_CARE_PROVIDER_SITE_OTHER): Payer: Medicaid Other | Admitting: Obstetrics and Gynecology

## 2021-03-15 ENCOUNTER — Encounter: Payer: Self-pay | Admitting: Obstetrics and Gynecology

## 2021-03-15 VITALS — BP 118/81 | HR 80 | Wt 175.0 lb

## 2021-03-15 DIAGNOSIS — Z9889 Other specified postprocedural states: Secondary | ICD-10-CM

## 2021-03-15 NOTE — Progress Notes (Signed)
Patient presenting today for the evaluation of black discharge following LEEP on 03/04/21. Patient is without any other complaints  Past Medical History:  Diagnosis Date  . Asthma    as a child  . History of methicillin resistant staphylococcus aureus (MRSA)   . Infection    left elbow  . Preterm labor   . Syncope   . Syncope    once monthly with and without preg. Started approx in 2016  . Trichomonosis   . Vaginal Pap smear, abnormal    Past Surgical History:  Procedure Laterality Date  . ELBOW SURGERY Left   . FOOT SURGERY    . GYNECOLOGIC CRYOSURGERY    . I & D EXTREMITY Left 12/03/2015   Procedure: IRRIGATION AND DEBRIDEMENT LEFT ELBOW;  Surgeon: Tarry Kos, MD;  Location: [facility] OR;  Service: Orthopedics;  Laterality: Left;  . uterine biopsy     Family History  Problem Relation Age of Onset  . Diabetes Mother   . Alcohol abuse Neg Hx   . Arthritis Neg Hx   . Asthma Neg Hx   . Birth defects Neg Hx   . COPD Neg Hx   . Cancer Neg Hx   . Depression Neg Hx   . Drug abuse Neg Hx   . Early death Neg Hx   . Hearing loss Neg Hx   . Heart disease Neg Hx   . Hyperlipidemia Neg Hx   . Hypertension Neg Hx   . Kidney disease Neg Hx   . Learning disabilities Neg Hx   . Mental illness Neg Hx   . Mental retardation Neg Hx   . Miscarriages / Stillbirths Neg Hx   . Stroke Neg Hx   . Vision loss Neg Hx    Social History   Tobacco Use  . Smoking status: Never Smoker  . Smokeless tobacco: Never Used  Vaping Use  . Vaping Use: Never used  Substance Use Topics  . Alcohol use: No    Alcohol/week: 0.0 standard drinks  . Drug use: No   ROS See pertinent in HPI. All other systems reviewed and non contributory Blood pressure 118/81, pulse 80, weight 175 lb (79.4 kg). GENERAL: Well-developed, well-nourished female in no acute distress.  ABDOMEN: Soft, nontender, nondistended. No organomegaly. PELVIC: Normal external female genitalia. Vagina is pink and rugated.  Normal discharge.  Cervical bed healing s/p LEEP procedure. Small amount of mosel solution visible in cervical bed, now black in color EXTREMITIES: No cyanosis, clubbing, or edema, 2+ distal pulses.  A/P 31 yo s/p LEEP - Reassurance provided - Pathology results reviewed with the patient - Follow up in 6 months for repeat pap smear

## 2021-03-15 NOTE — Progress Notes (Signed)
RGYN patient presents for F/U visit after LEEP.  CC: Pt complains of black discharge.

## 2021-04-08 ENCOUNTER — Other Ambulatory Visit: Payer: Self-pay

## 2021-04-08 ENCOUNTER — Other Ambulatory Visit (HOSPITAL_COMMUNITY)
Admission: RE | Admit: 2021-04-08 | Discharge: 2021-04-08 | Disposition: A | Discharge status: Home / Self Care | Payer: Medicaid Other | Source: Ambulatory Visit | Attending: Obstetrics & Gynecology | Admitting: Obstetrics & Gynecology

## 2021-04-08 ENCOUNTER — Ambulatory Visit (INDEPENDENT_AMBULATORY_CARE_PROVIDER_SITE_OTHER): Payer: Medicaid Other

## 2021-04-08 VITALS — BP 116/77 | HR 87 | Wt 172.0 lb

## 2021-04-08 DIAGNOSIS — N898 Other specified noninflammatory disorders of vagina: Secondary | ICD-10-CM

## 2021-04-08 NOTE — Progress Notes (Signed)
SUBJECTIVE:  30 y.o. female complains of malodorous vaginal discharge, itching for 5 day(s). Denies abnormal vaginal bleeding or significant pelvic pain or fever. No UTI symptoms. Denies history of known exposure to STD.  No LMP recorded. Patient has had an injection.  OBJECTIVE:  She appears well, afebrile. Urine dipstick: not done.  ASSESSMENT:  Vaginal Discharge  Vaginal Odor   PLAN:  GC, chlamydia, trichomonas, BVAG, CVAG probe sent to lab. Treatment: To be determined once lab results are received ROV prn if symptoms persist or worsen.

## 2021-04-09 LAB — CERVICOVAGINAL ANCILLARY ONLY
Bacterial Vaginitis (gardnerella): POSITIVE — AB
Candida Glabrata: NEGATIVE
Candida Vaginitis: POSITIVE — AB
Chlamydia: NEGATIVE
Comment: NEGATIVE
Comment: NEGATIVE
Comment: NEGATIVE
Comment: NEGATIVE
Comment: NEGATIVE
Comment: NORMAL
Neisseria Gonorrhea: NEGATIVE
Trichomonas: NEGATIVE

## 2021-04-10 ENCOUNTER — Other Ambulatory Visit: Payer: Self-pay | Admitting: Obstetrics

## 2021-04-10 DIAGNOSIS — B379 Candidiasis, unspecified: Secondary | ICD-10-CM

## 2021-04-10 DIAGNOSIS — B9689 Other specified bacterial agents as the cause of diseases classified elsewhere: Secondary | ICD-10-CM

## 2021-04-10 DIAGNOSIS — N760 Acute vaginitis: Secondary | ICD-10-CM

## 2021-04-10 MED ORDER — FLUCONAZOLE 150 MG PO TABS
150.0000 mg | ORAL_TABLET | Freq: Once | ORAL | 0 refills | Status: AC
Start: 2021-04-10 — End: 2021-04-10

## 2021-04-10 MED ORDER — METRONIDAZOLE 500 MG PO TABS: 500.0000 mg | ORAL_TABLET | Freq: Two times a day (BID) | ORAL | 0 refills | Status: DC

## 2021-04-12 ENCOUNTER — Ambulatory Visit: Payer: Medicaid Other

## 2021-04-23 ENCOUNTER — Telehealth: Payer: Self-pay | Admitting: Podiatry

## 2021-04-23 ENCOUNTER — Other Ambulatory Visit: Payer: Self-pay

## 2021-04-23 ENCOUNTER — Encounter: Payer: Self-pay | Admitting: Podiatry

## 2021-04-23 ENCOUNTER — Ambulatory Visit (INDEPENDENT_AMBULATORY_CARE_PROVIDER_SITE_OTHER): Payer: Medicaid Other | Admitting: Podiatry

## 2021-04-23 DIAGNOSIS — L600 Ingrowing nail: Secondary | ICD-10-CM | POA: Diagnosis not present

## 2021-04-23 DIAGNOSIS — B351 Tinea unguium: Secondary | ICD-10-CM

## 2021-04-23 NOTE — Patient Instructions (Signed)

## 2021-04-23 NOTE — Telephone Encounter (Signed)
Patient had an ingrown done today with Dr. Charlsie Merles. She complain of severe pain since the numbing meds wore off and she is bleeding though the bandages. She would like for someone to give her a call back,

## 2021-04-23 NOTE — Telephone Encounter (Signed)
Patient called and left a message on the nurse line that she is still having pain from the procedure. She has taken 2 extra strength tylenol and no help. Tonika- please call her back. If the bandage is too tight then she can remove it tonight and re-wrap it. Also if she is able to take ibuprofen she can alternate that with the tylenol.

## 2021-04-26 NOTE — Progress Notes (Signed)
Subjective:   Patient ID: Rachael Oliver, female   DOB: 30 y.o.   MRN: [id number]   HPI Patient states she has developed some loosening of her right third nail and its become very loose and she has some discoloration on other nails but localized.  Patient states she does not remember injury but this has occurred before and she is wearing polish today that is not removable.  Patient does not smoke likes to be active   Review of Systems  All other systems reviewed and are negative.      Objective:  Physical Exam Vitals and nursing note reviewed.  Constitutional:      Appearance: She is well-developed.  Pulmonary:     Effort: Pulmonary effort is normal.  Musculoskeletal:        General: Normal range of motion.  Skin:    General: Skin is warm.  Neurological:     Mental Status: She is alert.    Neurovascular status intact muscle strength found to be adequate range of motion found to be adequate.  Patient is noted to have a very thickened third nail right that is relatively loose and has other nails that also get thick.  Patient is wearing polish difficult to determine     Assessment:  Probability for trauma that is occurring more the problem with a completely loose third nail right damaged and other nails which appear to have some damage and possible fungal infiltration     Plan:  H&P all conditions reviewed and I recommended temporary removal of the third nail right as it is loose and I went ahead today and I infiltrated 60 mg like Marcaine mixture sterile prep of the toe done and using sterile instrumentation remove the nail clear that the bed it looked healthy and a new nail will regrow.  Patient will be seen back as needed

## 2021-05-20 ENCOUNTER — Encounter: Payer: Self-pay | Admitting: Podiatry

## 2021-05-28 ENCOUNTER — Ambulatory Visit: Payer: Medicaid Other | Admitting: Podiatry

## 2021-06-01 ENCOUNTER — Ambulatory Visit (INDEPENDENT_AMBULATORY_CARE_PROVIDER_SITE_OTHER): Payer: Medicaid Other

## 2021-06-01 ENCOUNTER — Other Ambulatory Visit (HOSPITAL_COMMUNITY)
Admission: RE | Admit: 2021-06-01 | Discharge: 2021-06-01 | Disposition: A | Discharge status: Home / Self Care | Payer: Medicaid Other | Source: Ambulatory Visit | Attending: Obstetrics & Gynecology | Admitting: Obstetrics & Gynecology

## 2021-06-01 ENCOUNTER — Other Ambulatory Visit: Payer: Self-pay

## 2021-06-01 VITALS — BP 112/69 | HR 75 | Wt 172.0 lb

## 2021-06-01 DIAGNOSIS — N898 Other specified noninflammatory disorders of vagina: Secondary | ICD-10-CM | POA: Insufficient documentation

## 2021-06-01 NOTE — Progress Notes (Signed)
SUBJECTIVE:  30 y.o. female complains of thick vaginal discharge for 4 day(s). Denies abnormal vaginal bleeding or significant pelvic pain or fever. No UTI symptoms. Denies history of known exposure to STD.  No LMP recorded. (Menstrual status: Irregular Periods).  OBJECTIVE:  She appears well, afebrile. Urine dipstick: not done.  ASSESSMENT:  Vaginal Discharge     PLAN:  GC, chlamydia, trichomonas, BVAG, CVAG probe sent to lab. Treatment: To be determined once lab results are received ROV prn if symptoms persist or worsen.

## 2021-06-02 ENCOUNTER — Ambulatory Visit (INDEPENDENT_AMBULATORY_CARE_PROVIDER_SITE_OTHER): Payer: Medicaid Other | Admitting: Podiatry

## 2021-06-02 ENCOUNTER — Encounter: Payer: Self-pay | Admitting: Podiatry

## 2021-06-02 DIAGNOSIS — M2041 Other hammer toe(s) (acquired), right foot: Secondary | ICD-10-CM | POA: Diagnosis not present

## 2021-06-02 DIAGNOSIS — B351 Tinea unguium: Secondary | ICD-10-CM

## 2021-06-02 DIAGNOSIS — M2042 Other hammer toe(s) (acquired), left foot: Secondary | ICD-10-CM | POA: Diagnosis not present

## 2021-06-02 LAB — CERVICOVAGINAL ANCILLARY ONLY
Bacterial Vaginitis (gardnerella): NEGATIVE
Candida Glabrata: NEGATIVE
Candida Vaginitis: NEGATIVE
Chlamydia: NEGATIVE
Comment: NEGATIVE
Comment: NEGATIVE
Comment: NEGATIVE
Comment: NEGATIVE
Comment: NEGATIVE
Comment: NORMAL
Neisseria Gonorrhea: NEGATIVE
Trichomonas: NEGATIVE

## 2021-06-02 NOTE — Progress Notes (Signed)
Subjective:   Patient ID: Rachael Oliver, female   DOB: 30 y.o.   MRN: [id number]   HPI Patient presents stating that she was concerned about the condition of her nail and also has digital deformities on the second toe with small callus formations   ROS      Objective:  Physical Exam  Neurovascular status intact with nail disease with nail that has regrown second right which appears to be in a good pattern and does present with pictures today showing me discoloration pattern along with digital deformity of the second toe bilateral     Assessment:  Patient had pathology done which I reviewed with her today indicating no fungal component appears to be strictly trauma with thickness of the nailbeds and digital deformity     Plan:  H&P reviewed her pathology and do not recommend current treatment as there is no active fungus and no indications of melanoma or any other pathology.  Do not recommend hammertoe correction but I did discuss structural deformity and consideration for treatment in future

## 2021-08-17 ENCOUNTER — Other Ambulatory Visit (HOSPITAL_COMMUNITY)
Admission: RE | Admit: 2021-08-17 | Discharge: 2021-08-17 | Disposition: A | Discharge status: Home / Self Care | Payer: Medicaid Other | Source: Ambulatory Visit

## 2021-08-17 ENCOUNTER — Ambulatory Visit (INDEPENDENT_AMBULATORY_CARE_PROVIDER_SITE_OTHER): Payer: Medicaid Other

## 2021-08-17 ENCOUNTER — Other Ambulatory Visit: Payer: Self-pay

## 2021-08-17 VITALS — BP 107/73 | HR 76 | Ht 64.0 in | Wt 173.0 lb

## 2021-08-17 DIAGNOSIS — Z01419 Encounter for gynecological examination (general) (routine) without abnormal findings: Secondary | ICD-10-CM | POA: Insufficient documentation

## 2021-08-17 DIAGNOSIS — Z113 Encounter for screening for infections with a predominantly sexual mode of transmission: Secondary | ICD-10-CM | POA: Insufficient documentation

## 2021-08-17 NOTE — Progress Notes (Signed)
   Subjective:     Rachael Oliver is a 30 y.o. female here at [facility] for follow up pap after LEEP.  Current complaints: none.  Personal health questionnaire reviewed: yes.  Do you have a primary care provider? yes Do you feel safe at home? yes   Health Maintenance Due  Topic Date Due   INFLUENZA VACCINE  Never done     Risk factors for chronic health problems: Smoking: denies Alchohol/how much: denies Pt BMI: Body mass index is 29.7 kg/m.   Gynecologic History No LMP recorded. (Menstrual status: Irregular Periods). Contraception: none Last Pap: 01/2020. Results were: abnormal; s/p leep 02/2021 Last mammogram: n/a  Obstetric History OB History  Gravida Para Term Preterm AB Living  5 4 2 2 1 4   SAB IAB Ectopic Multiple Live Births  1     0 4    # Outcome Date GA Lbr Len/2nd Weight Sex Delivery Anes PTL Lv  5 Preterm 02/07/19 [redacted]w[redacted]d  5 lb 7.1 oz (2.469 kg) M Vag-Spont EPI  LIV     Birth Comments: N/A  4 SAB 03/29/18 [redacted]w[redacted]d         3 Term 03/15/14 [redacted]w[redacted]d 02:20 / 00:15 7 lb 12.2 oz (3.52 kg) M Vag-Spont EPI  LIV  2 Preterm 2014 [redacted]w[redacted]d    Vag-Spont   LIV  1 Term 2011 [redacted]w[redacted]d    Vag-Spont   LIV   The following portions of the patient's history were reviewed and updated as appropriate: allergies, current medications, past family history, past medical history, past social history, past surgical history, and problem list.  Review of Systems Pertinent items are noted in HPI.    Objective:   BP 107/73   Pulse 76   Ht 5\' 4"  (1.626 m)   Wt 173 lb (78.5 kg)   BMI 29.70 kg/m  VS reviewed, nursing note reviewed,  Constitutional: well developed, well nourished, no distress HEENT: normocephalic CV: normal rate Pulm/chest wall: normal effort Breast Exam:  Deferred with low risks and shared decision making, discussed recommendation to start mammogram between 40-50 yo/ exam Abdomen: soft Neuro: alert and oriented x 3 Skin: warm, dry Psych: affect normal Pelvic exam: Performed:  Cervix pink, visually closed, without lesion, scant white creamy discharge, vaginal walls and external genitalia normal Bimanual exam: Cervix 0/long/high, firm, anterior, neg CMT, uterus nontender, nonenlarged, adnexa without tenderness, enlargement, or mass     Assessment/Plan:   1. Screen for STD (sexually transmitted disease) - Requesting STD screening  - Cervicovaginal ancillary only( [place]) - HIV Antibody (routine testing w rflx) - Hepatitis C Antibody - Hepatitis B Surface AntiGEN - RPR  2. Well woman exam with routine gynecological exam  - Cytology - PAP( [place])    Follow up in: 1  year  or as needed.    [redacted]w[redacted]d, CNM 08/17/21 3:36 PM

## 2021-08-17 NOTE — Progress Notes (Addendum)
GYN presents for Repeat PAP.  Patient requested STD Swabs.

## 2021-08-18 LAB — RPR: RPR Ser Ql: NONREACTIVE

## 2021-08-18 LAB — HEPATITIS C ANTIBODY: Hep C Virus Ab: <0.1 s/co ratio (ref 0.0–0.9)

## 2021-08-18 LAB — HEPATITIS B SURFACE ANTIGEN: Hepatitis B Surface Ag: NEGATIVE

## 2021-08-18 LAB — HIV ANTIBODY (ROUTINE TESTING W REFLEX): HIV Screen 4th Generation wRfx: NONREACTIVE

## 2021-08-19 LAB — CERVICOVAGINAL ANCILLARY ONLY
Bacterial Vaginitis (gardnerella): NEGATIVE
Candida Glabrata: NEGATIVE
Candida Vaginitis: NEGATIVE
Chlamydia: NEGATIVE
Comment: NEGATIVE
Comment: NEGATIVE
Comment: NEGATIVE
Comment: NEGATIVE
Comment: NEGATIVE
Comment: NORMAL
Neisseria Gonorrhea: NEGATIVE
Trichomonas: NEGATIVE

## 2021-08-23 LAB — CYTOLOGY - PAP: Diagnosis: NEGATIVE

## 2021-09-11 ENCOUNTER — Other Ambulatory Visit: Payer: Self-pay

## 2021-09-11 DIAGNOSIS — B9689 Other specified bacterial agents as the cause of diseases classified elsewhere: Secondary | ICD-10-CM

## 2021-09-13 ENCOUNTER — Encounter: Payer: Self-pay | Admitting: *Deleted

## 2021-09-13 ENCOUNTER — Other Ambulatory Visit: Payer: Self-pay | Admitting: *Deleted

## 2021-09-13 DIAGNOSIS — B9689 Other specified bacterial agents as the cause of diseases classified elsewhere: Secondary | ICD-10-CM

## 2021-09-13 DIAGNOSIS — N760 Acute vaginitis: Secondary | ICD-10-CM

## 2021-09-13 MED ORDER — METRONIDAZOLE 500 MG PO TABS: 500.0000 mg | ORAL_TABLET | Freq: Two times a day (BID) | ORAL | 0 refills | Status: DC

## 2021-09-14 ENCOUNTER — Other Ambulatory Visit (HOSPITAL_COMMUNITY)
Admission: RE | Admit: 2021-09-14 | Discharge: 2021-09-14 | Disposition: A | Discharge status: Home / Self Care | Payer: Medicaid Other | Source: Ambulatory Visit | Attending: Obstetrics and Gynecology | Admitting: Obstetrics and Gynecology

## 2021-09-14 ENCOUNTER — Other Ambulatory Visit: Payer: Self-pay

## 2021-09-14 ENCOUNTER — Ambulatory Visit (INDEPENDENT_AMBULATORY_CARE_PROVIDER_SITE_OTHER): Payer: Medicaid Other

## 2021-09-14 VITALS — BP 119/71 | HR 66 | Ht 64.0 in | Wt 175.0 lb

## 2021-09-14 DIAGNOSIS — N912 Amenorrhea, unspecified: Secondary | ICD-10-CM | POA: Diagnosis not present

## 2021-09-14 DIAGNOSIS — N898 Other specified noninflammatory disorders of vagina: Secondary | ICD-10-CM

## 2021-09-14 LAB — POCT URINE PREGNANCY: Preg Test, Ur: NEGATIVE

## 2021-09-14 NOTE — Progress Notes (Signed)
SUBJECTIVE:  30 y.o. female complains of malodorous vaginal discharge for 5 day(s). Denies abnormal vaginal bleeding or significant pelvic pain or fever. No UTI symptoms. Denies history of known exposure to STD.  No LMP recorded. (Menstrual status: Irregular Periods). Pt last cycle in March 2022.  OBJECTIVE:  She appears well, afebrile. Urine dipstick: not done.  ASSESSMENT:  Vaginal Discharge  Vaginal Odor   PLAN:  GC, chlamydia, trichomonas, BVAG, CVAG probe sent to lab. UPT today.  Treatment: To be determined once lab results are received ROV prn if symptoms persist or worsen.

## 2021-09-15 LAB — CERVICOVAGINAL ANCILLARY ONLY
Bacterial Vaginitis (gardnerella): POSITIVE — AB
Candida Glabrata: NEGATIVE
Candida Vaginitis: NEGATIVE
Chlamydia: NEGATIVE
Comment: NEGATIVE
Comment: NEGATIVE
Comment: NEGATIVE
Comment: NEGATIVE
Comment: NEGATIVE
Comment: NORMAL
Neisseria Gonorrhea: NEGATIVE
Trichomonas: NEGATIVE

## 2021-10-26 ENCOUNTER — Ambulatory Visit: Payer: Medicaid Other

## 2021-11-22 ENCOUNTER — Ambulatory Visit (INDEPENDENT_AMBULATORY_CARE_PROVIDER_SITE_OTHER): Payer: Medicaid Other | Admitting: Advanced Practice Midwife

## 2021-11-22 ENCOUNTER — Other Ambulatory Visit (HOSPITAL_COMMUNITY)
Admission: RE | Admit: 2021-11-22 | Discharge: 2021-11-22 | Disposition: A | Discharge status: Home / Self Care | Payer: Medicaid Other | Source: Ambulatory Visit | Attending: Advanced Practice Midwife | Admitting: Advanced Practice Midwife

## 2021-11-22 ENCOUNTER — Ambulatory Visit: Payer: Medicaid Other

## 2021-11-22 ENCOUNTER — Other Ambulatory Visit: Payer: Self-pay

## 2021-11-22 ENCOUNTER — Encounter: Payer: Self-pay | Admitting: Advanced Practice Midwife

## 2021-11-22 VITALS — BP 115/77 | HR 83 | Wt 177.0 lb

## 2021-11-22 DIAGNOSIS — N926 Irregular menstruation, unspecified: Secondary | ICD-10-CM

## 2021-11-22 DIAGNOSIS — Z113 Encounter for screening for infections with a predominantly sexual mode of transmission: Secondary | ICD-10-CM

## 2021-11-22 LAB — POCT URINE PREGNANCY: Preg Test, Ur: NEGATIVE

## 2021-11-22 NOTE — Progress Notes (Signed)
Pt left office without being seen by provider.

## 2021-11-22 NOTE — Progress Notes (Signed)
Pt reports bilateral nipple soreness x 2 weeks.  Denies breast lumps/bumps Pt requests all STD testing.  Negative pap 08/17/21 UPT negative

## 2021-11-24 LAB — CERVICOVAGINAL ANCILLARY ONLY
Bacterial Vaginitis (gardnerella): POSITIVE — AB
Candida Glabrata: NEGATIVE
Candida Vaginitis: NEGATIVE
Chlamydia: NEGATIVE
Comment: NEGATIVE
Comment: NEGATIVE
Comment: NEGATIVE
Comment: NEGATIVE
Comment: NEGATIVE
Comment: NORMAL
Neisseria Gonorrhea: NEGATIVE
Trichomonas: NEGATIVE

## 2021-11-26 ENCOUNTER — Telehealth: Payer: Self-pay

## 2021-11-26 NOTE — Telephone Encounter (Signed)
Attempted to contact about results and possible need for rx, based on provider's message. No answer, vm is full unable to leave a vm

## 2021-11-29 ENCOUNTER — Telehealth: Payer: Self-pay

## 2021-11-29 MED ORDER — METRONIDAZOLE 500 MG PO TABS: 500.0000 mg | ORAL_TABLET | Freq: Two times a day (BID) | ORAL | 0 refills | Status: DC

## 2021-11-29 NOTE — Telephone Encounter (Signed)
S/w patient and she confirmed that she is having vaginal discharge and odor. Rx sent per provider order. Pt states that she will call back to reschedule appt for breast symptoms

## 2021-12-31 ENCOUNTER — Other Ambulatory Visit (HOSPITAL_COMMUNITY)
Admission: RE | Admit: 2021-12-31 | Discharge: 2021-12-31 | Disposition: A | Discharge status: Home / Self Care | Payer: Medicaid Other | Source: Ambulatory Visit | Attending: Advanced Practice Midwife | Admitting: Advanced Practice Midwife

## 2021-12-31 ENCOUNTER — Other Ambulatory Visit: Payer: Self-pay

## 2021-12-31 ENCOUNTER — Ambulatory Visit (INDEPENDENT_AMBULATORY_CARE_PROVIDER_SITE_OTHER): Payer: Medicaid Other

## 2021-12-31 VITALS — BP 109/72 | HR 78 | Ht 64.0 in | Wt 178.0 lb

## 2021-12-31 DIAGNOSIS — Z3042 Encounter for surveillance of injectable contraceptive: Secondary | ICD-10-CM | POA: Diagnosis not present

## 2021-12-31 DIAGNOSIS — Z113 Encounter for screening for infections with a predominantly sexual mode of transmission: Secondary | ICD-10-CM | POA: Insufficient documentation

## 2021-12-31 LAB — POCT URINE PREGNANCY: Preg Test, Ur: NEGATIVE

## 2021-12-31 MED ORDER — MEDROXYPROGESTERONE ACETATE 150 MG/ML IM SUSP: 150.0000 mg | INTRAMUSCULAR | 4 refills | Status: DC

## 2021-12-31 NOTE — Progress Notes (Addendum)
SUBJECTIVE:  31 y.o. female presents for 1st. UPT to restart DEPO, c/o white vaginal discharge for 5 day(s). Denies abnormal vaginal bleeding or significant pelvic pain or fever. No UTI symptoms.  No LMP recorded. (Menstrual status: Irregular Periods).  OBJECTIVE:  She appears well, afebrile. Urine dipstick: not done.  ASSESSMENT:  UPT Negative Today Vaginal Discharge  Vaginal Odor   PLAN:  RTO in 2 weeks for 2nd. UPT/DEPO Injection.  Pick up Rx from Pharmacy and take to appt. GC, chlamydia, trichomonas, BVAG, CVAG probe sent to lab. Treatment: To be determined once lab results are received ROV prn if symptoms persist or worsen.   Patient was assessed and managed by nursing staff during this encounter. I have reviewed the chart and agree with the documentation and plan. I have also made any necessary editorial changes.  Coral Ceo, MD 01/11/2022 2:31 PM

## 2022-01-03 ENCOUNTER — Other Ambulatory Visit: Payer: Self-pay | Admitting: Obstetrics

## 2022-01-03 DIAGNOSIS — N760 Acute vaginitis: Secondary | ICD-10-CM

## 2022-01-03 DIAGNOSIS — B9689 Other specified bacterial agents as the cause of diseases classified elsewhere: Secondary | ICD-10-CM

## 2022-01-03 LAB — CERVICOVAGINAL ANCILLARY ONLY
Bacterial Vaginitis (gardnerella): POSITIVE — AB
Candida Glabrata: NEGATIVE
Candida Vaginitis: NEGATIVE
Chlamydia: NEGATIVE
Comment: NEGATIVE
Comment: NEGATIVE
Comment: NEGATIVE
Comment: NEGATIVE
Comment: NEGATIVE
Comment: NORMAL
Neisseria Gonorrhea: NEGATIVE
Trichomonas: NEGATIVE

## 2022-01-03 MED ORDER — METRONIDAZOLE 500 MG PO TABS: 500.0000 mg | ORAL_TABLET | Freq: Two times a day (BID) | ORAL | 0 refills | Status: DC

## 2022-01-14 ENCOUNTER — Ambulatory Visit (INDEPENDENT_AMBULATORY_CARE_PROVIDER_SITE_OTHER): Payer: Medicaid Other

## 2022-01-14 ENCOUNTER — Other Ambulatory Visit: Payer: Self-pay

## 2022-01-14 DIAGNOSIS — Z3042 Encounter for surveillance of injectable contraceptive: Secondary | ICD-10-CM

## 2022-01-14 LAB — POCT URINE PREGNANCY: Preg Test, Ur: NEGATIVE

## 2022-01-14 NOTE — Progress Notes (Addendum)
Pt is in the office for 2nd UPT and depo restart. ?UPT is negative in office today. Depo administered in RUOQ and pt tolerated well. Next due May 26-June 9 ?Marland KitchenMarland Kitchen ?Administrations This Visit   ? ? medroxyPROGESTERone (DEPO-PROVERA) injection 150 mg   ? ? Admin Date ?01/14/2022 Action ?Given Dose ?150 mg Route ?Intramuscular Administered By ?Katrina Stack, RN  ? ?  ?  ? ?  ?  ? ?Natale Milch, RN ? ? ?Patient was assessed and managed by nursing staff during this encounter. I have reviewed the chart and agree with the documentation and plan. I have also made any necessary editorial changes. ? ?Jaynie Collins, MD ?01/14/2022 9:46 AM  ? ?

## 2022-02-07 ENCOUNTER — Ambulatory Visit: Payer: Medicaid Other

## 2022-03-07 ENCOUNTER — Other Ambulatory Visit (HOSPITAL_COMMUNITY)
Admission: RE | Admit: 2022-03-07 | Discharge: 2022-03-07 | Disposition: A | Discharge status: Home / Self Care | Payer: Medicaid Other | Source: Ambulatory Visit | Attending: Obstetrics and Gynecology | Admitting: Obstetrics and Gynecology

## 2022-03-07 ENCOUNTER — Ambulatory Visit (INDEPENDENT_AMBULATORY_CARE_PROVIDER_SITE_OTHER): Payer: Medicaid Other | Admitting: Emergency Medicine

## 2022-03-07 VITALS — BP 126/76 | HR 67 | Ht 64.0 in | Wt 175.0 lb

## 2022-03-07 DIAGNOSIS — N926 Irregular menstruation, unspecified: Secondary | ICD-10-CM | POA: Diagnosis not present

## 2022-03-07 DIAGNOSIS — R300 Dysuria: Secondary | ICD-10-CM | POA: Diagnosis not present

## 2022-03-07 DIAGNOSIS — Z113 Encounter for screening for infections with a predominantly sexual mode of transmission: Secondary | ICD-10-CM | POA: Diagnosis not present

## 2022-03-07 LAB — POCT URINALYSIS DIPSTICK
Bilirubin, UA: NEGATIVE
Blood, UA: NEGATIVE
Glucose, UA: NEGATIVE
Ketones, UA: NEGATIVE
Leukocytes, UA: NEGATIVE
Nitrite, UA: NEGATIVE
Protein, UA: NEGATIVE
Spec Grav, UA: 1.015 (ref 1.010–1.025)
Urobilinogen, UA: 0.2 E.U./dL
pH, UA: 6.5 (ref 5.0–8.0)

## 2022-03-07 LAB — POCT URINE PREGNANCY: Preg Test, Ur: NEGATIVE

## 2022-03-07 NOTE — Progress Notes (Signed)
Patient was assessed and managed by nursing staff during this encounter. I have reviewed the chart and agree with the documentation and plan. I have also made any necessary editorial changes. ? ?Mora Bellman, MD ?03/07/2022 8:02 PM  ? ?

## 2022-03-07 NOTE — Progress Notes (Signed)
Rachael Oliver presents today for UPT. She has no unusual complaints. ? ?LMP: Depo Injection ?   ?OBJECTIVE: Appears well, in no apparent distress.  ? ?Home UPT Result: NA ?In-Office UPT result: Negative  ?I have reviewed the patient's medical, obstetrical, social, and family histories, and medications.  ? ?ASSESSMENT:Negative pregnancy test ? ?PLAN: Requests vaginal swab and urine dip. Reports urinary frequency and urgency.  ?

## 2022-03-08 LAB — CERVICOVAGINAL ANCILLARY ONLY
Bacterial Vaginitis (gardnerella): NEGATIVE
Candida Glabrata: NEGATIVE
Candida Vaginitis: POSITIVE — AB
Chlamydia: NEGATIVE
Comment: NEGATIVE
Comment: NEGATIVE
Comment: NEGATIVE
Comment: NEGATIVE
Comment: NEGATIVE
Comment: NORMAL
Neisseria Gonorrhea: NEGATIVE
Trichomonas: NEGATIVE

## 2022-03-09 MED ORDER — FLUCONAZOLE 150 MG PO TABS: 150.0000 mg | ORAL_TABLET | Freq: Once | ORAL | 0 refills | Status: AC

## 2022-03-09 NOTE — Addendum Note (Signed)
Addended by: Catalina Antigua on: 03/09/2022 09:23 AM ? ? Modules accepted: Orders ? ?

## 2022-03-10 LAB — URINE CULTURE

## 2022-03-15 MED ORDER — NITROFURANTOIN MONOHYD MACRO 100 MG PO CAPS: 100.0000 mg | ORAL_CAPSULE | Freq: Two times a day (BID) | ORAL | 0 refills | Status: DC

## 2022-03-15 NOTE — Addendum Note (Signed)
Addended by: Mora Bellman on: 03/15/2022 09:53 AM ? ? Modules accepted: Orders ? ?

## 2022-04-05 ENCOUNTER — Ambulatory Visit (INDEPENDENT_AMBULATORY_CARE_PROVIDER_SITE_OTHER): Payer: Medicaid Other

## 2022-04-05 ENCOUNTER — Ambulatory Visit (HOSPITAL_COMMUNITY)
Admission: EM | Admit: 2022-04-05 | Discharge: 2022-04-05 | Disposition: A | Discharge status: Home / Self Care | Payer: Medicaid Other | Attending: Family Medicine | Admitting: Family Medicine

## 2022-04-05 ENCOUNTER — Ambulatory Visit: Payer: Medicaid Other

## 2022-04-05 ENCOUNTER — Encounter (HOSPITAL_COMMUNITY): Payer: Self-pay

## 2022-04-05 DIAGNOSIS — M25532 Pain in left wrist: Secondary | ICD-10-CM

## 2022-04-05 MED ORDER — DICLOFENAC SODIUM 75 MG PO TBEC: 75.0000 mg | DELAYED_RELEASE_TABLET | Freq: Two times a day (BID) | ORAL | 0 refills | Status: AC

## 2022-04-05 NOTE — Discharge Instructions (Signed)
Wear your splint for the next 5-7 days.

## 2022-04-05 NOTE — ED Triage Notes (Signed)
Left wrist pain ongoing for a week. Patient was moving the kids bed that has cinder blocks and this fell on her wrist. Was swollen but has gone down.

## 2022-04-06 NOTE — ED Provider Notes (Signed)
[facility]   [id number] 04/05/22 Arrival Time: 1342  ASSESSMENT & PLAN:  1. Left wrist pain    I have personally viewed the imaging studies ordered this visit. No wrist fx appreciated.  Begin trial of: Discharge Medication List as of 04/05/2022  3:50 PM     START taking these medications   Details  diclofenac (VOLTAREN) 75 MG EC tablet Take 1 tablet (75 mg total) by mouth 2 (two) times daily., Starting Tue 04/05/2022, Normal       Wrist brace for comfort.  Orders Placed This Encounter  Procedures   DG Wrist Complete Left   Apply Wrist brace    Recommend:  Follow-up Information     [facility].   Why: If worsening or failing to improve as anticipated. Contact information: [address] [place] [address] [phone]               Reviewed expectations re: course of current medical issues. Questions answered. Outlined signs and symptoms indicating need for more acute intervention. Patient verbalized understanding. After Visit Summary given.  SUBJECTIVE: History from: patient. Rachael Oliver is a 30 y.o. female who reports L wrist pain; approx one week; reports bed fell onto wrist while attempting to move. Initial swelling; now improved. Pain still present but not worse. No extremity sensation changes or weakness. No tx PTA.  Past Surgical History:  Procedure Laterality Date   ELBOW SURGERY Left    FOOT SURGERY     GYNECOLOGIC CRYOSURGERY     I & D EXTREMITY Left 12/03/2015   Procedure: IRRIGATION AND DEBRIDEMENT LEFT ELBOW;  Surgeon: Tarry Kos, MD;  Location: [facility] OR;  Service: Orthopedics;  Laterality: Left;   uterine biopsy        OBJECTIVE:  Vitals:   04/05/22 1506 04/05/22 1507  BP:  119/70  Pulse:  67  Resp:  16  Temp:  98.2 F (36.8 C)  TempSrc:  Oral  SpO2:  100%  Weight: 79.4 kg   Height: 5\' 4"  (1.626 m)     General appearance: alert; no distress HEENT: [place]; AT Neck:  supple with FROM Resp: unlabored respirations Extremities: LUE: warm with well perfused appearance; poorly localized moderate tenderness over left dorsal wrist; without gross deformities; swelling: none; bruising: none; wrist ROM: normal CV: brisk extremity capillary refill of LUE; 2+ radial pulse of LUE. Skin: warm and dry; no visible rashes Neurologic: gait normal; normal sensation and strength of LUE Psychological: alert and cooperative; normal mood and affect  Imaging: DG Wrist Complete Left  Result Date: 04/05/2022 CLINICAL DATA:  Wrist pain for 1 week following injury. Improving swelling. EXAM: LEFT WRIST - COMPLETE 3+ VIEW COMPARISON:  None Available. FINDINGS: The mineralization and alignment are normal. There is no evidence of acute fracture or dislocation. The joint spaces are preserved. Lunotriquetral coalition noted incidentally. The soft tissues appear unremarkable. IMPRESSION: Normal examination aside from incidental lunotriquetral coalition. Electronically Signed   By: 04/07/2022 M.D.   On: 04/05/2022 15:39      No Known Allergies  Past Medical History:  Diagnosis Date   Asthma    as a child   History of methicillin resistant staphylococcus aureus (MRSA)    Infection    left elbow   Preterm labor    Syncope    Syncope    once monthly with and without preg. Started approx in 2016   Trichomonosis    Vaginal Pap smear, abnormal  Social History   Socioeconomic History   Marital status: Single    Spouse name: Not on file   Number of children: Not on file   Years of education: Not on file   Highest education level: Not on file  Occupational History   Not on file  Tobacco Use   Smoking status: Never   Smokeless tobacco: Never  Vaping Use   Vaping Use: Never used  Substance and Sexual Activity   Alcohol use: No    Alcohol/week: 0.0 standard drinks   Drug use: No   Sexual activity: Not Currently    Partners: Male    Birth control/protection: None   Other Topics Concern   Not on file  Social History Narrative   Not on file   Social Determinants of Health   Financial Resource Strain: Not on file  Food Insecurity: Not on file  Transportation Needs: Not on file  Physical Activity: Not on file  Stress: Not on file  Social Connections: Not on file   Family History  Problem Relation Age of Onset   Diabetes Mother    Alcohol abuse Neg Hx    Arthritis Neg Hx    Asthma Neg Hx    Birth defects Neg Hx    COPD Neg Hx    Cancer Neg Hx    Depression Neg Hx    Drug abuse Neg Hx    Early death Neg Hx    Hearing loss Neg Hx    Heart disease Neg Hx    Hyperlipidemia Neg Hx    Hypertension Neg Hx    Kidney disease Neg Hx    Learning disabilities Neg Hx    Mental illness Neg Hx    Mental retardation Neg Hx    Miscarriages / Stillbirths Neg Hx    Stroke Neg Hx    Vision loss Neg Hx    Past Surgical History:  Procedure Laterality Date   ELBOW SURGERY Left    FOOT SURGERY     GYNECOLOGIC CRYOSURGERY     I & D EXTREMITY Left 12/03/2015   Procedure: IRRIGATION AND DEBRIDEMENT LEFT ELBOW;  Surgeon: Tarry Kos, MD;  Location: [facility] OR;  Service: Orthopedics;  Laterality: Left;   uterine biopsy         Mardella Layman, MD 04/06/22 [phone]

## 2022-04-13 ENCOUNTER — Other Ambulatory Visit (HOSPITAL_COMMUNITY)
Admission: RE | Admit: 2022-04-13 | Discharge: 2022-04-13 | Disposition: A | Discharge status: Home / Self Care | Payer: Medicaid Other | Source: Ambulatory Visit | Attending: Obstetrics and Gynecology | Admitting: Obstetrics and Gynecology

## 2022-04-13 ENCOUNTER — Ambulatory Visit (INDEPENDENT_AMBULATORY_CARE_PROVIDER_SITE_OTHER): Payer: Medicaid Other

## 2022-04-13 DIAGNOSIS — Z113 Encounter for screening for infections with a predominantly sexual mode of transmission: Secondary | ICD-10-CM | POA: Diagnosis present

## 2022-04-13 NOTE — Progress Notes (Cosign Needed)
Patient presents for STD testing including yeast and bv. Patient denies having any sx at this time and just wants to be tested. No other concern. Patient advised that it may take 24-48 hours for results to return. Self swab completed.

## 2022-04-14 LAB — CERVICOVAGINAL ANCILLARY ONLY
Bacterial Vaginitis (gardnerella): POSITIVE — AB
Candida Glabrata: NEGATIVE
Candida Vaginitis: NEGATIVE
Chlamydia: NEGATIVE
Comment: NEGATIVE
Comment: NEGATIVE
Comment: NEGATIVE
Comment: NEGATIVE
Comment: NEGATIVE
Comment: NORMAL
Neisseria Gonorrhea: NEGATIVE
Trichomonas: NEGATIVE

## 2022-04-14 LAB — RPR: RPR Ser Ql: NONREACTIVE

## 2022-04-14 LAB — HEPATITIS C ANTIBODY: Hep C Virus Ab: NONREACTIVE

## 2022-04-14 LAB — HEPATITIS B SURFACE ANTIGEN: Hepatitis B Surface Ag: NEGATIVE

## 2022-04-14 LAB — HIV ANTIBODY (ROUTINE TESTING W REFLEX): HIV Screen 4th Generation wRfx: NONREACTIVE

## 2022-04-15 ENCOUNTER — Other Ambulatory Visit: Payer: Self-pay | Admitting: Emergency Medicine

## 2022-04-15 DIAGNOSIS — B9689 Other specified bacterial agents as the cause of diseases classified elsewhere: Secondary | ICD-10-CM

## 2022-04-15 MED ORDER — METRONIDAZOLE 500 MG PO TABS: 500.0000 mg | ORAL_TABLET | Freq: Two times a day (BID) | ORAL | 0 refills | Status: DC

## 2022-04-15 NOTE — Progress Notes (Signed)
Flagyl Rx sent for BV.

## 2022-05-09 ENCOUNTER — Encounter: Payer: Self-pay | Admitting: Advanced Practice Midwife

## 2022-05-09 ENCOUNTER — Other Ambulatory Visit: Payer: Self-pay | Admitting: *Deleted

## 2022-05-09 MED ORDER — FLUCONAZOLE 150 MG PO TABS: 150.0000 mg | ORAL_TABLET | Freq: Once | ORAL | 0 refills | Status: AC

## 2022-05-09 NOTE — Progress Notes (Signed)
Diflucan sent for yeast after antibiotic use.  Pt aware.

## 2022-05-11 ENCOUNTER — Other Ambulatory Visit (HOSPITAL_COMMUNITY)
Admission: RE | Admit: 2022-05-11 | Discharge: 2022-05-11 | Disposition: A | Discharge status: Home / Self Care | Payer: Medicaid Other | Source: Ambulatory Visit | Attending: Obstetrics and Gynecology | Admitting: Obstetrics and Gynecology

## 2022-05-11 ENCOUNTER — Ambulatory Visit (INDEPENDENT_AMBULATORY_CARE_PROVIDER_SITE_OTHER): Payer: Medicaid Other | Admitting: Emergency Medicine

## 2022-05-11 VITALS — BP 111/75 | HR 65 | Wt 162.5 lb

## 2022-05-11 DIAGNOSIS — Z3042 Encounter for surveillance of injectable contraceptive: Secondary | ICD-10-CM

## 2022-05-11 DIAGNOSIS — N898 Other specified noninflammatory disorders of vagina: Secondary | ICD-10-CM

## 2022-05-11 LAB — POCT URINE PREGNANCY: Preg Test, Ur: NEGATIVE

## 2022-05-11 NOTE — Progress Notes (Signed)
Patient was assessed and managed by nursing staff during this encounter. I have reviewed the chart and agree with the documentation and plan. I have also made any necessary editorial changes.  Quantrell Splitt A Rochell Puett, MD 05/11/2022 12:49 PM   

## 2022-05-11 NOTE — Progress Notes (Signed)
SUBJECTIVE:  31 y.o. female complains of curd-like vaginal discharge for 3 day(s). Denies abnormal vaginal bleeding or significant pelvic pain or fever. No UTI symptoms. Denies history of known exposure to STD.  No LMP recorded. (Menstrual status: Irregular Periods).  OBJECTIVE:  She appears well, afebrile. Urine dipstick: not done.  ASSESSMENT:  Vaginal Discharge - thick, chunky Vaginal Odor- denies   PLAN:  GC, chlamydia, trichomonas, BVAG, CVAG probe sent to lab. Treatment: To be determined once lab results are received ROV prn if symptoms persist or worsen.   Pt desires to restart DEPO. Last Depo 01/14/22- UPT collected today, NEGATIVE. Instructed to return in 2 weeks for repeat UPT and depo restart.

## 2022-05-12 ENCOUNTER — Other Ambulatory Visit: Payer: Self-pay | Admitting: *Deleted

## 2022-05-12 DIAGNOSIS — B9689 Other specified bacterial agents as the cause of diseases classified elsewhere: Secondary | ICD-10-CM

## 2022-05-12 LAB — CERVICOVAGINAL ANCILLARY ONLY
Bacterial Vaginitis (gardnerella): POSITIVE — AB
Candida Glabrata: NEGATIVE
Candida Vaginitis: POSITIVE — AB
Chlamydia: NEGATIVE
Comment: NEGATIVE
Comment: NEGATIVE
Comment: NEGATIVE
Comment: NEGATIVE
Comment: NEGATIVE
Comment: NORMAL
Neisseria Gonorrhea: NEGATIVE
Trichomonas: NEGATIVE

## 2022-05-12 MED ORDER — METRONIDAZOLE 500 MG PO TABS: 500.0000 mg | ORAL_TABLET | Freq: Two times a day (BID) | ORAL | 0 refills | Status: DC

## 2022-05-12 NOTE — Progress Notes (Signed)
See lab results Flagyls sent today

## 2022-05-25 ENCOUNTER — Ambulatory Visit: Payer: Medicaid Other

## 2022-05-31 NOTE — Progress Notes (Signed)
Patient was assessed and managed by nursing staff during this encounter. I have reviewed the chart and agree with the documentation and plan. I have also made any necessary editorial changes.  Robbye Dede, MD 05/31/2022 3:09 PM   

## 2022-06-09 ENCOUNTER — Other Ambulatory Visit (HOSPITAL_COMMUNITY)
Admission: RE | Admit: 2022-06-09 | Discharge: 2022-06-09 | Disposition: A | Discharge status: Home / Self Care | Payer: Medicaid Other | Source: Ambulatory Visit | Attending: Obstetrics & Gynecology | Admitting: Obstetrics & Gynecology

## 2022-06-09 ENCOUNTER — Ambulatory Visit (INDEPENDENT_AMBULATORY_CARE_PROVIDER_SITE_OTHER): Payer: Medicaid Other

## 2022-06-09 DIAGNOSIS — Z30013 Encounter for initial prescription of injectable contraceptive: Secondary | ICD-10-CM

## 2022-06-09 DIAGNOSIS — Z113 Encounter for screening for infections with a predominantly sexual mode of transmission: Secondary | ICD-10-CM | POA: Diagnosis present

## 2022-06-09 LAB — POCT URINE PREGNANCY: Preg Test, Ur: NEGATIVE

## 2022-06-09 NOTE — Progress Notes (Cosign Needed)
Patient presents for STD screening and depo restart. Patient denies having any vaginal issues at this time. She would also like to be rechecked for yeast and bv. This is the first upt for depo restart. Patient advised to return in 2 weeks for her 2nd upt. Also advised to abstain from sexual intercourse for the 2 week waiting period.

## 2022-06-10 LAB — CERVICOVAGINAL ANCILLARY ONLY
Bacterial Vaginitis (gardnerella): NEGATIVE
Candida Glabrata: NEGATIVE
Candida Vaginitis: NEGATIVE
Chlamydia: NEGATIVE
Comment: NEGATIVE
Comment: NEGATIVE
Comment: NEGATIVE
Comment: NEGATIVE
Comment: NEGATIVE
Comment: NORMAL
Neisseria Gonorrhea: NEGATIVE
Trichomonas: NEGATIVE

## 2022-06-20 ENCOUNTER — Ambulatory Visit (INDEPENDENT_AMBULATORY_CARE_PROVIDER_SITE_OTHER): Payer: Medicaid Other

## 2022-06-20 VITALS — BP 102/67 | HR 73 | Ht 64.0 in | Wt 157.0 lb

## 2022-06-20 DIAGNOSIS — Z30013 Encounter for initial prescription of injectable contraceptive: Secondary | ICD-10-CM | POA: Diagnosis not present

## 2022-06-20 LAB — POCT URINE PREGNANCY: Preg Test, Ur: NEGATIVE

## 2022-06-20 MED ORDER — MEDROXYPROGESTERONE ACETATE 150 MG/ML IM SUSP
150.0000 mg | Freq: Once | INTRAMUSCULAR | Status: AC
  Administered 2022-06-20: 150 mg via INTRAMUSCULAR

## 2022-06-20 NOTE — Progress Notes (Signed)
SUBJECTIVE: Rachael Oliver is a 31 y.o. female who presents for DEPO Injection.  OBJECTIVE: Appears well, in no apparent distress.  Vital signs are normal. Urine dipstick shows not done.    ASSESSMENT: Need for BC UPT negative Last PAP 08/17/2021  PLAN: DEPO Injection given in RUOQ, tolerated well. Next DEPO due 10/30-11/13/2023

## 2022-06-24 ENCOUNTER — Ambulatory Visit: Payer: Medicaid Other

## 2022-06-28 ENCOUNTER — Ambulatory Visit (INDEPENDENT_AMBULATORY_CARE_PROVIDER_SITE_OTHER): Payer: Medicaid Other | Admitting: *Deleted

## 2022-06-28 ENCOUNTER — Other Ambulatory Visit (HOSPITAL_COMMUNITY)
Admission: RE | Admit: 2022-06-28 | Discharge: 2022-06-28 | Disposition: A | Discharge status: Home / Self Care | Payer: Medicaid Other | Source: Ambulatory Visit | Attending: Obstetrics and Gynecology | Admitting: Obstetrics and Gynecology

## 2022-06-28 VITALS — BP 119/84 | HR 89

## 2022-06-28 DIAGNOSIS — N898 Other specified noninflammatory disorders of vagina: Secondary | ICD-10-CM | POA: Diagnosis present

## 2022-06-28 NOTE — Progress Notes (Signed)
SUBJECTIVE:  31 y.o. female complains of white vaginal discharge for 1 week(s). Denies abnormal vaginal bleeding or significant pelvic pain or fever. No UTI symptoms. Denies history of known exposure to STD.  Patient's last menstrual period was 06/14/2022 (exact date).  OBJECTIVE:  She appears well, afebrile. Urine dipstick: not done.  ASSESSMENT:  Vaginal Discharge  Vaginal Odor   PLAN:  GC, chlamydia, trichomonas, BVAG, CVAG probe sent to lab. Treatment: To be determined once lab results are received ROV prn if symptoms persist or worsen.

## 2022-06-29 LAB — CERVICOVAGINAL ANCILLARY ONLY
Bacterial Vaginitis (gardnerella): POSITIVE — AB
Candida Glabrata: NEGATIVE
Candida Vaginitis: NEGATIVE
Chlamydia: NEGATIVE
Comment: NEGATIVE
Comment: NEGATIVE
Comment: NEGATIVE
Comment: NEGATIVE
Comment: NEGATIVE
Comment: NORMAL
Neisseria Gonorrhea: NEGATIVE
Trichomonas: NEGATIVE

## 2022-06-30 MED ORDER — METRONIDAZOLE 500 MG PO TABS: 500.0000 mg | ORAL_TABLET | Freq: Two times a day (BID) | ORAL | 0 refills | Status: DC

## 2022-06-30 NOTE — Addendum Note (Signed)
Addended by: Catalina Antigua on: 06/30/2022 10:11 AM   Modules accepted: Orders

## 2022-07-06 NOTE — Progress Notes (Signed)
Patient was assessed and managed by nursing staff during this encounter. I have reviewed the chart and agree with the documentation and plan. I have also made any necessary editorial changes.  Thania Woodlief, MD 07/06/2022 2:30 PM   

## 2022-07-28 ENCOUNTER — Other Ambulatory Visit (HOSPITAL_COMMUNITY)
Admission: RE | Admit: 2022-07-28 | Discharge: 2022-07-28 | Disposition: A | Discharge status: Home / Self Care | Payer: Medicaid Other | Source: Ambulatory Visit | Attending: Obstetrics and Gynecology | Admitting: Obstetrics and Gynecology

## 2022-07-28 ENCOUNTER — Ambulatory Visit (INDEPENDENT_AMBULATORY_CARE_PROVIDER_SITE_OTHER): Payer: Medicaid Other | Admitting: General Practice

## 2022-07-28 VITALS — BP 108/72 | HR 69 | Ht 64.0 in | Wt 157.5 lb

## 2022-07-28 DIAGNOSIS — R300 Dysuria: Secondary | ICD-10-CM

## 2022-07-28 LAB — POCT URINALYSIS DIPSTICK
Bilirubin, UA: NEGATIVE
Blood, UA: NEGATIVE
Glucose, UA: NEGATIVE
Ketones, UA: NEGATIVE
Leukocytes, UA: NEGATIVE
Nitrite, UA: NEGATIVE
Protein, UA: NEGATIVE
Spec Grav, UA: 1.015 (ref 1.010–1.025)
Urobilinogen, UA: 1 E.U./dL
pH, UA: 6.5 (ref 5.0–8.0)

## 2022-07-28 MED ORDER — NITROFURANTOIN MONOHYD MACRO 100 MG PO CAPS: 100.0000 mg | ORAL_CAPSULE | Freq: Two times a day (BID) | ORAL | 0 refills | Status: DC

## 2022-07-28 NOTE — Progress Notes (Signed)
SUBJECTIVE:  31 y.o. female complains of "bladder pain" for 3 day(s). Denies abnormal vaginal bleeding or significant pelvic pain or fever. No UTI symptoms. Denies history of known exposure to STD.  No LMP recorded. (Menstrual status: Irregular Periods).  OBJECTIVE:  She appears well, afebrile. Urine dipstick: negative for all components.  ASSESSMENT:  Vaginal Discharge  Vaginal Odor   PLAN:  GC, chlamydia, trichomonas, BVAG, CVAG probe sent to lab. Treatment: To be determined once lab results are received ROV prn if symptoms persist or worsen.

## 2022-07-29 LAB — CERVICOVAGINAL ANCILLARY ONLY
Bacterial Vaginitis (gardnerella): POSITIVE — AB
Candida Glabrata: NEGATIVE
Candida Vaginitis: POSITIVE — AB
Chlamydia: NEGATIVE
Comment: NEGATIVE
Comment: NEGATIVE
Comment: NEGATIVE
Comment: NEGATIVE
Comment: NEGATIVE
Comment: NORMAL
Neisseria Gonorrhea: NEGATIVE
Trichomonas: NEGATIVE

## 2022-07-29 MED ORDER — FLUCONAZOLE 150 MG PO TABS: 150.0000 mg | ORAL_TABLET | Freq: Once | ORAL | 0 refills | Status: AC

## 2022-07-29 MED ORDER — METRONIDAZOLE 500 MG PO TABS: 500.0000 mg | ORAL_TABLET | Freq: Two times a day (BID) | ORAL | 0 refills | Status: DC

## 2022-07-29 NOTE — Addendum Note (Signed)
Addended by: Mora Bellman on: 07/29/2022 06:04 PM   Modules accepted: Orders

## 2022-07-30 LAB — URINE CULTURE

## 2022-08-19 ENCOUNTER — Encounter: Payer: Self-pay | Admitting: Advanced Practice Midwife

## 2022-08-22 ENCOUNTER — Other Ambulatory Visit: Payer: Self-pay

## 2022-08-22 MED ORDER — FLUCONAZOLE 150 MG PO TABS: 150.0000 mg | ORAL_TABLET | Freq: Once | ORAL | 0 refills | Status: AC

## 2022-09-07 ENCOUNTER — Other Ambulatory Visit (HOSPITAL_COMMUNITY)
Admission: RE | Admit: 2022-09-07 | Discharge: 2022-09-07 | Disposition: A | Discharge status: Home / Self Care | Payer: Medicaid Other | Source: Ambulatory Visit | Attending: Obstetrics and Gynecology | Admitting: Obstetrics and Gynecology

## 2022-09-07 ENCOUNTER — Ambulatory Visit (INDEPENDENT_AMBULATORY_CARE_PROVIDER_SITE_OTHER): Payer: Medicaid Other | Admitting: General Practice

## 2022-09-07 VITALS — BP 112/76 | HR 67 | Ht 64.0 in | Wt 154.9 lb

## 2022-09-07 DIAGNOSIS — R3989 Other symptoms and signs involving the genitourinary system: Secondary | ICD-10-CM | POA: Insufficient documentation

## 2022-09-07 LAB — POCT URINALYSIS DIPSTICK
Bilirubin, UA: NEGATIVE
Blood, UA: NEGATIVE
Glucose, UA: NEGATIVE
Ketones, UA: NEGATIVE
Nitrite, UA: NEGATIVE
Protein, UA: POSITIVE — AB
Spec Grav, UA: 1.015 (ref 1.010–1.025)
Urobilinogen, UA: NEGATIVE E.U./dL — AB
pH, UA: 6 (ref 5.0–8.0)

## 2022-09-07 NOTE — Progress Notes (Signed)
SUBJECTIVE:  31 y.o. female complains of increase urinary urgency and decreased output for 2 day(s). Denies abnormal vaginal bleeding or significant pelvic pain or fever. No UTI symptoms. Denies history of known exposure to STD.  No LMP recorded. (Menstrual status: Irregular Periods).  OBJECTIVE:  She appears well, afebrile. Urine dipstick: positive for protein and positive for leukocytes.  ASSESSMENT:  Vaginal Discharge  Vaginal Odor   PLAN:  GC, chlamydia, trichomonas, BVAG, CVAG probe sent to lab. Treatment: To be determined once lab results are received ROV prn if symptoms persist or worsen.

## 2022-09-07 NOTE — Progress Notes (Signed)
Urine culture ordered by nurse visits RN Arlie Solomons.

## 2022-09-07 NOTE — Progress Notes (Signed)
Patient was assessed and managed by nursing staff during this encounter. I have reviewed the chart and agree with the documentation and plan. I have also made any necessary editorial changes.  Griffin Basil, MD 09/07/2022 10:28 AM

## 2022-09-08 LAB — CERVICOVAGINAL ANCILLARY ONLY
Bacterial Vaginitis (gardnerella): POSITIVE — AB
Candida Glabrata: NEGATIVE
Candida Vaginitis: NEGATIVE
Chlamydia: NEGATIVE
Comment: NEGATIVE
Comment: NEGATIVE
Comment: NEGATIVE
Comment: NEGATIVE
Comment: NEGATIVE
Comment: NORMAL
Neisseria Gonorrhea: NEGATIVE
Trichomonas: NEGATIVE

## 2022-09-09 ENCOUNTER — Other Ambulatory Visit: Payer: Self-pay

## 2022-09-09 DIAGNOSIS — B9689 Other specified bacterial agents as the cause of diseases classified elsewhere: Secondary | ICD-10-CM

## 2022-09-09 MED ORDER — METRONIDAZOLE 500 MG PO TABS: 500.0000 mg | ORAL_TABLET | Freq: Two times a day (BID) | ORAL | 0 refills | Status: DC

## 2022-09-10 LAB — URINE CULTURE

## 2022-09-13 ENCOUNTER — Other Ambulatory Visit: Payer: Self-pay | Admitting: *Deleted

## 2022-09-13 ENCOUNTER — Ambulatory Visit: Payer: Medicaid Other

## 2022-09-13 DIAGNOSIS — N3000 Acute cystitis without hematuria: Secondary | ICD-10-CM

## 2022-09-13 MED ORDER — CEPHALEXIN 500 MG PO CAPS: 500.0000 mg | ORAL_CAPSULE | Freq: Three times a day (TID) | ORAL | 0 refills | Status: DC

## 2022-09-13 NOTE — Progress Notes (Signed)
TC to pt. No answer. VM not set up. MyChart message sent with info on UTI and RX sent.

## 2022-09-14 ENCOUNTER — Other Ambulatory Visit: Payer: Self-pay | Admitting: Emergency Medicine

## 2022-09-14 DIAGNOSIS — Z3042 Encounter for surveillance of injectable contraceptive: Secondary | ICD-10-CM

## 2022-09-14 MED ORDER — MEDROXYPROGESTERONE ACETATE 150 MG/ML IM SUSP: 150.0000 mg | INTRAMUSCULAR | 3 refills | Status: DC

## 2022-09-14 NOTE — Progress Notes (Signed)
Depo RX

## 2022-09-15 ENCOUNTER — Ambulatory Visit (INDEPENDENT_AMBULATORY_CARE_PROVIDER_SITE_OTHER): Payer: Medicaid Other | Admitting: *Deleted

## 2022-09-15 VITALS — BP 119/76 | HR 99

## 2022-09-15 DIAGNOSIS — Z3042 Encounter for surveillance of injectable contraceptive: Secondary | ICD-10-CM

## 2022-09-15 MED ORDER — MEDROXYPROGESTERONE ACETATE 150 MG/ML IM SUSP
150.0000 mg | Freq: Once | INTRAMUSCULAR | Status: AC
  Administered 2022-09-15: 150 mg via INTRAMUSCULAR

## 2022-09-15 NOTE — Progress Notes (Signed)
Patient was assessed and managed by nursing staff during this encounter. I have reviewed the chart and agree with the documentation and plan. I have also made any necessary editorial changes.  Warden Fillers, MD 09/15/2022 11:46 AM

## 2022-09-15 NOTE — Progress Notes (Signed)
Date last pap: 08/17/21. Last Depo-Provera: 06/20/22. Side Effects if any: NA. Serum HCG indicated? NA. Depo-Provera 150 mg IM given by: Selena Batten. Rolley Sims, RNC in [place] glut. Next appointment due 12/01/22-12/15/22.  Confirmed pt received previous message regarding UTI and RX sent.

## 2022-09-19 ENCOUNTER — Encounter: Payer: Self-pay | Admitting: Emergency Medicine

## 2022-10-04 ENCOUNTER — Other Ambulatory Visit (HOSPITAL_COMMUNITY)
Admission: RE | Admit: 2022-10-04 | Discharge: 2022-10-04 | Disposition: A | Discharge status: Home / Self Care | Payer: Medicaid Other | Source: Ambulatory Visit | Attending: Obstetrics and Gynecology | Admitting: Obstetrics and Gynecology

## 2022-10-04 ENCOUNTER — Ambulatory Visit (INDEPENDENT_AMBULATORY_CARE_PROVIDER_SITE_OTHER): Payer: Medicaid Other | Admitting: *Deleted

## 2022-10-04 VITALS — BP 115/76 | HR 98 | Wt 161.0 lb

## 2022-10-04 DIAGNOSIS — Z113 Encounter for screening for infections with a predominantly sexual mode of transmission: Secondary | ICD-10-CM

## 2022-10-04 NOTE — Progress Notes (Signed)
SUBJECTIVE:  31 y.o. female who desires a STI screen. Denies abnormal vaginal discharge, bleeding or significant pelvic pain. No UTI symptoms. Denies history of known exposure to STD.  No LMP recorded. Patient has had an injection.  OBJECTIVE:  She appears well.   ASSESSMENT:  STI Screen   PLAN:  Pt offered STI blood screening-declined GC, chlamydia, and trichomonas probe sent to lab.  Treatment: To be determined once lab results are received.  Pt follow up as needed.

## 2022-10-05 LAB — CERVICOVAGINAL ANCILLARY ONLY
Bacterial Vaginitis (gardnerella): POSITIVE — AB
Candida Glabrata: NEGATIVE
Candida Vaginitis: NEGATIVE
Chlamydia: NEGATIVE
Comment: NEGATIVE
Comment: NEGATIVE
Comment: NEGATIVE
Comment: NEGATIVE
Comment: NEGATIVE
Comment: NORMAL
Neisseria Gonorrhea: NEGATIVE
Trichomonas: NEGATIVE

## 2022-10-06 ENCOUNTER — Encounter: Payer: Self-pay | Admitting: Emergency Medicine

## 2022-10-06 ENCOUNTER — Other Ambulatory Visit: Payer: Self-pay | Admitting: Emergency Medicine

## 2022-10-06 MED ORDER — METRONIDAZOLE 500 MG PO TABS: 500.0000 mg | ORAL_TABLET | Freq: Two times a day (BID) | ORAL | 0 refills | Status: DC

## 2022-10-06 NOTE — Progress Notes (Signed)
Rx for BV 

## 2022-11-10 ENCOUNTER — Ambulatory Visit (INDEPENDENT_AMBULATORY_CARE_PROVIDER_SITE_OTHER): Payer: Medicaid Other

## 2022-11-10 ENCOUNTER — Other Ambulatory Visit (HOSPITAL_COMMUNITY)
Admission: RE | Admit: 2022-11-10 | Discharge: 2022-11-10 | Disposition: A | Discharge status: Home / Self Care | Payer: Medicaid Other | Source: Ambulatory Visit | Attending: Obstetrics and Gynecology | Admitting: Obstetrics and Gynecology

## 2022-11-10 DIAGNOSIS — N898 Other specified noninflammatory disorders of vagina: Secondary | ICD-10-CM | POA: Diagnosis not present

## 2022-11-10 NOTE — Progress Notes (Signed)
..  SUBJECTIVE:  32 y.o. female reports new sexual partner and would like to be screened for STDs. Denies abnormal vaginal bleeding or significant pelvic pain or fever. No UTI symptoms. Denies history of known exposure to STD.  No LMP recorded. Patient has had an injection.  OBJECTIVE:  She appears well, afebrile. Urine dipstick: not done.  ASSESSMENT:  New partner     PLAN:  GC, chlamydia, trichomonas, BVAG, CVAG probe sent to lab. Treatment: To be determined once lab results are received ROV prn if symptoms persist or worsen.

## 2022-11-11 LAB — CERVICOVAGINAL ANCILLARY ONLY
Bacterial Vaginitis (gardnerella): NEGATIVE
Candida Glabrata: NEGATIVE
Candida Vaginitis: POSITIVE — AB
Chlamydia: NEGATIVE
Comment: NEGATIVE
Comment: NEGATIVE
Comment: NEGATIVE
Comment: NEGATIVE
Comment: NEGATIVE
Comment: NORMAL
Neisseria Gonorrhea: NEGATIVE
Trichomonas: NEGATIVE

## 2022-11-14 ENCOUNTER — Encounter: Payer: Self-pay | Admitting: Advanced Practice Midwife

## 2022-11-14 ENCOUNTER — Other Ambulatory Visit: Payer: Self-pay | Admitting: Emergency Medicine

## 2022-11-14 DIAGNOSIS — B3731 Acute candidiasis of vulva and vagina: Secondary | ICD-10-CM

## 2022-11-14 MED ORDER — FLUCONAZOLE 150 MG PO TABS: 150.0000 mg | ORAL_TABLET | Freq: Once | ORAL | 0 refills | Status: AC

## 2022-11-14 NOTE — Progress Notes (Signed)
Rx for yeast per protocol. 

## 2022-11-19 ENCOUNTER — Encounter: Payer: Self-pay | Admitting: Advanced Practice Midwife

## 2022-11-29 ENCOUNTER — Encounter: Payer: Self-pay | Admitting: Obstetrics

## 2022-11-29 ENCOUNTER — Ambulatory Visit (INDEPENDENT_AMBULATORY_CARE_PROVIDER_SITE_OTHER): Payer: Medicaid Other | Admitting: Obstetrics

## 2022-11-29 ENCOUNTER — Other Ambulatory Visit (HOSPITAL_COMMUNITY)
Admission: RE | Admit: 2022-11-29 | Discharge: 2022-11-29 | Disposition: A | Discharge status: Home / Self Care | Payer: Medicaid Other | Source: Ambulatory Visit | Attending: Obstetrics | Admitting: Obstetrics

## 2022-11-29 VITALS — BP 130/80 | HR 78 | Ht 64.0 in | Wt 176.0 lb

## 2022-11-29 DIAGNOSIS — Z01419 Encounter for gynecological examination (general) (routine) without abnormal findings: Secondary | ICD-10-CM

## 2022-11-29 DIAGNOSIS — N760 Acute vaginitis: Secondary | ICD-10-CM

## 2022-11-29 DIAGNOSIS — N898 Other specified noninflammatory disorders of vagina: Secondary | ICD-10-CM | POA: Diagnosis present

## 2022-11-29 DIAGNOSIS — Z113 Encounter for screening for infections with a predominantly sexual mode of transmission: Secondary | ICD-10-CM

## 2022-11-29 MED ORDER — TINIDAZOLE 500 MG PO TABS: 1000.0000 mg | ORAL_TABLET | Freq: Every day | ORAL | 2 refills | Status: DC

## 2022-11-29 NOTE — Progress Notes (Signed)
32 y.o GYN presents for AEX/PAP.  C/o vaginal discharge, odor.  Denies itching or burning.
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
## 2022-11-30 LAB — CERVICOVAGINAL ANCILLARY ONLY
Bacterial Vaginitis (gardnerella): POSITIVE — AB
Candida Glabrata: NEGATIVE
Candida Vaginitis: POSITIVE — AB
Chlamydia: NEGATIVE
Comment: NEGATIVE
Comment: NEGATIVE
Comment: NEGATIVE
Comment: NEGATIVE
Comment: NEGATIVE
Comment: NORMAL
Neisseria Gonorrhea: NEGATIVE
Trichomonas: NEGATIVE

## 2022-11-30 LAB — HEPATITIS B SURFACE ANTIGEN: Hepatitis B Surface Ag: NEGATIVE

## 2022-11-30 LAB — RPR: RPR Ser Ql: NONREACTIVE

## 2022-11-30 LAB — HEPATITIS C ANTIBODY: Hep C Virus Ab: NONREACTIVE

## 2022-11-30 LAB — HIV ANTIBODY (ROUTINE TESTING W REFLEX): HIV Screen 4th Generation wRfx: NONREACTIVE

## 2022-12-01 ENCOUNTER — Other Ambulatory Visit: Payer: Self-pay | Admitting: Obstetrics

## 2022-12-01 DIAGNOSIS — B379 Candidiasis, unspecified: Secondary | ICD-10-CM

## 2022-12-01 MED ORDER — METRONIDAZOLE 500 MG PO TABS: 500.0000 mg | ORAL_TABLET | Freq: Two times a day (BID) | ORAL | 2 refills | Status: DC

## 2022-12-01 MED ORDER — FLUCONAZOLE 150 MG PO TABS: 150.0000 mg | ORAL_TABLET | Freq: Once | ORAL | 0 refills | Status: AC

## 2022-12-01 NOTE — Addendum Note (Signed)
Addended by: Baltazar Najjar A on: 12/01/2022 02:10 PM   Modules accepted: Orders

## 2022-12-06 ENCOUNTER — Ambulatory Visit: Payer: Medicaid Other

## 2022-12-06 LAB — CYTOLOGY - PAP: Adequacy: ABSENT

## 2022-12-07 ENCOUNTER — Telehealth (INDEPENDENT_AMBULATORY_CARE_PROVIDER_SITE_OTHER): Payer: Medicaid Other | Admitting: Obstetrics

## 2022-12-07 ENCOUNTER — Telehealth: Payer: Self-pay

## 2022-12-07 ENCOUNTER — Encounter: Payer: Self-pay | Admitting: Obstetrics

## 2022-12-07 DIAGNOSIS — R87612 Low grade squamous intraepithelial lesion on cytologic smear of cervix (LGSIL): Secondary | ICD-10-CM

## 2022-12-07 DIAGNOSIS — Z9889 Other specified postprocedural states: Secondary | ICD-10-CM | POA: Diagnosis not present

## 2022-12-07 DIAGNOSIS — D069 Carcinoma in situ of cervix, unspecified: Secondary | ICD-10-CM | POA: Diagnosis not present

## 2022-12-07 DIAGNOSIS — R87622 Low grade squamous intraepithelial lesion on cytologic smear of vagina (LGSIL): Secondary | ICD-10-CM

## 2022-12-07 NOTE — Progress Notes (Unsigned)
GYNECOLOGY VIRTUAL VISIT ENCOUNTER NOTE  Provider location: [facility] at {Blank single:[id number]::"[facility]"}   Patient location: Home  I connected with Rachael Oliver on 12/07/22 at  2:30 PM EST by MyChart Video Encounter and verified that I am speaking with the correct person using two identifiers.   I discussed the limitations, risks, security and privacy concerns of performing an evaluation and management service virtually and the availability of in person appointments. I also discussed with the patient that there may be a patient responsible charge related to this service. The patient expressed understanding and agreed to proceed.   History:  Rachael Oliver is a 32 y.o. [phone] female being evaluated today for ***. She denies any abnormal vaginal discharge, bleeding, pelvic pain or other concerns.       Past Medical History:  Diagnosis Date   Asthma    as a child   History of methicillin resistant staphylococcus aureus (MRSA)    Infection    left elbow   Preterm labor    Syncope    Syncope    once monthly with and without preg. Started approx in 2016   Trichomonosis    Vaginal Pap smear, abnormal    Past Surgical History:  Procedure Laterality Date   ELBOW SURGERY Left    FOOT SURGERY     GYNECOLOGIC CRYOSURGERY     I & D EXTREMITY Left 12/03/2015   Procedure: IRRIGATION AND DEBRIDEMENT LEFT ELBOW;  Surgeon: Leandrew Koyanagi, MD;  Location: [facility];  Service: Orthopedics;  Laterality: Left;   uterine biopsy     The following portions of the patient's history were reviewed and updated as appropriate: allergies, current medications, past family history, past medical history, past social history, past surgical history and problem list.   Health Maintenance:  Normal pap and negative HRHPV on ***.  Normal mammogram on ***.   Review of Systems:   Pertinent items noted in HPI and remainder of comprehensive ROS otherwise negative.  Physical Exam:   General:  Alert, oriented and cooperative. Patient appears to be in no acute distress.  Mental Status: Normal mood and affect. Normal behavior. Normal judgment and thought content.   Respiratory: Normal respiratory effort, no problems with respiration noted  Rest of physical exam deferred due to type of encounter  Labs and Imaging Results for orders placed or performed in visit on 11/29/22 (from the past 336 hour(s))  Cytology - PAP( [facility])   Collection Time: 11/29/22  2:41 PM  Result Value Ref Range   Adequacy      Satisfactory for evaluation; transformation zone component ABSENT.   Diagnosis - Low grade squamous intraepithelial lesion (LSIL) (A)    Microorganisms      Fungal organisms present consistent with Candida spp.  Cervicovaginal ancillary only( [facility])   Collection Time: 11/29/22  2:41 PM  Result Value Ref Range   Neisseria Gonorrhea Negative    Chlamydia Negative    Trichomonas Negative    Bacterial Vaginitis (gardnerella) Positive (A)    Candida Vaginitis Positive (A)    Candida Glabrata Negative    Comment      Normal Reference Range Bacterial Vaginosis - Negative   Comment Normal Reference Range Candida Species - Negative    Comment Normal Reference Range Candida Galbrata - Negative    Comment Normal Reference Range Trichomonas - Negative    Comment Normal Reference [place] Chlamydia - Negative    Comment  Normal Reference Range Neisseria Gonorrhea - Negative  RPR   Collection Time: 11/29/22  3:03 PM  Result Value Ref Range   RPR Ser Ql Non Reactive Non Reactive  HIV antibody (with reflex)   Collection Time: 11/29/22  3:03 PM  Result Value Ref Range   HIV Screen 4th Generation wRfx Non Reactive Non Reactive  Hepatitis C Antibody   Collection Time: 11/29/22  3:03 PM  Result Value Ref Range   Hep C Virus Ab Non Reactive Non Reactive   Hepatitis B Surface AntiGEN   Collection Time: 11/29/22  3:03 PM  Result Value Ref Range   Hepatitis B Surface Ag Negative Negative   No results found.     Assessment and Plan:     1. LGSIL Pap smear of vagina ***  2. High grade squamous intraepithelial lesion (HGSIL), grade 3 CIN, on biopsy of cervix ***  3. Status post LEEP (loop electrosurgical excision procedure) of cervix ***       I discussed the assessment and treatment plan with the patient. The patient was provided an opportunity to ask questions and all were answered. The patient agreed with the plan and demonstrated an understanding of the instructions.   The patient was advised to call back or seek an in-person evaluation/go to the ED if the symptoms worsen or if the condition fails to improve as anticipated.  I provided *** minutes of face-to-face time during this encounter.   Baltazar Najjar, MD [facility] [place] 12/07/2022

## 2022-12-07 NOTE — Telephone Encounter (Signed)
S/w pt and advised of pap results and need for colpo. Pt stated that she would like to have a virtual consult with provider about results, advised scheduler to call.

## 2022-12-07 NOTE — Progress Notes (Unsigned)
S/w pt via telephone. Pt has concerns about abnormal PAP and need for colpo.

## 2022-12-09 ENCOUNTER — Ambulatory Visit: Payer: Medicaid Other

## 2022-12-14 ENCOUNTER — Ambulatory Visit (INDEPENDENT_AMBULATORY_CARE_PROVIDER_SITE_OTHER): Payer: Medicaid Other

## 2022-12-14 DIAGNOSIS — Z3042 Encounter for surveillance of injectable contraceptive: Secondary | ICD-10-CM

## 2022-12-14 MED ORDER — FLUCONAZOLE 150 MG PO TABS: 150.0000 mg | ORAL_TABLET | Freq: Once | ORAL | 0 refills | Status: AC

## 2022-12-14 NOTE — Progress Notes (Signed)
Pt is in the office for depo injection. Administered in RUOQ and pt tolerated well. Next due April 25- May 9.  .. Administrations This Visit     medroxyPROGESTERone (DEPO-PROVERA) injection 150 mg     Admin Date 12/14/2022 Action Given Dose 150 mg Route Intramuscular Administered By Tristan Schroeder D, RN           Pt also complaining of vaginal itching, still taking Metronidazole, sent rx for yeast per protocol.

## 2022-12-25 ENCOUNTER — Other Ambulatory Visit: Payer: Self-pay

## 2022-12-25 ENCOUNTER — Encounter (HOSPITAL_COMMUNITY): Payer: Self-pay | Admitting: *Deleted

## 2022-12-25 ENCOUNTER — Ambulatory Visit (HOSPITAL_COMMUNITY)
Admission: EM | Admit: 2022-12-25 | Discharge: 2022-12-25 | Disposition: A | Discharge status: Home / Self Care | Payer: Medicaid Other | Attending: Physician Assistant | Admitting: Physician Assistant

## 2022-12-25 ENCOUNTER — Ambulatory Visit (INDEPENDENT_AMBULATORY_CARE_PROVIDER_SITE_OTHER): Payer: Medicaid Other

## 2022-12-25 DIAGNOSIS — M25571 Pain in right ankle and joints of right foot: Secondary | ICD-10-CM | POA: Diagnosis not present

## 2022-12-25 MED ORDER — PREDNISONE 20 MG PO TABS: 40.0000 mg | ORAL_TABLET | Freq: Every day | ORAL | 0 refills | Status: AC

## 2022-12-25 NOTE — Discharge Instructions (Signed)
  Follow up with ortho if no improvement over the next week or two.
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
## 2022-12-25 NOTE — ED Triage Notes (Signed)
Pt reports falling off curb while running last night.Pt has RT ankle pain today.

## 2022-12-27 ENCOUNTER — Encounter: Payer: Medicaid Other | Admitting: Obstetrics and Gynecology

## 2023-01-17 ENCOUNTER — Ambulatory Visit: Payer: Medicaid Other | Admitting: Emergency Medicine

## 2023-01-17 ENCOUNTER — Other Ambulatory Visit (HOSPITAL_COMMUNITY)
Admission: RE | Admit: 2023-01-17 | Discharge: 2023-01-17 | Disposition: A | Discharge status: Home / Self Care | Payer: Medicaid Other | Source: Ambulatory Visit | Attending: Obstetrics and Gynecology | Admitting: Obstetrics and Gynecology

## 2023-01-17 VITALS — BP 116/82 | HR 78 | Ht 64.0 in | Wt 176.4 lb

## 2023-01-17 DIAGNOSIS — Z113 Encounter for screening for infections with a predominantly sexual mode of transmission: Secondary | ICD-10-CM | POA: Diagnosis present

## 2023-01-17 NOTE — Progress Notes (Signed)
SUBJECTIVE:  32 y.o. female present for routine STI check. Has new partner.   Denies abnormal vaginal discharge, bleeding or significant pelvic pain or fever. No UTI symptoms. Denies history of known exposure to STD.  No LMP recorded. Patient has had an injection.  OBJECTIVE:  She appears well, afebrile.   ASSESSMENT:  Vaginal Discharge  Vaginal Odor   PLAN:  GC, chlamydia, trichomonas, BVAG, CVAG probe sent to lab. Treatment: To be determined once lab results are received ROV prn if symptoms persist or worsen.

## 2023-01-20 LAB — CERVICOVAGINAL ANCILLARY ONLY
Bacterial Vaginitis (gardnerella): NEGATIVE
Chlamydia: NEGATIVE
Comment: NEGATIVE
Comment: NEGATIVE
Comment: NEGATIVE
Comment: NEGATIVE
Comment: NEGATIVE
Comment: NORMAL
Neisseria Gonorrhea: NEGATIVE

## 2023-02-08 ENCOUNTER — Ambulatory Visit: Payer: Medicaid Other | Admitting: Obstetrics and Gynecology

## 2023-02-08 ENCOUNTER — Other Ambulatory Visit (HOSPITAL_COMMUNITY)
Admission: RE | Admit: 2023-02-08 | Discharge: 2023-02-08 | Disposition: A | Discharge status: Home / Self Care | Payer: Medicaid Other | Source: Ambulatory Visit | Attending: Obstetrics and Gynecology | Admitting: Obstetrics and Gynecology

## 2023-02-08 VITALS — BP 113/81 | HR 81 | Wt 179.0 lb

## 2023-02-08 DIAGNOSIS — R87612 Low grade squamous intraepithelial lesion on cytologic smear of cervix (LGSIL): Secondary | ICD-10-CM

## 2023-02-08 NOTE — Progress Notes (Signed)
Pt is having some cramping and vaginal spotting, due for depo in a few weeks.  Pt request full panel self swab today.

## 2023-02-08 NOTE — Progress Notes (Signed)
[name] presents to discuss colposcopy . 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  PE  Not performed  A/P LGSIL  Abnormal pap smears reviewed with. Informed pt that current guidelines would recommend a colposcopy. Discussed risks if colposcopy not completed. Discussed if colposcopy not performed recommend repeat pap smear in 6 months. Following discussion, pt desires to have repeat pap smear in 6 months. Pt made aware that if repeat pap smear is the same or worsen, that colposcopy would be recommended again. Pt verbalized understanding of discussion. She will follow up in 6 months

## 2023-02-09 LAB — CERVICOVAGINAL ANCILLARY ONLY
Bacterial Vaginitis (gardnerella): POSITIVE — AB
Candida Glabrata: NEGATIVE
Candida Vaginitis: POSITIVE — AB
Chlamydia: NEGATIVE
Comment: NEGATIVE
Comment: NEGATIVE
Comment: NEGATIVE
Comment: NEGATIVE
Comment: NEGATIVE
Comment: NORMAL
Neisseria Gonorrhea: NEGATIVE
Trichomonas: NEGATIVE

## 2023-02-10 ENCOUNTER — Other Ambulatory Visit: Payer: Self-pay

## 2023-02-10 DIAGNOSIS — B9689 Other specified bacterial agents as the cause of diseases classified elsewhere: Secondary | ICD-10-CM

## 2023-02-10 DIAGNOSIS — B379 Candidiasis, unspecified: Secondary | ICD-10-CM

## 2023-02-10 MED ORDER — FLUCONAZOLE 150 MG PO TABS: 150.0000 mg | ORAL_TABLET | Freq: Once | ORAL | 0 refills | Status: AC

## 2023-02-10 MED ORDER — METRONIDAZOLE 500 MG PO TABS: 500.0000 mg | ORAL_TABLET | Freq: Two times a day (BID) | ORAL | 2 refills | Status: DC

## 2023-03-02 ENCOUNTER — Ambulatory Visit: Payer: Medicaid Other

## 2023-03-07 ENCOUNTER — Ambulatory Visit: Payer: Medicaid Other

## 2023-03-07 ENCOUNTER — Ambulatory Visit (INDEPENDENT_AMBULATORY_CARE_PROVIDER_SITE_OTHER): Payer: Medicaid Other

## 2023-03-07 ENCOUNTER — Other Ambulatory Visit (HOSPITAL_COMMUNITY)
Admission: RE | Admit: 2023-03-07 | Discharge: 2023-03-07 | Disposition: A | Discharge status: Home / Self Care | Payer: Medicaid Other | Source: Ambulatory Visit | Attending: Obstetrics and Gynecology | Admitting: Obstetrics and Gynecology

## 2023-03-07 VITALS — BP 114/79 | HR 90 | Wt 184.0 lb

## 2023-03-07 DIAGNOSIS — Z113 Encounter for screening for infections with a predominantly sexual mode of transmission: Secondary | ICD-10-CM | POA: Diagnosis present

## 2023-03-07 DIAGNOSIS — N898 Other specified noninflammatory disorders of vagina: Secondary | ICD-10-CM | POA: Diagnosis present

## 2023-03-07 DIAGNOSIS — Z3042 Encounter for surveillance of injectable contraceptive: Secondary | ICD-10-CM | POA: Diagnosis not present

## 2023-03-07 NOTE — Progress Notes (Signed)
Subjective: Pt in for Depo Provera injection. During visit, pt reports vaginal discharge and requests vaginal STD testing only.  Objective: Need for contraception and self-swab.  Assessment: Pt tolerated Depo injection. Depo given L upper outer quadrant.  Malodorous vaginal discharge   Plan:  Next injection due July 16-30.   Treatment: To be determined once the provider reviews the results.

## 2023-03-08 LAB — CERVICOVAGINAL ANCILLARY ONLY
Bacterial Vaginitis (gardnerella): NEGATIVE
Candida Glabrata: NEGATIVE
Candida Vaginitis: NEGATIVE
Chlamydia: NEGATIVE
Comment: NEGATIVE
Comment: NEGATIVE
Comment: NEGATIVE
Comment: NEGATIVE
Comment: NEGATIVE
Comment: NORMAL
Neisseria Gonorrhea: NEGATIVE
Trichomonas: NEGATIVE

## 2023-03-28 ENCOUNTER — Ambulatory Visit (INDEPENDENT_AMBULATORY_CARE_PROVIDER_SITE_OTHER): Payer: Medicaid Other | Admitting: General Practice

## 2023-03-28 ENCOUNTER — Other Ambulatory Visit (HOSPITAL_COMMUNITY)
Admission: RE | Admit: 2023-03-28 | Discharge: 2023-03-28 | Disposition: A | Discharge status: Home / Self Care | Payer: Medicaid Other | Source: Ambulatory Visit | Attending: Advanced Practice Midwife | Admitting: Advanced Practice Midwife

## 2023-03-28 VITALS — BP 114/75 | HR 70 | Ht 64.0 in | Wt 184.0 lb

## 2023-03-28 DIAGNOSIS — N898 Other specified noninflammatory disorders of vagina: Secondary | ICD-10-CM | POA: Diagnosis present

## 2023-03-28 NOTE — Progress Notes (Signed)
SUBJECTIVE:  32 y.o. female complains of white vaginal discharge for 2 day(s). Denies abnormal vaginal bleeding or significant pelvic pain or fever. No UTI symptoms. Denies history of known exposure to STD.  No LMP recorded. Patient has had an injection.  OBJECTIVE:  She appears well, afebrile. Urine dipstick: not done.  ASSESSMENT:  Vaginal Discharge  Vaginal Odor   PLAN:  GC, chlamydia, trichomonas, BVAG, CVAG probe sent to lab. Treatment: To be determined once lab results are received ROV prn if symptoms persist or worsen.

## 2023-03-28 NOTE — Progress Notes (Signed)
Pt preferred to self swab and was not seen by provider today.    I have reviewed the chart and agree with the nurse's note and plan of care for this visit.   Sharen Counter, CNM 2:17 PM

## 2023-03-29 ENCOUNTER — Other Ambulatory Visit: Payer: Self-pay

## 2023-03-29 DIAGNOSIS — B9689 Other specified bacterial agents as the cause of diseases classified elsewhere: Secondary | ICD-10-CM

## 2023-03-29 LAB — CERVICOVAGINAL ANCILLARY ONLY
Bacterial Vaginitis (gardnerella): POSITIVE — AB
Candida Glabrata: NEGATIVE
Candida Vaginitis: NEGATIVE
Chlamydia: NEGATIVE
Comment: NEGATIVE
Comment: NEGATIVE
Comment: NEGATIVE
Comment: NEGATIVE
Comment: NEGATIVE
Comment: NORMAL
Neisseria Gonorrhea: NEGATIVE
Trichomonas: NEGATIVE

## 2023-03-29 MED ORDER — METRONIDAZOLE 500 MG PO TABS: 500.0000 mg | ORAL_TABLET | Freq: Two times a day (BID) | ORAL | 2 refills | Status: DC

## 2023-04-08 ENCOUNTER — Encounter (HOSPITAL_COMMUNITY): Payer: Self-pay

## 2023-04-08 ENCOUNTER — Emergency Department (HOSPITAL_COMMUNITY)
Admission: EM | Admit: 2023-04-08 | Discharge: 2023-04-08 | Disposition: A | Discharge status: Home / Self Care | Payer: Medicaid Other | Attending: Emergency Medicine | Admitting: Emergency Medicine

## 2023-04-08 ENCOUNTER — Other Ambulatory Visit: Payer: Self-pay

## 2023-04-08 DIAGNOSIS — S8991XA Unspecified injury of right lower leg, initial encounter: Secondary | ICD-10-CM | POA: Diagnosis present

## 2023-04-08 DIAGNOSIS — Y9355 Activity, bike riding: Secondary | ICD-10-CM | POA: Insufficient documentation

## 2023-04-08 DIAGNOSIS — X17XXXA Contact with hot engines, machinery and tools, initial encounter: Secondary | ICD-10-CM | POA: Diagnosis not present

## 2023-04-08 DIAGNOSIS — T24291A Burn of second degree of multiple sites of right lower limb, except ankle and foot, initial encounter: Secondary | ICD-10-CM | POA: Diagnosis not present

## 2023-04-08 MED ORDER — MORPHINE SULFATE 15 MG PO TABS: 7.5000 mg | ORAL_TABLET | Freq: Four times a day (QID) | ORAL | 0 refills | Status: AC | PRN

## 2023-04-08 MED ORDER — BACITRACIN ZINC 500 UNIT/GM EX OINT: TOPICAL_OINTMENT | Freq: Two times a day (BID) | CUTANEOUS | Status: DC
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
## 2023-05-10 ENCOUNTER — Ambulatory Visit (INDEPENDENT_AMBULATORY_CARE_PROVIDER_SITE_OTHER): Payer: Medicaid Other | Admitting: *Deleted

## 2023-05-10 ENCOUNTER — Other Ambulatory Visit (HOSPITAL_COMMUNITY)
Admission: RE | Admit: 2023-05-10 | Discharge: 2023-05-10 | Disposition: A | Discharge status: Home / Self Care | Payer: Medicaid Other | Source: Ambulatory Visit | Attending: Student | Admitting: Student

## 2023-05-10 ENCOUNTER — Telehealth: Payer: Self-pay

## 2023-05-10 ENCOUNTER — Other Ambulatory Visit: Payer: Self-pay

## 2023-05-10 VITALS — BP 118/81 | HR 65

## 2023-05-10 DIAGNOSIS — Z113 Encounter for screening for infections with a predominantly sexual mode of transmission: Secondary | ICD-10-CM

## 2023-05-10 DIAGNOSIS — B379 Candidiasis, unspecified: Secondary | ICD-10-CM

## 2023-05-10 MED ORDER — FLUCONAZOLE 150 MG PO TABS: 150.0000 mg | ORAL_TABLET | ORAL | 3 refills | Status: DC

## 2023-05-10 NOTE — Progress Notes (Signed)
SUBJECTIVE:  ?32 y.o. female who desires a STI screen. ?Denies abnormal vaginal discharge, bleeding or significant pelvic pain. No UTI symptoms. Denies history of known exposure to STD. ? ?No LMP recorded. Patient has had an injection. ? ?OBJECTIVE:  ?She appears well. ? ? ?ASSESSMENT:  ?STI Screen  ? ?PLAN:  ?Pt offered STI blood screening-declined ?GC, chlamydia, and trichomonas probe sent to lab.  ?Treatment: To be determined once lab results are received. ? ?Pt follow up as needed.  ?

## 2023-05-10 NOTE — Telephone Encounter (Signed)
Patient called stating she needed yeast medications.  Diflucan sent.

## 2023-05-15 ENCOUNTER — Other Ambulatory Visit: Payer: Self-pay | Admitting: Emergency Medicine

## 2023-05-15 LAB — CERVICOVAGINAL ANCILLARY ONLY
Bacterial Vaginitis (gardnerella): POSITIVE — AB
Candida Glabrata: NEGATIVE
Candida Vaginitis: POSITIVE — AB
Chlamydia: NEGATIVE
Comment: NEGATIVE
Comment: NEGATIVE
Comment: NEGATIVE
Comment: NEGATIVE
Comment: NEGATIVE
Comment: NORMAL
Neisseria Gonorrhea: NEGATIVE
Trichomonas: POSITIVE — AB

## 2023-05-15 MED ORDER — METRONIDAZOLE 500 MG PO TABS: 500.0000 mg | ORAL_TABLET | Freq: Two times a day (BID) | ORAL | 0 refills | Status: DC

## 2023-05-15 MED ORDER — FLUCONAZOLE 150 MG PO TABS: 150.0000 mg | ORAL_TABLET | Freq: Once | ORAL | 0 refills | Status: AC

## 2023-05-15 NOTE — Progress Notes (Signed)
Rx for Trich, BV, yeast

## 2023-05-17 ENCOUNTER — Encounter: Payer: Self-pay | Admitting: Advanced Practice Midwife

## 2023-05-19 ENCOUNTER — Ambulatory Visit: Payer: Medicaid Other

## 2023-05-30 ENCOUNTER — Ambulatory Visit: Payer: Medicaid Other

## 2023-06-08 ENCOUNTER — Ambulatory Visit (HOSPITAL_COMMUNITY)
Admission: EM | Admit: 2023-06-08 | Discharge: 2023-06-08 | Disposition: A | Discharge status: Home / Self Care | Payer: Medicaid Other | Attending: Family Medicine | Admitting: Family Medicine

## 2023-06-08 ENCOUNTER — Encounter (HOSPITAL_COMMUNITY): Payer: Self-pay | Admitting: *Deleted

## 2023-06-08 DIAGNOSIS — Z113 Encounter for screening for infections with a predominantly sexual mode of transmission: Secondary | ICD-10-CM | POA: Insufficient documentation

## 2023-06-08 NOTE — ED Provider Notes (Signed)
[facility]   [id number] 06/08/23 Arrival Time: 1319  ASSESSMENT & PLAN:  1. Screening for STDs (sexually transmitted diseases)    Vaginal cytology pending. Without s/s of PID. No empiric treatment.  Labs Reviewed  CERVICOVAGINAL ANCILLARY ONLY   Will notify of any positive results. Instructed to refrain from sexual activity for at least seven days.  Reviewed expectations re: course of current medical issues. Questions answered. Outlined signs and symptoms indicating need for more acute intervention. Patient verbalized understanding. After Visit Summary given.   SUBJECTIVE:  Rachael Oliver is a 32 y.o. female who requests STD screening; slight vaginal d/c. Otherwise well. Is sexually active.  No LMP recorded. Patient has had an injection.   OBJECTIVE:  Vitals:   06/08/23 1523  BP: 106/77  Pulse: 83  Resp: 18  Temp: 98.2 F (36.8 C)  TempSrc: Oral  SpO2: 99%     General appearance: alert, cooperative, appears stated age and no distress GU: deferred Skin: warm and dry Psychological: alert and cooperative; normal mood and affect.    Labs Reviewed  CERVICOVAGINAL ANCILLARY ONLY    No Known Allergies  Past Medical History:  Diagnosis Date   Asthma    as a child   History of methicillin resistant staphylococcus aureus (MRSA)    Infection    left elbow   Preterm labor    Syncope    Syncope    once monthly with and without preg. Started approx in 2016   Trichomonosis    Vaginal Pap smear, abnormal    Family History  Problem Relation Age of Onset   Diabetes Mother    Alcohol abuse Neg Hx    Arthritis Neg Hx    Asthma Neg Hx    Birth defects Neg Hx    COPD Neg Hx    Cancer Neg Hx    Depression Neg Hx    Drug abuse Neg Hx    Early death Neg Hx    Hearing loss Neg Hx    Heart disease Neg Hx    Hyperlipidemia Neg Hx    Hypertension Neg Hx    Kidney disease Neg Hx    Learning disabilities Neg Hx    Mental illness Neg Hx    Mental  retardation Neg Hx    Miscarriages / Stillbirths Neg Hx    Stroke Neg Hx    Vision loss Neg Hx    Social History   Socioeconomic History   Marital status: Single    Spouse name: Not on file   Number of children: 4   Years of education: Not on file   Highest education level: Not on file  Occupational History   Not on file  Tobacco Use   Smoking status: Never   Smokeless tobacco: Never  Vaping Use   Vaping status: Never Used  Substance and Sexual Activity   Alcohol use: No    Alcohol/week: 0.0 standard drinks of alcohol   Drug use: No   Sexual activity: Yes    Partners: Male    Birth control/protection: None  Other Topics Concern   Not on file  Social History Narrative   Not on file   Social Determinants of Health   Financial Resource Strain: Not on file  Food Insecurity: Not on file  Transportation Needs: Not on file  Physical Activity: Not on file  Stress: Not on file  Social Connections: Not on file  Intimate Partner Violence: Not on file  Mardella Layman, MD 06/08/23 [phone]

## 2023-06-08 NOTE — ED Triage Notes (Signed)
Pt states that she has been having some vaginal discharge X 2-3 days. She would like STI testing.

## 2023-06-09 ENCOUNTER — Telehealth: Payer: Self-pay

## 2023-06-09 MED ORDER — METRONIDAZOLE 500 MG PO TABS: 500.0000 mg | ORAL_TABLET | Freq: Two times a day (BID) | ORAL | 0 refills | Status: AC

## 2023-06-09 MED ORDER — FLUCONAZOLE 150 MG PO TABS: 150.0000 mg | ORAL_TABLET | Freq: Once | ORAL | 0 refills | Status: AC

## 2023-06-09 NOTE — Telephone Encounter (Signed)
Per protocol, pt requires tx with metronidazole and diflucan. Attempted to reach patient x1. Unable to LVM. Rx sent to pharmacy on file.

## 2023-06-15 ENCOUNTER — Ambulatory Visit: Payer: Medicaid Other

## 2023-07-11 ENCOUNTER — Other Ambulatory Visit (HOSPITAL_COMMUNITY)
Admission: RE | Admit: 2023-07-11 | Discharge: 2023-07-11 | Disposition: A | Discharge status: Home / Self Care | Payer: Medicaid Other | Source: Ambulatory Visit | Attending: Obstetrics and Gynecology | Admitting: Obstetrics and Gynecology

## 2023-07-11 ENCOUNTER — Ambulatory Visit (INDEPENDENT_AMBULATORY_CARE_PROVIDER_SITE_OTHER): Payer: Medicaid Other | Admitting: Emergency Medicine

## 2023-07-11 VITALS — BP 119/78 | HR 78 | Ht 64.0 in | Wt 182.2 lb

## 2023-07-11 DIAGNOSIS — Z3042 Encounter for surveillance of injectable contraceptive: Secondary | ICD-10-CM

## 2023-07-11 DIAGNOSIS — R350 Frequency of micturition: Secondary | ICD-10-CM

## 2023-07-11 DIAGNOSIS — Z113 Encounter for screening for infections with a predominantly sexual mode of transmission: Secondary | ICD-10-CM | POA: Diagnosis present

## 2023-07-11 DIAGNOSIS — Z3202 Encounter for pregnancy test, result negative: Secondary | ICD-10-CM

## 2023-07-11 LAB — POCT URINALYSIS DIPSTICK
Bilirubin, UA: NEGATIVE
Blood, UA: NEGATIVE
Glucose, UA: NEGATIVE
Ketones, UA: NEGATIVE
Nitrite, UA: NEGATIVE
Protein, UA: NEGATIVE
Spec Grav, UA: 1.015 (ref 1.010–1.025)
Urobilinogen, UA: 0.2 U/dL
pH, UA: 6.5 (ref 5.0–8.0)

## 2023-07-11 LAB — POCT URINE PREGNANCY: Preg Test, Ur: NEGATIVE

## 2023-07-11 MED ORDER — MEDROXYPROGESTERONE ACETATE 150 MG/ML IM SUSP: 150.0000 mg | INTRAMUSCULAR | 0 refills | Status: DC

## 2023-07-11 NOTE — Progress Notes (Signed)
SUBJECTIVE:  32 y.o. female complains of urinary frequency for 7 day(s). Denies abnormal vaginal bleeding or significant pelvic pain or fever.  Denies history of known exposure to STD. Also reports urinary urgency.  No LMP recorded. Patient has had an injection.  OBJECTIVE:  She appears well, afebrile. Urine dipstick: positive for leukocytes.  ASSESSMENT:  Vaginal Discharge  Vaginal Odor   PLAN:  GC, chlamydia, trichomonas, BVAG, CVAG probe sent to lab. Urine culture sent to lab. Patient desires depo restart, UPT negative in office today. Treatment: To be determined once lab results are received ROV prn if symptoms persist or worsen.

## 2023-07-13 LAB — CERVICOVAGINAL ANCILLARY ONLY
Bacterial Vaginitis (gardnerella): POSITIVE — AB
Candida Glabrata: NEGATIVE
Candida Vaginitis: NEGATIVE
Chlamydia: NEGATIVE
Comment: NEGATIVE
Comment: NEGATIVE
Comment: NEGATIVE
Comment: NEGATIVE
Comment: NEGATIVE
Comment: NORMAL
Neisseria Gonorrhea: NEGATIVE
Trichomonas: NEGATIVE

## 2023-07-14 LAB — URINE CULTURE

## 2023-07-14 MED ORDER — AMOXICILLIN 500 MG PO CAPS: 500.0000 mg | ORAL_CAPSULE | Freq: Three times a day (TID) | ORAL | 0 refills | Status: AC

## 2023-07-14 NOTE — Addendum Note (Signed)
Addended by: Harvie Bridge on: 07/14/2023 07:32 PM   Modules accepted: Orders

## 2023-07-25 ENCOUNTER — Other Ambulatory Visit (HOSPITAL_COMMUNITY)
Admission: RE | Admit: 2023-07-25 | Discharge: 2023-07-25 | Disposition: A | Discharge status: Home / Self Care | Payer: Medicaid Other | Source: Ambulatory Visit | Attending: Obstetrics and Gynecology | Admitting: Obstetrics and Gynecology

## 2023-07-25 ENCOUNTER — Ambulatory Visit: Payer: Medicaid Other

## 2023-07-25 VITALS — BP 109/74 | HR 73 | Ht 64.0 in | Wt 182.0 lb

## 2023-07-25 DIAGNOSIS — N898 Other specified noninflammatory disorders of vagina: Secondary | ICD-10-CM

## 2023-07-25 DIAGNOSIS — Z113 Encounter for screening for infections with a predominantly sexual mode of transmission: Secondary | ICD-10-CM | POA: Diagnosis present

## 2023-07-25 DIAGNOSIS — N760 Acute vaginitis: Secondary | ICD-10-CM | POA: Insufficient documentation

## 2023-07-25 DIAGNOSIS — Z3202 Encounter for pregnancy test, result negative: Secondary | ICD-10-CM

## 2023-07-25 DIAGNOSIS — Z3042 Encounter for surveillance of injectable contraceptive: Secondary | ICD-10-CM

## 2023-07-25 LAB — POCT URINE PREGNANCY: Preg Test, Ur: NEGATIVE

## 2023-07-25 NOTE — Progress Notes (Signed)
Patient was assessed and managed by nursing staff during this encounter. I have reviewed the chart and agree with the documentation and plan. I have also made any necessary editorial changes.  Warden Fillers, MD 07/25/2023 2:36 PM

## 2023-07-25 NOTE — Progress Notes (Signed)
Date last pap: 11/29/2022 Last Depo-Provera: 03/07/2023 - Patient restarting Depo today. Side Effects if any: N/A Urine UPT indicated? Yes, UPT negative. Depo-Provera 150 mg IM given by: L. Sahasra Belue Next appointment due: Dec. 3rd - Dec. 17th.

## 2023-07-25 NOTE — Progress Notes (Signed)
SUBJECTIVE:  32 y.o. female complains of white vaginal discharge for 3 day(s) and vaginal itching. Denies abnormal vaginal bleeding or significant pelvic pain or fever. No UTI symptoms. Denies history of known exposure to STD.  No LMP recorded. Patient has had an injection.  OBJECTIVE:  She appears well, afebrile. Urine dipstick: not done.  ASSESSMENT:  Vaginal Discharge  Vaginitis Screening for STI's   PLAN:  GC, chlamydia, trichomonas, BVAG, CVAG probe sent to lab. Treatment: To be determined once lab results are received ROV prn if symptoms persist or worsen.

## 2023-07-27 ENCOUNTER — Other Ambulatory Visit: Payer: Self-pay

## 2023-07-27 DIAGNOSIS — N760 Acute vaginitis: Secondary | ICD-10-CM

## 2023-07-27 DIAGNOSIS — B379 Candidiasis, unspecified: Secondary | ICD-10-CM

## 2023-07-27 MED ORDER — METRONIDAZOLE 500 MG PO TABS: 500.0000 mg | ORAL_TABLET | Freq: Two times a day (BID) | ORAL | 0 refills | Status: DC

## 2023-07-27 MED ORDER — FLUCONAZOLE 150 MG PO TABS: 150.0000 mg | ORAL_TABLET | Freq: Once | ORAL | 0 refills | Status: AC

## 2023-08-01 DIAGNOSIS — Z3042 Encounter for surveillance of injectable contraceptive: Secondary | ICD-10-CM

## 2023-08-01 MED ORDER — MEDROXYPROGESTERONE ACETATE 150 MG/ML IM SUSY
150.0000 mg | PREFILLED_SYRINGE | Freq: Once | INTRAMUSCULAR | Status: AC
  Administered 2023-08-01: 150 mg via INTRAMUSCULAR

## 2023-08-01 NOTE — Addendum Note (Signed)
Addended by: Leola Brazil on: 08/01/2023 09:11 AM   Modules accepted: Orders

## 2023-09-13 ENCOUNTER — Other Ambulatory Visit (HOSPITAL_COMMUNITY)
Admission: RE | Admit: 2023-09-13 | Discharge: 2023-09-13 | Disposition: A | Discharge status: Home / Self Care | Payer: Medicaid Other | Source: Ambulatory Visit | Attending: Obstetrics and Gynecology | Admitting: Obstetrics and Gynecology

## 2023-09-13 ENCOUNTER — Ambulatory Visit: Payer: Medicaid Other

## 2023-09-13 DIAGNOSIS — N898 Other specified noninflammatory disorders of vagina: Secondary | ICD-10-CM | POA: Insufficient documentation

## 2023-09-13 DIAGNOSIS — Z3200 Encounter for pregnancy test, result unknown: Secondary | ICD-10-CM

## 2023-09-13 DIAGNOSIS — Z113 Encounter for screening for infections with a predominantly sexual mode of transmission: Secondary | ICD-10-CM

## 2023-09-13 DIAGNOSIS — Z3202 Encounter for pregnancy test, result negative: Secondary | ICD-10-CM | POA: Diagnosis not present

## 2023-09-13 DIAGNOSIS — R350 Frequency of micturition: Secondary | ICD-10-CM

## 2023-09-13 LAB — POCT URINE PREGNANCY: Preg Test, Ur: NEGATIVE

## 2023-09-13 NOTE — Progress Notes (Signed)
SUBJECTIVE:  32 y.o. female complains of creamy vaginal discharge for 3 day(s). Denies abnormal vaginal bleeding or significant pelvic pain or fever. Reports urinary frequency. Denies history of known exposure to STD.  No LMP recorded. Patient has had an injection.  OBJECTIVE:  She appears well, afebrile. Urine dipstick: not done.  ASSESSMENT:  Vaginal Discharge  Vaginal Odor   PLAN:  GC, chlamydia, trichomonas, BVAG, CVAG probe sent to lab. Treatment: To be determined once lab results are received ROV prn if symptoms persist or worsen.

## 2023-09-14 ENCOUNTER — Other Ambulatory Visit: Payer: Self-pay

## 2023-09-14 DIAGNOSIS — N760 Acute vaginitis: Secondary | ICD-10-CM

## 2023-09-14 LAB — CERVICOVAGINAL ANCILLARY ONLY
Bacterial Vaginitis (gardnerella): POSITIVE — AB
Candida Glabrata: NEGATIVE
Candida Vaginitis: NEGATIVE
Chlamydia: NEGATIVE
Comment: NEGATIVE
Comment: NEGATIVE
Comment: NEGATIVE
Comment: NEGATIVE
Comment: NEGATIVE
Comment: NORMAL
Neisseria Gonorrhea: NEGATIVE
Trichomonas: NEGATIVE

## 2023-09-14 MED ORDER — METRONIDAZOLE 500 MG PO TABS: 500.0000 mg | ORAL_TABLET | Freq: Two times a day (BID) | ORAL | 0 refills | Status: DC

## 2023-09-16 LAB — URINE CULTURE

## 2023-10-10 ENCOUNTER — Ambulatory Visit: Payer: Medicaid Other

## 2023-11-23 ENCOUNTER — Ambulatory Visit (INDEPENDENT_AMBULATORY_CARE_PROVIDER_SITE_OTHER): Payer: Medicaid Other | Admitting: General Practice

## 2023-11-23 ENCOUNTER — Other Ambulatory Visit (HOSPITAL_COMMUNITY)
Admission: RE | Admit: 2023-11-23 | Discharge: 2023-11-23 | Disposition: A | Discharge status: Home / Self Care | Payer: Medicaid Other | Source: Ambulatory Visit | Attending: Obstetrics and Gynecology | Admitting: Obstetrics and Gynecology

## 2023-11-23 ENCOUNTER — Encounter: Payer: Self-pay | Admitting: Obstetrics and Gynecology

## 2023-11-23 DIAGNOSIS — Z3202 Encounter for pregnancy test, result negative: Secondary | ICD-10-CM | POA: Diagnosis not present

## 2023-11-23 DIAGNOSIS — N898 Other specified noninflammatory disorders of vagina: Secondary | ICD-10-CM | POA: Diagnosis present

## 2023-11-23 LAB — POCT URINE PREGNANCY: Preg Test, Ur: NEGATIVE

## 2023-11-23 NOTE — Progress Notes (Signed)
SUBJECTIVE:  33 y.o. female presents for STD screening  Denies abnormal vaginal bleeding or significant pelvic pain or fever. No UTI symptoms. Denies history of known exposure to STD.  No LMP recorded. Patient has had an injection.  OBJECTIVE:  She appears well, afebrile. Urine dipstick: not done.  ASSESSMENT:  Vaginal Discharge  Vaginal Odor   PLAN:  GC, chlamydia, trichomonas, BVAG, CVAG probe sent to lab. Treatment: To be determined once lab results are received ROV prn if symptoms persist or worsen.

## 2023-11-24 LAB — CERVICOVAGINAL ANCILLARY ONLY
Bacterial Vaginitis (gardnerella): POSITIVE — AB
Candida Glabrata: NEGATIVE
Candida Vaginitis: NEGATIVE
Chlamydia: NEGATIVE
Comment: NEGATIVE
Comment: NEGATIVE
Comment: NEGATIVE
Comment: NEGATIVE
Comment: NEGATIVE
Comment: NORMAL
Neisseria Gonorrhea: NEGATIVE
Trichomonas: NEGATIVE

## 2023-11-24 MED ORDER — METRONIDAZOLE 500 MG PO TABS: 500.0000 mg | ORAL_TABLET | Freq: Two times a day (BID) | ORAL | 0 refills | Status: AC

## 2023-11-24 NOTE — Addendum Note (Signed)
Addended by: Sue Lush on: 11/24/2023 05:34 PM   Modules accepted: Orders

## 2024-01-09 ENCOUNTER — Ambulatory Visit
Admission: EM | Admit: 2024-01-09 | Discharge: 2024-01-09 | Disposition: A | Discharge status: Home / Self Care | Attending: Family Medicine | Admitting: Family Medicine

## 2024-01-09 ENCOUNTER — Other Ambulatory Visit: Payer: Self-pay

## 2024-01-09 DIAGNOSIS — Z113 Encounter for screening for infections with a predominantly sexual mode of transmission: Secondary | ICD-10-CM | POA: Diagnosis present

## 2024-01-09 DIAGNOSIS — Z202 Contact with and (suspected) exposure to infections with a predominantly sexual mode of transmission: Secondary | ICD-10-CM | POA: Insufficient documentation

## 2024-01-09 MED ORDER — DOXYCYCLINE HYCLATE 100 MG PO CAPS: 100.0000 mg | ORAL_CAPSULE | Freq: Two times a day (BID) | ORAL | 0 refills | Status: AC

## 2024-01-09 NOTE — Discharge Instructions (Signed)
 You were treated for chlamydia today it is important that you complete the course of antibiotics, no sexual intercourse for 1 week.  Other test results will be released to your MyChart account, if anything is positive and requires treatment we will contact you
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
## 2024-01-09 NOTE — ED Triage Notes (Signed)
 Pt presents to urgent care for STD testing. Pt currently denies symptoms. Pt states her sex partner tested positive for Chlamydia recently.

## 2024-01-10 ENCOUNTER — Ambulatory Visit

## 2024-01-10 LAB — CERVICOVAGINAL ANCILLARY ONLY
Bacterial Vaginitis (gardnerella): POSITIVE — AB
Candida Glabrata: NEGATIVE
Candida Vaginitis: NEGATIVE
Chlamydia: POSITIVE — AB
Comment: NEGATIVE
Comment: NEGATIVE
Comment: NEGATIVE
Comment: NEGATIVE
Comment: NEGATIVE
Comment: NORMAL
Neisseria Gonorrhea: NEGATIVE
Trichomonas: NEGATIVE

## 2024-01-11 ENCOUNTER — Ambulatory Visit

## 2024-01-11 ENCOUNTER — Other Ambulatory Visit (HOSPITAL_COMMUNITY)
Admission: RE | Admit: 2024-01-11 | Discharge: 2024-01-11 | Disposition: A | Discharge status: Home / Self Care | Source: Ambulatory Visit | Attending: Obstetrics and Gynecology | Admitting: Obstetrics and Gynecology

## 2024-01-11 VITALS — BP 121/85 | HR 78

## 2024-01-11 DIAGNOSIS — Z3042 Encounter for surveillance of injectable contraceptive: Secondary | ICD-10-CM | POA: Diagnosis not present

## 2024-01-11 DIAGNOSIS — Z3202 Encounter for pregnancy test, result negative: Secondary | ICD-10-CM | POA: Diagnosis not present

## 2024-01-11 DIAGNOSIS — Z113 Encounter for screening for infections with a predominantly sexual mode of transmission: Secondary | ICD-10-CM | POA: Diagnosis not present

## 2024-01-11 DIAGNOSIS — N898 Other specified noninflammatory disorders of vagina: Secondary | ICD-10-CM

## 2024-01-11 LAB — POCT URINE PREGNANCY: Preg Test, Ur: NEGATIVE

## 2024-01-11 MED ORDER — MEDROXYPROGESTERONE ACETATE 150 MG/ML IM SUSP
150.0000 mg | Freq: Once | INTRAMUSCULAR | Status: AC
  Administered 2024-01-11: 150 mg via INTRAMUSCULAR

## 2024-01-11 MED ORDER — MEDROXYPROGESTERONE ACETATE 150 MG/ML IM SUSP: 150.0000 mg | INTRAMUSCULAR | 0 refills | Status: AC

## 2024-01-11 MED ORDER — METRONIDAZOLE 0.75 % VA GEL: 1.0000 | Freq: Every day | VAGINAL | 1 refills | Status: DC

## 2024-01-11 NOTE — Progress Notes (Signed)
 Date last pap: 11/29/22. Last Depo-Provera: 07/25/23. Pt here today for depo restart. Per protocol if pt last had unprotected intercourse over 2 weeks ago obtain UPT and if negative can give depo. Pt last had intercourse a little over 2 weeks ago, negative UPT today in office.  Side Effects if any: NA. Serum HCG indicated? NA. Depo-Provera 150 mg IM given by: Karma Ganja, RN. Next appointment due May 22nd - June 5th 2025.  SUBJECTIVE:  33 y.o. female who desires a STI screen. Denies abnormal vaginal discharge, bleeding or significant pelvic pain. No UTI symptoms. Denies history of known exposure to STD.  No LMP recorded. Patient has had an injection.  OBJECTIVE:  She appears well.   ASSESSMENT:  STI Screen   PLAN:  Pt offered STI blood screening-declined GC, chlamydia, and trichomonas probe sent to lab.  Treatment: To be determined once lab results are received. Pt reported she tested positive for BV and Chlamydia at UC. Has RX for STD, requests medication for BV. RX sent. Pt still wants to self swab today.  Pt follow up as needed.

## 2024-01-12 ENCOUNTER — Other Ambulatory Visit: Payer: Self-pay | Admitting: Obstetrics and Gynecology

## 2024-01-12 ENCOUNTER — Encounter: Payer: Self-pay | Admitting: Obstetrics and Gynecology

## 2024-01-12 LAB — CERVICOVAGINAL ANCILLARY ONLY
Bacterial Vaginitis (gardnerella): POSITIVE — AB
Candida Glabrata: NEGATIVE
Candida Vaginitis: NEGATIVE
Chlamydia: POSITIVE — AB
Comment: NEGATIVE
Comment: NEGATIVE
Comment: NEGATIVE
Comment: NEGATIVE
Comment: NEGATIVE
Comment: NORMAL
Neisseria Gonorrhea: NEGATIVE
Trichomonas: NEGATIVE

## 2024-01-15 NOTE — Progress Notes (Signed)
[facility] form faxed.  

## 2024-01-15 NOTE — Progress Notes (Signed)
 TC to confirm that pt is aware of +chlamydia and has read provider's message. Pt aware of results and has read instructions. All questions answered. Pt verbalized understanding.

## 2024-02-06 ENCOUNTER — Ambulatory Visit

## 2024-02-06 ENCOUNTER — Other Ambulatory Visit (HOSPITAL_COMMUNITY)
Admission: RE | Admit: 2024-02-06 | Discharge: 2024-02-06 | Disposition: A | Discharge status: Home / Self Care | Source: Ambulatory Visit | Attending: Obstetrics and Gynecology | Admitting: Obstetrics and Gynecology

## 2024-02-06 VITALS — BP 130/84 | HR 98

## 2024-02-06 DIAGNOSIS — B9689 Other specified bacterial agents as the cause of diseases classified elsewhere: Secondary | ICD-10-CM | POA: Insufficient documentation

## 2024-02-06 DIAGNOSIS — N760 Acute vaginitis: Secondary | ICD-10-CM

## 2024-02-06 DIAGNOSIS — R350 Frequency of micturition: Secondary | ICD-10-CM

## 2024-02-06 LAB — POCT URINALYSIS DIPSTICK
Bilirubin, UA: NEGATIVE
Glucose, UA: NEGATIVE
Protein, UA: NEGATIVE
Spec Grav, UA: 1.02 (ref 1.010–1.025)
Urobilinogen, UA: 0.2 U/dL
pH, UA: 6 (ref 5.0–8.0)

## 2024-02-06 MED ORDER — NITROFURANTOIN MONOHYD MACRO 100 MG PO CAPS: 100.0000 mg | ORAL_CAPSULE | Freq: Two times a day (BID) | ORAL | 0 refills | Status: AC

## 2024-02-06 NOTE — Progress Notes (Signed)
 SUBJECTIVE:  33 y.o. female complains of previous BV early March, took round of medication, seeing if resolved.  Denies abnormal vaginal bleeding or significant pelvic pain or fever. Denies history of known exposure to STD.  No LMP recorded. Patient has had an injection.  OBJECTIVE:  She appears alert, well appearing, in no apparent distress Urine dipstick: positive for leukocytes, nitrates.  ASSESSMENT:  Vaginal Discharge  Dysuria    PLAN:  GC, chlamydia, trichomonas, BVAG, CVAG probe and urine culture sent to lab. Treatment: To be determined once lab results are received ROV prn if symptoms persist or worsen.

## 2024-02-07 LAB — CERVICOVAGINAL ANCILLARY ONLY
Bacterial Vaginitis (gardnerella): NEGATIVE
Candida Glabrata: NEGATIVE
Candida Vaginitis: NEGATIVE
Chlamydia: NEGATIVE
Comment: NEGATIVE
Comment: NEGATIVE
Comment: NEGATIVE
Comment: NEGATIVE
Comment: NEGATIVE
Comment: NORMAL
Neisseria Gonorrhea: NEGATIVE
Trichomonas: NEGATIVE

## 2024-03-06 ENCOUNTER — Other Ambulatory Visit (HOSPITAL_COMMUNITY)
Admission: RE | Admit: 2024-03-06 | Discharge: 2024-03-06 | Disposition: A | Discharge status: Home / Self Care | Source: Ambulatory Visit | Attending: Obstetrics and Gynecology | Admitting: Obstetrics and Gynecology

## 2024-03-06 ENCOUNTER — Ambulatory Visit

## 2024-03-06 VITALS — BP 105/79 | HR 80 | Wt 200.0 lb

## 2024-03-06 DIAGNOSIS — Z113 Encounter for screening for infections with a predominantly sexual mode of transmission: Secondary | ICD-10-CM | POA: Insufficient documentation

## 2024-03-06 NOTE — Progress Notes (Signed)
 SUBJECTIVE:  33 y.o. female who desires a STI screen. Denies abnormal vaginal discharge, bleeding or significant pelvic pain. No UTI symptoms. Denies history of known exposure to STD.  No LMP recorded. Patient has had an injection.  OBJECTIVE:  She appears well.   ASSESSMENT:  STI Screen   PLAN:  Pt offered STI blood screening-not indicated GC, chlamydia, and trichomonas probe sent to lab.  Treatment: To be determined once lab results are received.  Pt follow up as needed.

## 2024-03-07 LAB — CERVICOVAGINAL ANCILLARY ONLY
Bacterial Vaginitis (gardnerella): NEGATIVE
Candida Glabrata: NEGATIVE
Candida Vaginitis: POSITIVE — AB
Chlamydia: NEGATIVE
Comment: NEGATIVE
Comment: NEGATIVE
Comment: NEGATIVE
Comment: NEGATIVE
Comment: NEGATIVE
Comment: NORMAL
Neisseria Gonorrhea: NEGATIVE
Trichomonas: NEGATIVE

## 2024-03-11 ENCOUNTER — Other Ambulatory Visit: Payer: Self-pay

## 2024-03-11 DIAGNOSIS — B379 Candidiasis, unspecified: Secondary | ICD-10-CM

## 2024-03-11 MED ORDER — FLUCONAZOLE 150 MG PO TABS
150.0000 mg | ORAL_TABLET | Freq: Once | ORAL | 0 refills | Status: AC
Start: 2024-03-11 — End: 2024-03-11

## 2024-03-11 NOTE — Progress Notes (Signed)
 Diflucan rx sent per protocol

## 2024-04-03 ENCOUNTER — Ambulatory Visit

## 2024-04-17 ENCOUNTER — Ambulatory Visit

## 2024-04-17 ENCOUNTER — Other Ambulatory Visit (HOSPITAL_COMMUNITY)
Admission: RE | Admit: 2024-04-17 | Discharge: 2024-04-17 | Disposition: A | Discharge status: Home / Self Care | Source: Ambulatory Visit | Attending: Obstetrics and Gynecology | Admitting: Obstetrics and Gynecology

## 2024-04-17 VITALS — BP 129/86 | HR 82 | Ht 64.0 in | Wt 190.0 lb

## 2024-04-17 DIAGNOSIS — Z113 Encounter for screening for infections with a predominantly sexual mode of transmission: Secondary | ICD-10-CM

## 2024-04-17 NOTE — Progress Notes (Signed)
 SUBJECTIVE:  33 y.o. female who desires a STI screen. Denies abnormal vaginal discharge, bleeding or significant pelvic pain. No UTI symptoms. Denies history of known exposure to STD.  No LMP recorded. Patient has had an injection.  OBJECTIVE:  She appears well.   ASSESSMENT:  STI Screen   PLAN:  Pt offered STI blood screening-declined GC, chlamydia, and trichomonas probe sent to lab.  Treatment: To be determined once lab results are received.  Pt follow up as needed.

## 2024-04-18 LAB — CERVICOVAGINAL ANCILLARY ONLY
Bacterial Vaginitis (gardnerella): POSITIVE — AB
Candida Glabrata: NEGATIVE
Candida Vaginitis: NEGATIVE
Chlamydia: NEGATIVE
Comment: NEGATIVE
Comment: NEGATIVE
Comment: NEGATIVE
Comment: NEGATIVE
Comment: NEGATIVE
Comment: NORMAL
Neisseria Gonorrhea: NEGATIVE
Trichomonas: NEGATIVE

## 2024-04-23 ENCOUNTER — Telehealth: Payer: Self-pay

## 2024-04-23 MED ORDER — METRONIDAZOLE 0.75 % VA GEL: 1.0000 | Freq: Every day | VAGINAL | 1 refills | Status: AC

## 2024-04-23 NOTE — Telephone Encounter (Signed)
 error

## 2024-04-23 NOTE — Telephone Encounter (Signed)
 Pt lab positive for BV. Pt phoned requesting treatment. Metrogel  sent to pharmacy. This is the second BV diagnosis since 01/11/24. Pt advised to make appt with provider per protocol of reoccurrence within 6 months.

## 2024-05-07 ENCOUNTER — Other Ambulatory Visit: Payer: Self-pay | Admitting: *Deleted

## 2024-05-07 DIAGNOSIS — B379 Candidiasis, unspecified: Secondary | ICD-10-CM

## 2024-05-07 MED ORDER — FLUCONAZOLE 150 MG PO TABS: 150.0000 mg | ORAL_TABLET | Freq: Once | ORAL | 0 refills | Status: AC

## 2024-05-07 NOTE — Progress Notes (Signed)
 TC from pt reporting symptoms of vaginal yeast infection. Diflucan  RX per protocol. Strongly encouraged provider appt for annual exam and to discuss recurrent vaginal infections. This is the 5th vaginal infection in 6 months.

## 2024-05-16 ENCOUNTER — Encounter: Payer: Self-pay | Admitting: Obstetrics

## 2024-05-16 ENCOUNTER — Ambulatory Visit (INDEPENDENT_AMBULATORY_CARE_PROVIDER_SITE_OTHER): Admitting: Obstetrics

## 2024-05-16 ENCOUNTER — Other Ambulatory Visit (HOSPITAL_COMMUNITY)
Admission: RE | Admit: 2024-05-16 | Discharge: 2024-05-16 | Disposition: A | Discharge status: Home / Self Care | Source: Ambulatory Visit | Attending: Obstetrics | Admitting: Obstetrics

## 2024-05-16 VITALS — BP 121/81 | HR 67 | Ht 64.0 in | Wt 188.4 lb

## 2024-05-16 DIAGNOSIS — Z30013 Encounter for initial prescription of injectable contraceptive: Secondary | ICD-10-CM | POA: Diagnosis not present

## 2024-05-16 DIAGNOSIS — Z131 Encounter for screening for diabetes mellitus: Secondary | ICD-10-CM

## 2024-05-16 DIAGNOSIS — E569 Vitamin deficiency, unspecified: Secondary | ICD-10-CM

## 2024-05-16 DIAGNOSIS — B3731 Acute candidiasis of vulva and vagina: Secondary | ICD-10-CM

## 2024-05-16 DIAGNOSIS — N760 Acute vaginitis: Secondary | ICD-10-CM

## 2024-05-16 DIAGNOSIS — N898 Other specified noninflammatory disorders of vagina: Secondary | ICD-10-CM | POA: Diagnosis present

## 2024-05-16 DIAGNOSIS — Z113 Encounter for screening for infections with a predominantly sexual mode of transmission: Secondary | ICD-10-CM | POA: Diagnosis not present

## 2024-05-16 DIAGNOSIS — Z1331 Encounter for screening for depression: Secondary | ICD-10-CM | POA: Diagnosis not present

## 2024-05-16 DIAGNOSIS — Z01419 Encounter for gynecological examination (general) (routine) without abnormal findings: Secondary | ICD-10-CM | POA: Diagnosis present

## 2024-05-16 DIAGNOSIS — Z3202 Encounter for pregnancy test, result negative: Secondary | ICD-10-CM

## 2024-05-16 DIAGNOSIS — B9689 Other specified bacterial agents as the cause of diseases classified elsewhere: Secondary | ICD-10-CM

## 2024-05-16 DIAGNOSIS — Z3009 Encounter for other general counseling and advice on contraception: Secondary | ICD-10-CM

## 2024-05-16 LAB — POCT URINE PREGNANCY: Preg Test, Ur: NEGATIVE

## 2024-05-16 MED ORDER — VITAFOL ULTRA 29-0.6-0.4-200 MG PO CAPS: 1.0000 | ORAL_CAPSULE | Freq: Every day | ORAL | 3 refills | Status: AC

## 2024-05-16 MED ORDER — METRONIDAZOLE 0.75 % VA GEL: 1.0000 | Freq: Two times a day (BID) | VAGINAL | 0 refills | Status: AC

## 2024-05-16 MED ORDER — MEDROXYPROGESTERONE ACETATE 150 MG/ML IM SUSP: 150.0000 mg | INTRAMUSCULAR | 3 refills | Status: AC

## 2024-05-16 MED ORDER — FLUCONAZOLE 200 MG PO TABS: 200.0000 mg | ORAL_TABLET | ORAL | 2 refills | Status: AC

## 2024-05-16 MED ORDER — MEDROXYPROGESTERONE ACETATE 150 MG/ML IM SUSP: 150.0000 mg | Freq: Once | INTRAMUSCULAR | Status: AC

## 2024-05-16 NOTE — Progress Notes (Signed)
 Subjective:        Rachael Oliver is a 33 y.o. female here for a routine exam.  Current complaints: Vaginal discharge and vaginal irritation like a yeast infection.  Personal health questionnaire:  Is patient Ashkenazi Jewish, have a family history of breast and/or ovarian cancer: no Is there a family history of uterine cancer diagnosed at age < 17, gastrointestinal cancer, urinary tract cancer, family member who is a Personnel officer syndrome-associated carrier: no Is the patient overweight and hypertensive, family history of diabetes, personal history of gestational diabetes, preeclampsia or PCOS: yes Is patient over 21, have PCOS,  family history of premature CHD under age 67, diabetes, smoke, have hypertension or peripheral artery disease:  no At any time, has a partner hit, kicked or otherwise hurt or frightened you?: no Over the past 2 weeks, have you felt down, depressed or hopeless?: no Over the past 2 weeks, have you felt little interest or pleasure in doing things?:no   Gynecologic History No LMP recorded (lmp unknown). Patient has had an injection. Contraception: none Last Pap: 2024. Results were: LGSIL Last mammogram: n/a. Results were: n/a  Obstetric History OB History  Gravida Para Term Preterm AB Living  5 4 2 2 1 4   SAB IAB Ectopic Multiple Live Births  1   0 4    # Outcome Date GA Lbr Len/2nd Weight Sex Type Anes PTL Lv  5 Preterm 02/07/19 [redacted]w[redacted]d  5 lb 7.1 oz (2.469 kg) M Vag-Spont EPI  LIV     Birth Comments: N/A  4 SAB 03/29/18 [redacted]w[redacted]d         3 Term 03/15/14 [redacted]w[redacted]d 02:20 / 00:15 7 lb 12.2 oz (3.52 kg) M Vag-Spont EPI  LIV  2 Preterm 2014 [redacted]w[redacted]d    Vag-Spont   LIV  1 Term 2011 [redacted]w[redacted]d    Vag-Spont   LIV    Past Medical History:  Diagnosis Date   Asthma    as a child   History of methicillin resistant staphylococcus aureus (MRSA)    Infection    left elbow   Preterm labor    Syncope    Syncope    once monthly with and without preg. Started approx in 2016    Trichomonosis    Vaginal Pap smear, abnormal     Past Surgical History:  Procedure Laterality Date   ELBOW SURGERY Left    FOOT SURGERY     GYNECOLOGIC CRYOSURGERY     I & D EXTREMITY Left 12/03/2015   Procedure: IRRIGATION AND DEBRIDEMENT LEFT ELBOW;  Surgeon: Kay CHRISTELLA Cummins, MD;  Location: [facility] OR;  Service: Orthopedics;  Laterality: Left;   uterine biopsy       Current Outpatient Medications:    fluconazole  (DIFLUCAN ) 200 MG tablet, Take 1 tablet (200 mg total) by mouth every 3 (three) days., Disp: 3 tablet, Rfl: 2   medroxyPROGESTERone  (DEPO-PROVERA ) 150 MG/ML injection, Inject 1 mL (150 mg total) into the muscle every 3 (three) months., Disp: 1 mL, Rfl: 3   metroNIDAZOLE  (METROGEL ) 0.75 % vaginal gel, Place 1 Applicatorful vaginally 2 (two) times daily., Disp: 70 g, Rfl: 0   Prenat-Fe Poly-Methfol-FA-DHA (VITAFOL  ULTRA) 29-0.6-0.4-200 MG CAPS, Take 1 capsule by mouth daily before breakfast., Disp: 34 each, Rfl: 3   diclofenac  (VOLTAREN ) 75 MG EC tablet, Take 1 tablet (75 mg total) by mouth 2 (two) times daily. (Patient not taking: Reported on 05/16/2024), Disp: 14 tablet, Rfl: 0   medroxyPROGESTERone  (DEPO-PROVERA ) 150 MG/ML injection, Inject 1  mL (150 mg total) into the muscle every 3 (three) months. Schedule annual visit before anymore refills. (Patient not taking: Reported on 05/16/2024), Disp: 1 mL, Rfl: 0   metroNIDAZOLE  (METROGEL ) 0.75 % vaginal gel, Place 1 Applicatorful vaginally at bedtime. Apply one applicatorful to vagina at bedtime for 5 days (Patient not taking: Reported on 05/16/2024), Disp: 70 g, Rfl: 1   metroNIDAZOLE  (METROGEL ) 0.75 % vaginal gel, Place 1 Applicatorful vaginally at bedtime. Apply one applicatorful to vagina at bedtime for 5 days (Patient not taking: Reported on 05/16/2024), Disp: 70 g, Rfl: 1   nitrofurantoin , macrocrystal-monohydrate, (MACROBID ) 100 MG capsule, Take 1 capsule (100 mg total) by mouth 2 (two) times daily. (Patient not taking: Reported on 05/16/2024),  Disp: 14 capsule, Rfl: 0  Current Facility-Administered Medications:    medroxyPROGESTERone  (DEPO-PROVERA ) injection 150 mg, 150 mg, Intramuscular, Q90 days, Rudy Carlin LABOR, MD, 150 mg at 03/07/23 1540   medroxyPROGESTERone  (DEPO-PROVERA ) injection 150 mg, 150 mg, Intramuscular, Once,  No Known Allergies  Social History   Tobacco Use   Smoking status: Never   Smokeless tobacco: Never  Substance Use Topics   Alcohol use: No    Alcohol/week: 0.0 standard drinks of alcohol    Family History  Problem Relation Age of Onset   Diabetes Mother    Alcohol abuse Neg Hx    Arthritis Neg Hx    Asthma Neg Hx    Birth defects Neg Hx    COPD Neg Hx    Cancer Neg Hx    Depression Neg Hx    Drug abuse Neg Hx    Early death Neg Hx    Hearing loss Neg Hx    Heart disease Neg Hx    Hyperlipidemia Neg Hx    Hypertension Neg Hx    Kidney disease Neg Hx    Learning disabilities Neg Hx    Mental illness Neg Hx    Mental retardation Neg Hx    Miscarriages / Stillbirths Neg Hx    Stroke Neg Hx    Vision loss Neg Hx       Review of Systems  Constitutional: negative for fatigue and weight loss Respiratory: negative for cough and wheezing Cardiovascular: negative for chest pain, fatigue and palpitations Gastrointestinal: negative for abdominal pain and change in bowel habits Musculoskeletal:negative for myalgias Neurological: negative for gait problems and tremors Behavioral/Psych: negative for abusive relationship, depression Endocrine: negative for temperature intolerance    Genitourinary: positive for vaginal discharge and irritation..  negative for abnormal menstrual periods, genital lesions, hot flashes, sexual problems  Integument/breast: negative for breast lump, breast tenderness, nipple discharge and skin lesion(s)    Objective:       BP 121/81   Pulse 67   Ht 5' 4 (1.626 m)   Wt 188 lb 6.4 oz (85.5 kg)   LMP  (LMP Unknown)   BMI 32.34 kg/m  General:   Alert and no  distress  Skin:   no rash or abnormalities  Lungs:   clear to auscultation bilaterally  Heart:   regular rate and rhythm, S1, S2 normal, no murmur, click, rub or gallop  Breasts:   normal without suspicious masses, skin or nipple changes or axillary nodes  Abdomen:  normal findings: no organomegaly, soft, non-tender and no hernia  Pelvis:  External genitalia: normal general appearance Urinary system: urethral meatus normal and bladder without fullness, nontender Vaginal: normal without tenderness, induration or masses Cervix: normal appearance Adnexa: normal bimanual exam Uterus: anteverted and non-tender, normal  size   Lab Review Urine pregnancy test Labs reviewed yes Radiologic studies reviewed no  I have spent a total of 20 minutes of face-to-face time, excluding clinical staff time, reviewing notes and preparing to see patient, ordering tests and/or medications, and counseling the patient.   Assessment:   1. Encounter for gynecological examination with Papanicolaou smear of cervix (Primary) Rx: - Cytology - PAP( [place])  2. Vaginal discharge Rx: - Cervicovaginal ancillary only( )  3. BV (bacterial vaginosis) Rx: - metroNIDAZOLE  (METROGEL ) 0.75 % vaginal gel; Place 1 Applicatorful vaginally 2 (two) times daily.  Dispense: 70 g; Refill: 0  4. Candida vaginitis Rx: - fluconazole  (DIFLUCAN ) 200 MG tablet; Take 1 tablet (200 mg total) by mouth every 3 (three) days.  Dispense: 3 tablet; Refill: 2  5. Screen for STD (sexually transmitted disease) Rx: - HIV Antibody (routine testing w rflx) - Hepatitis B surface antigen - RPR - Hepatitis C antibody  6. Encounter for counseling regarding contraception - options discussed.  Wants to restart Depo Injections.  7. Encounter for initial prescription of injectable contraceptive Rx: - POCT urine pregnancy - medroxyPROGESTERone  (DEPO-PROVERA ) injection 150 mg - medroxyPROGESTERone  (DEPO-PROVERA ) 150 MG/ML  injection; Inject 1 mL (150 mg total) into the muscle every 3 (three) months.  Dispense: 1 mL; Refill: 3  8. Screening for diabetes mellitus Rx: - Hemoglobin A1c  9. Vitamin deficiency Rx: - Prenat-Fe Poly-Methfol-FA-DHA (VITAFOL  ULTRA) 29-0.6-0.4-200 MG CAPS; Take 1 capsule by mouth daily before breakfast.  Dispense: 34 each; Refill: 3      Plan:    Education reviewed: calcium  supplements, depression evaluation, low fat, low cholesterol diet, safe sex/STD prevention, self breast exams, and weight bearing exercise. Contraception: Depo-Provera  injections. Follow up in: 1 year.   Meds ordered this encounter  Medications   medroxyPROGESTERone  (DEPO-PROVERA ) injection 150 mg   medroxyPROGESTERone  (DEPO-PROVERA ) 150 MG/ML injection    Sig: Inject 1 mL (150 mg total) into the muscle every 3 (three) months.    Dispense:  1 mL    Refill:  3   fluconazole  (DIFLUCAN ) 200 MG tablet    Sig: Take 1 tablet (200 mg total) by mouth every 3 (three) days.    Dispense:  3 tablet    Refill:  2   metroNIDAZOLE  (METROGEL ) 0.75 % vaginal gel    Sig: Place 1 Applicatorful vaginally 2 (two) times daily.    Dispense:  70 g    Refill:  0   Prenat-Fe Poly-Methfol-FA-DHA (VITAFOL  ULTRA) 29-0.6-0.4-200 MG CAPS    Sig: Take 1 capsule by mouth daily before breakfast.    Dispense:  34 each    Refill:  3   Orders Placed This Encounter  Procedures   Hemoglobin A1c   HIV Antibody (routine testing w rflx)   Hepatitis B surface antigen   RPR   Hepatitis C antibody   POCT urine pregnancy    CARLIN RONAL CENTERS, MD, FACOG Attending Obstetrician & Gynecologist, [facility] for [facility], [facility], [place] 05/16/2024

## 2024-05-16 NOTE — Progress Notes (Signed)
 poct

## 2024-05-17 ENCOUNTER — Ambulatory Visit: Payer: Self-pay | Admitting: Family Medicine

## 2024-05-17 LAB — CERVICOVAGINAL ANCILLARY ONLY
Bacterial Vaginitis (gardnerella): POSITIVE — AB
Candida Glabrata: NEGATIVE
Candida Vaginitis: POSITIVE — AB
Chlamydia: NEGATIVE
Comment: NEGATIVE
Comment: NEGATIVE
Comment: NEGATIVE
Comment: NEGATIVE
Comment: NEGATIVE
Comment: NORMAL
Neisseria Gonorrhea: NEGATIVE
Trichomonas: NEGATIVE

## 2024-05-17 LAB — HEMOGLOBIN A1C
Est. average glucose Bld gHb Est-mCnc: 103 mg/dL
Hgb A1c MFr Bld: 5.2 % (ref 4.8–5.6)

## 2024-05-17 LAB — HIV ANTIBODY (ROUTINE TESTING W REFLEX): HIV Screen 4th Generation wRfx: NONREACTIVE

## 2024-05-17 LAB — RPR: RPR Ser Ql: NONREACTIVE

## 2024-05-17 LAB — HEPATITIS B SURFACE ANTIGEN: Hepatitis B Surface Ag: NEGATIVE

## 2024-05-17 LAB — HEPATITIS C ANTIBODY: Hep C Virus Ab: NONREACTIVE

## 2024-05-22 LAB — CYTOLOGY - PAP
Adequacy: ABSENT
Comment: NEGATIVE
Diagnosis: NEGATIVE
Diagnosis: REACTIVE
High risk HPV: NEGATIVE

## 2024-05-23 ENCOUNTER — Other Ambulatory Visit: Payer: Self-pay | Admitting: Obstetrics

## 2024-07-16 ENCOUNTER — Ambulatory Visit

## 2024-07-23 ENCOUNTER — Ambulatory Visit

## 2024-07-29 ENCOUNTER — Encounter: Payer: Self-pay | Admitting: Obstetrics

## 2024-07-30 ENCOUNTER — Other Ambulatory Visit: Payer: Self-pay | Admitting: Obstetrics

## 2024-07-30 ENCOUNTER — Ambulatory Visit

## 2024-07-30 ENCOUNTER — Other Ambulatory Visit (HOSPITAL_COMMUNITY)
Admission: RE | Admit: 2024-07-30 | Discharge: 2024-07-30 | Disposition: A | Discharge status: Home / Self Care | Source: Ambulatory Visit | Attending: Obstetrics and Gynecology | Admitting: Obstetrics and Gynecology

## 2024-07-30 VITALS — BP 123/82 | HR 89 | Ht 64.0 in | Wt 183.0 lb

## 2024-07-30 DIAGNOSIS — N898 Other specified noninflammatory disorders of vagina: Secondary | ICD-10-CM | POA: Diagnosis present

## 2024-07-30 DIAGNOSIS — B9689 Other specified bacterial agents as the cause of diseases classified elsewhere: Secondary | ICD-10-CM

## 2024-07-30 NOTE — Progress Notes (Signed)
 SUBJECTIVE:  33 y.o. female complains of [place] and thick vaginal discharge with odor for 1 week(s). Denies abnormal vaginal bleeding or significant pelvic pain or fever. No UTI symptoms. Denies history of known exposure to STD.  No LMP recorded. Patient has had an injection.  OBJECTIVE:  She appears well, afebrile. Urine dipstick: not done.  ASSESSMENT:  Vaginal Discharge  Vaginal Odor   PLAN:  GC, chlamydia, trichomonas, BVAG, CVAG probe sent to lab. Treatment: To be determined once lab results are received ROV prn if symptoms persist or worsen.

## 2024-08-02 ENCOUNTER — Ambulatory Visit

## 2024-08-05 ENCOUNTER — Other Ambulatory Visit: Payer: Self-pay

## 2024-08-05 ENCOUNTER — Ambulatory Visit

## 2024-08-05 DIAGNOSIS — B9689 Other specified bacterial agents as the cause of diseases classified elsewhere: Secondary | ICD-10-CM

## 2024-08-05 LAB — CERVICOVAGINAL ANCILLARY ONLY
Bacterial Vaginitis (gardnerella): POSITIVE — AB
Candida Glabrata: NEGATIVE
Candida Vaginitis: NEGATIVE
Chlamydia: NEGATIVE
Comment: NEGATIVE
Comment: NEGATIVE
Comment: NEGATIVE
Comment: NEGATIVE
Comment: NEGATIVE
Comment: NORMAL
Neisseria Gonorrhea: NEGATIVE
Trichomonas: NEGATIVE

## 2024-08-05 MED ORDER — METRONIDAZOLE 0.75 % VA GEL: 1.0000 | Freq: Every day | VAGINAL | 1 refills | Status: AC

## 2024-08-05 NOTE — Progress Notes (Signed)
 Rx sent for BV per patient request.

## 2024-08-06 ENCOUNTER — Ambulatory Visit (INDEPENDENT_AMBULATORY_CARE_PROVIDER_SITE_OTHER)

## 2024-08-06 DIAGNOSIS — Z3042 Encounter for surveillance of injectable contraceptive: Secondary | ICD-10-CM

## 2024-08-06 MED ORDER — MEDROXYPROGESTERONE ACETATE 150 MG/ML IM SUSP
150.0000 mg | Freq: Once | INTRAMUSCULAR | Status: AC
  Administered 2024-08-06: 150 mg via INTRAMUSCULAR

## 2024-08-06 NOTE — Progress Notes (Signed)
 Date last pap: 05/16/24. Last Depo-Provera : 05/16/24. Side Effects if any: NA. Serum HCG indicated? NA. Depo-Provera  150 mg IM given by: Duwaine Galla, RN in [facility].  Next appointment due Dec 16-30 2025.

## 2024-09-19 ENCOUNTER — Other Ambulatory Visit (HOSPITAL_COMMUNITY)
Admission: RE | Admit: 2024-09-19 | Discharge: 2024-09-19 | Disposition: A | Discharge status: Home / Self Care | Source: Ambulatory Visit | Attending: Obstetrics and Gynecology | Admitting: Obstetrics and Gynecology

## 2024-09-19 ENCOUNTER — Ambulatory Visit

## 2024-09-19 VITALS — BP 115/74 | HR 82 | Wt 180.4 lb

## 2024-09-19 DIAGNOSIS — Z113 Encounter for screening for infections with a predominantly sexual mode of transmission: Secondary | ICD-10-CM | POA: Insufficient documentation

## 2024-09-19 NOTE — Progress Notes (Signed)
..  SUBJECTIVE:  33 y.o. female is in the office for STD testing, reports new partner. Denies abnormal vaginal bleeding or significant pelvic pain or fever. No UTI symptoms. Denies history of known exposure to STD.  No LMP recorded. Patient has had an injection.  OBJECTIVE:  She appears well, afebrile. Urine dipstick: not done.  ASSESSMENT:  STD Screening New Partner  PLAN:  GC, chlamydia, trichomonas, BVAG, CVAG probe sent to lab. Treatment: To be determined once lab results are received ROV prn if symptoms persist or worsen.

## 2024-09-24 LAB — CERVICOVAGINAL ANCILLARY ONLY
Candida Glabrata: NEGATIVE
Candida Vaginitis: NEGATIVE
Chlamydia: NEGATIVE
Comment: NEGATIVE
Comment: NEGATIVE
Comment: NEGATIVE
Comment: NEGATIVE
Comment: NORMAL
Neisseria Gonorrhea: NEGATIVE
Trichomonas: NEGATIVE

## 2024-09-26 ENCOUNTER — Ambulatory Visit: Payer: Self-pay | Admitting: Obstetrics and Gynecology

## 2024-10-28 ENCOUNTER — Other Ambulatory Visit (HOSPITAL_COMMUNITY)
Admission: RE | Admit: 2024-10-28 | Discharge: 2024-10-28 | Disposition: A | Discharge status: Home / Self Care | Source: Ambulatory Visit | Attending: Obstetrics and Gynecology | Admitting: Obstetrics and Gynecology

## 2024-10-28 ENCOUNTER — Ambulatory Visit (INDEPENDENT_AMBULATORY_CARE_PROVIDER_SITE_OTHER): Payer: Self-pay

## 2024-10-28 VITALS — BP 113/77 | HR 90 | Wt 182.0 lb

## 2024-10-28 DIAGNOSIS — N898 Other specified noninflammatory disorders of vagina: Secondary | ICD-10-CM

## 2024-10-28 DIAGNOSIS — R350 Frequency of micturition: Secondary | ICD-10-CM | POA: Diagnosis not present

## 2024-10-28 LAB — POCT URINALYSIS DIPSTICK
Bilirubin, UA: NEGATIVE
Blood, UA: NEGATIVE
Glucose, UA: NEGATIVE
Ketones, UA: NEGATIVE
Nitrite, UA: NEGATIVE
Odor: NEGATIVE
Protein, UA: NEGATIVE
Spec Grav, UA: 1.01
Urobilinogen, UA: 0.2 U/dL
pH, UA: 5

## 2024-10-28 NOTE — Progress Notes (Signed)
..  SUBJECTIVE:  33 y.o. female complains of vaginal discharge and odor for 4 day(s). Pt reports frequent urination with some pressure. Pt was previously on office on 09/19/24 for swab and culture results were insufficient for BV. Denies abnormal vaginal bleeding or significant pelvic pain or fever. Denies history of known exposure to STD.  No LMP recorded. Patient has had an injection.  OBJECTIVE:  She appears well, afebrile. Urine dipstick: positive for leukocytes.  ASSESSMENT:  Vaginal Discharge  Vaginal Odor   PLAN:  GC, chlamydia, trichomonas, BVAG, CVAG probe and urine culture sent to lab. Treatment: To be determined once lab results are received ROV prn if symptoms persist or worsen.

## 2024-10-29 ENCOUNTER — Other Ambulatory Visit: Payer: Self-pay

## 2024-10-29 LAB — CERVICOVAGINAL ANCILLARY ONLY
Bacterial Vaginitis (gardnerella): POSITIVE — AB
Candida Glabrata: NEGATIVE
Candida Vaginitis: POSITIVE — AB
Chlamydia: NEGATIVE
Comment: NEGATIVE
Comment: NEGATIVE
Comment: NEGATIVE
Comment: NEGATIVE
Comment: NEGATIVE
Comment: NORMAL
Neisseria Gonorrhea: NEGATIVE
Trichomonas: NEGATIVE

## 2024-10-29 MED ORDER — FLUCONAZOLE 150 MG PO TABS: 150.0000 mg | ORAL_TABLET | Freq: Once | ORAL | 0 refills | Status: AC

## 2024-10-29 MED ORDER — METRONIDAZOLE 0.75 % VA GEL: 1.0000 | Freq: Every day | VAGINAL | 0 refills | Status: AC

## 2024-11-01 ENCOUNTER — Ambulatory Visit: Payer: Self-pay | Admitting: Obstetrics and Gynecology

## 2024-11-01 LAB — URINE CULTURE

## 2024-11-01 MED ORDER — FLUCONAZOLE 150 MG PO TABS: 150.0000 mg | ORAL_TABLET | Freq: Once | ORAL | 0 refills | Status: AC

## 2024-11-01 MED ORDER — METRONIDAZOLE 500 MG PO TABS: 500.0000 mg | ORAL_TABLET | Freq: Two times a day (BID) | ORAL | 0 refills | Status: AC

## 2024-11-01 MED ORDER — NITROFURANTOIN MONOHYD MACRO 100 MG PO CAPS: 100.0000 mg | ORAL_CAPSULE | Freq: Two times a day (BID) | ORAL | 1 refills | Status: AC

## 2024-12-18 ENCOUNTER — Ambulatory Visit: Admitting: Podiatry
